# Patient Record
Sex: Male | Born: 1940
Health system: Southern US, Community
[De-identification: ages and names within clinical notes are randomized; demographics above are authoritative.]

## PROBLEM LIST (undated history)

## (undated) DIAGNOSIS — I350 Nonrheumatic aortic (valve) stenosis: Secondary | ICD-10-CM

## (undated) DIAGNOSIS — K219 Gastro-esophageal reflux disease without esophagitis: Secondary | ICD-10-CM

## (undated) DIAGNOSIS — J45909 Unspecified asthma, uncomplicated: Secondary | ICD-10-CM

## (undated) DIAGNOSIS — E785 Hyperlipidemia, unspecified: Secondary | ICD-10-CM

## (undated) DIAGNOSIS — G459 Transient cerebral ischemic attack, unspecified: Secondary | ICD-10-CM

## (undated) DIAGNOSIS — I2510 Atherosclerotic heart disease of native coronary artery without angina pectoris: Secondary | ICD-10-CM

## (undated) DIAGNOSIS — I10 Essential (primary) hypertension: Secondary | ICD-10-CM

## (undated) HISTORY — DX: Unspecified asthma, uncomplicated: J45.909

## (undated) HISTORY — DX: Nonrheumatic aortic (valve) stenosis: I35.0

## (undated) HISTORY — DX: Essential (primary) hypertension: I10

## (undated) HISTORY — PX: OTHER SURGICAL HISTORY: SHX169

## (undated) HISTORY — PX: ESOPHAGOGASTRODUODENOSCOPY: SHX1529

## (undated) HISTORY — DX: Atherosclerotic heart disease of native coronary artery without angina pectoris: I25.10

---

## 2002-03-27 ENCOUNTER — Encounter: Payer: Self-pay | Admitting: Specialist

## 2002-03-27 ENCOUNTER — Ambulatory Visit (HOSPITAL_COMMUNITY): Admission: RE | Admit: 2002-03-27 | Discharge: 2002-03-27 | Payer: Self-pay | Admitting: Specialist

## 2009-02-10 ENCOUNTER — Ambulatory Visit: Payer: Self-pay | Admitting: Cardiology

## 2009-02-11 ENCOUNTER — Encounter: Payer: Self-pay | Admitting: Cardiology

## 2009-02-12 ENCOUNTER — Ambulatory Visit: Payer: Self-pay | Admitting: Cardiology

## 2009-02-12 ENCOUNTER — Encounter: Payer: Self-pay | Admitting: Cardiology

## 2009-02-12 ENCOUNTER — Inpatient Hospital Stay (HOSPITAL_COMMUNITY): Admission: AD | Admit: 2009-02-12 | Discharge: 2009-02-13 | Payer: Self-pay | Admitting: Internal Medicine

## 2009-02-13 ENCOUNTER — Encounter: Payer: Self-pay | Admitting: Cardiology

## 2009-02-13 ENCOUNTER — Encounter: Payer: Self-pay | Admitting: Internal Medicine

## 2009-02-27 DIAGNOSIS — E785 Hyperlipidemia, unspecified: Secondary | ICD-10-CM | POA: Insufficient documentation

## 2009-02-27 DIAGNOSIS — I10 Essential (primary) hypertension: Secondary | ICD-10-CM | POA: Insufficient documentation

## 2009-02-27 DIAGNOSIS — R079 Chest pain, unspecified: Secondary | ICD-10-CM

## 2009-02-28 ENCOUNTER — Ambulatory Visit: Payer: Self-pay | Admitting: Cardiology

## 2010-02-25 NOTE — Assessment & Plan Note (Signed)
Summary: eph-per matt 2 wk fu dsch cone   Visit Type:  Follow-up Primary Provider:  Dr. Doreen Beam  CC:  chest pain.  History of Present Illness: Patient is seen post hospitalization.  I had seen him at [facility].  He had some chest discomfort.  His echo raised the question of a very slight wall motion abnormality.  Nuclear scan also raise the question of the possibility of a small inferior scar.  With these findings, decision was made to proceed with cardiac catheterization.  This was done at [facility] and there was no obstructive disease.  Patient does have mild plaque and some calcification of the LAD.  CT Scan was done to be completed to rule out pulmonary embolus.  There was some motion but no large pulmonary emboli were seen.  Patient is feeling well and he returns for post hospital followup.  His catheter site in his right leg is causing no difficulties.  Preventive Screening-Counseling & Management  Alcohol-Tobacco     Smoking Status: never  Current Medications (verified): 1)  Aspirin 81 Mg Tbec (Aspirin) .... Take One Tablet By Mouth Daily 2)  Amitriptyline Hcl 10 Mg Tabs (Amitriptyline Hcl) .... Take 1-3 Tablets By Mouth At Bedtime As Needed 3)  Lisinopril 20 Mg Tabs (Lisinopril) .... Take One Tablet By Mouth Daily 4)  Simvastatin 20 Mg Tabs (Simvastatin) .... Take One Tablet By Mouth Daily At Bedtime 5)  Multivitamins  Tabs (Multiple Vitamin) .... Take 1 Tablet By Mouth Once A Day 6)  Fish Oil 1200 Mg Caps (Omega-3 Fatty Acids) .... Take 1 Capsule By Mouth Once A Day  Allergies: 1)  ! Vancomycin  Comments:  Nurse/Medical Assistant: The patient's medications were reviewed with the patient's caretaker and were updated in the Medication List. Pt verbally confirmed medications.  Cyril Loosen, RN, BSN (February 28, 2009 12:37 PM)  Past History:  Past Medical History: Last updated: 02/28/2009 Chest pains...[facility]... nuclear.  January, 2011.Marland Kitchen artifact.. possible  inferior scar in period for ischemia.Marland Kitchen  /   [facility]....catheterization... February 12, 2009.. calcified LAD.Marland Kitchen. scattered nonobstructive disease EF 55%... echo... February 12, 2009.. mild hypokinesis inferior septal Aortic valve sclerosis.Marland Kitchen. echo... January, 2011 Hyperlipidemia Hypertension CT scan.. chest... February 13, 2009... image degraded by motion.... no large pulmonary emboli  Social History: Smoking Status:  never  Review of Systems       Patient denies fever, chills, headache, sweats, rash, change in vision, change in hearing, chest pain, cough, nausea vomiting, urinary symptoms, musculoskeletal problems.  All other systems are reviewed and are negative.  Vital Signs:  Patient profile:   70 year old male Height:      69 inches Weight:      218 pounds BMI:     32.31 Pulse rate:   62 / minute BP sitting:   151 / 82  (left arm) Cuff size:   regular  Vitals Entered By: Cyril Loosen, RN, BSN (February 28, 2009 12:34 PM)  Nutrition Counseling: Patient's BMI is greater than 25 and therefore counseled on weight management options.  CC: chest pain Comments Post hospital follow up   Physical Exam  General:  patient is stable in general today. Eyes:  no xanthelasma. Neck:  no jugular venous distention. Lungs:  lungs are clear  Effort is not labored. Heart:  cardiac exam reveals an S1-S2.  No clicks or significant murmurs or Abdomen:  abdomen soft. Extremities:  no peripheral edema. Psych:  patient is oriented to person time  and place.  Affect is normal.   Impression & Recommendations:  Problem # 1:  HYPERTENSION (ICD-401.9)  His updated medication list for this problem includes:    Aspirin 81 Mg Tbec (Aspirin) .Marland Kitchen... Take one tablet by mouth daily    Lisinopril 20 Mg Tabs (Lisinopril) .Marland Kitchen... Take one tablet by mouth daily Systolic blood pressure is mildly elevated today.  He will follow up with his primary physician.  Problem # 2:  CHEST PAIN UNSPECIFIED  (ICD-786.50)  His updated medication list for this problem includes:    Aspirin 81 Mg Tbec (Aspirin) .Marland Kitchen... Take one tablet by mouth daily    Lisinopril 20 Mg Tabs (Lisinopril) .Marland Kitchen... Take one tablet by mouth daily The chest pain that the patient had appears not to have been cardiac.  Further workup is not needed at this time.  He does have some mild plaquing and also some calcification of the LAD.  Aggressive secondary prevention will be appropriate and I discussed this with him.  Problem # 3:  HYPERLIPIDEMIA (ICD-272.4)  His updated medication list for this problem includes:    Simvastatin 20 Mg Tabs (Simvastatin) .Marland Kitchen... Take one tablet by mouth daily at bedtime Patient is on appropriate medications for his lipids. I have not scheduled followup for the patient.  If he develops any difficulties we be happy to see him at any time.  Patient Instructions: 1)  Your physician recommends that you continue on your current medications as directed. Please refer to the Current Medication list given to you today. 2)  No follow up needed.

## 2010-02-25 NOTE — Miscellaneous (Signed)
  Clinical Lists Changes  Observations: Added new observation of PAST MED HX: Chest pains...MM [facility]... nuclear.  January, 2011.Marland Kitchen artifact.. possible inferior scar in period for ischemia.Marland Kitchen  /   [facility]....catheterization... February 12, 2009.. calcified LAD.Marland Kitchen. scattered nonobstructive disease EF 55%... echo... February 12, 2009.. mild hypokinesis inferior septal Aortic valve sclerosis.Marland Kitchen. echo... January, 2011 Hyperlipidemia Hypertension CT scan.. chest... February 13, 2009... image degraded by motion.... no large pulmonary emboli (02/28/2009 8:24)       Past History:  Past Medical History: Chest pains...MM H... nuclear.  January, 2011.Marland Kitchen artifact.. possible inferior scar in period for ischemia.Marland Kitchen  /   [facility]....catheterization... February 12, 2009.. calcified LAD.Marland Kitchen. scattered nonobstructive disease EF 55%... echo... February 12, 2009.. mild hypokinesis inferior septal Aortic valve sclerosis.Marland Kitchen. echo... January, 2011 Hyperlipidemia Hypertension CT scan.. chest... February 13, 2009... image degraded by motion.... no large pulmonary emboli

## 2010-02-25 NOTE — Consult Note (Signed)
Summary: CARDIOLOGY CONSULT/ [facility]  CARDIOLOGY CONSULT/ [facility]   Imported By: Zachary George 02/28/2009 09:20:08  _____________________________________________________________________  External Attachment:    Type:   Image     Comment:   External Document

## 2010-04-14 LAB — BASIC METABOLIC PANEL
BUN: 8 mg/dL (ref 6–23)
Chloride: 106 mEq/L (ref 96–112)
GFR calc non Af Amer: >60 mL/min (ref >60)
Glucose, Bld: 143 mg/dL — ABNORMAL HIGH (ref 70–99)
Potassium: 4.2 mEq/L (ref 3.5–5.1)

## 2010-04-14 LAB — D-DIMER, QUANTITATIVE: D-Dimer, Quant: 1.23 ug/mL-FEU — ABNORMAL HIGH (ref 0.00–0.48)

## 2010-09-30 MED ORDER — SODIUM CHLORIDE 0.45 % IV SOLN
Freq: Once | INTRAVENOUS | Status: AC
  Administered 2010-10-01: 07:00:00 via INTRAVENOUS

## 2010-10-01 ENCOUNTER — Other Ambulatory Visit (INDEPENDENT_AMBULATORY_CARE_PROVIDER_SITE_OTHER): Payer: Self-pay | Admitting: Internal Medicine

## 2010-10-01 ENCOUNTER — Encounter (HOSPITAL_COMMUNITY): Payer: Self-pay | Admitting: *Deleted

## 2010-10-01 ENCOUNTER — Encounter (HOSPITAL_COMMUNITY)
Admission: RE | Disposition: A | Discharge status: Home / Self Care | Payer: Self-pay | Source: Ambulatory Visit | Attending: Internal Medicine

## 2010-10-01 ENCOUNTER — Encounter (INDEPENDENT_AMBULATORY_CARE_PROVIDER_SITE_OTHER): Payer: Self-pay | Admitting: Internal Medicine

## 2010-10-01 ENCOUNTER — Ambulatory Visit (HOSPITAL_COMMUNITY)
Admission: RE | Admit: 2010-10-01 | Discharge: 2010-10-01 | Disposition: A | Discharge status: Home / Self Care | Payer: Medicare Other | Source: Ambulatory Visit | Attending: Internal Medicine | Admitting: Internal Medicine

## 2010-10-01 DIAGNOSIS — Z7982 Long term (current) use of aspirin: Secondary | ICD-10-CM | POA: Insufficient documentation

## 2010-10-01 DIAGNOSIS — D126 Benign neoplasm of colon, unspecified: Secondary | ICD-10-CM | POA: Insufficient documentation

## 2010-10-01 DIAGNOSIS — K6389 Other specified diseases of intestine: Secondary | ICD-10-CM

## 2010-10-01 DIAGNOSIS — I10 Essential (primary) hypertension: Secondary | ICD-10-CM | POA: Insufficient documentation

## 2010-10-01 DIAGNOSIS — K5730 Diverticulosis of large intestine without perforation or abscess without bleeding: Secondary | ICD-10-CM | POA: Insufficient documentation

## 2010-10-01 DIAGNOSIS — Z79899 Other long term (current) drug therapy: Secondary | ICD-10-CM | POA: Insufficient documentation

## 2010-10-01 DIAGNOSIS — Z1211 Encounter for screening for malignant neoplasm of colon: Secondary | ICD-10-CM | POA: Insufficient documentation

## 2010-10-01 DIAGNOSIS — D175 Benign lipomatous neoplasm of intra-abdominal organs: Secondary | ICD-10-CM | POA: Insufficient documentation

## 2010-10-01 DIAGNOSIS — E785 Hyperlipidemia, unspecified: Secondary | ICD-10-CM | POA: Insufficient documentation

## 2010-10-01 HISTORY — PX: COLONOSCOPY: SHX5424

## 2010-10-01 HISTORY — DX: Hyperlipidemia, unspecified: E78.5

## 2010-10-01 HISTORY — DX: Essential (primary) hypertension: I10

## 2010-10-01 SURGERY — COLONOSCOPY
Anesthesia: Moderate Sedation

## 2010-10-01 MED ORDER — MEPERIDINE HCL 50 MG/ML IJ SOLN
INTRAMUSCULAR | Status: DC | PRN
  Administered 2010-10-01 (×2): 25 mg via INTRAVENOUS

## 2010-10-01 MED ORDER — MEPERIDINE HCL 50 MG/ML IJ SOLN
INTRAMUSCULAR | Status: AC
  Filled 2010-10-01: qty 1

## 2010-10-01 MED ORDER — MIDAZOLAM HCL 5 MG/5ML IJ SOLN
INTRAMUSCULAR | Status: AC
  Filled 2010-10-01: qty 10

## 2010-10-01 MED ORDER — STERILE WATER FOR IRRIGATION IR SOLN
Status: DC | PRN
  Administered 2010-10-01: 08:00:00

## 2010-10-01 MED ORDER — MIDAZOLAM HCL 5 MG/5ML IJ SOLN
INTRAMUSCULAR | Status: DC | PRN
  Administered 2010-10-01 (×2): 2 mg via INTRAVENOUS

## 2010-10-01 NOTE — Op Note (Signed)
COLONOSCOPY PROCEDURE REPORT  PATIENT:  Shane Osborne  MR#:  [id number] Birthdate:  08-Dec-1940, 70 y.o., male Endoscopist:  Dr. Malissa Hippo, MD Referred By:  Dub Mikes, MD Procedure Date: 10/01/2010  Procedure:   Colonoscopy  Indications:  Average risk screening colonoscopy. Last exam was 10 years ago.  Informed Consent: Procedure and risks were reviewed with the patient. Questions were answered and informed consent was obtained   Medications:  Demerol 50 mg IV Versed 4 mg IV  Description of procedure:  After a digital rectal exam was performed, that colonoscope was advanced from the anus through the rectum and colon to the area of the cecum, ileocecal valve and appendiceal orifice. The cecum was deeply intubated. These structures were well-seen and photographed for the record. From the level of the cecum and ileocecal valve, the scope was slowly and cautiously withdrawn. The mucosal surfaces were carefully surveyed utilizing scope tip to flexion to facilitate fold flattening as needed. The scope was pulled down into the rectum where a thorough exam including retroflexion was performed.  Findings:   Prep excellent. 5 mm polyp ablated via cold biopsy from the cecum 3 mm polyp ablated via cold biopsy from hepatic flexure. 2 small diverticula at transverse colon. 13 mm submucosal lipoma at the descending colon  Therapeutic/Diagnostic Maneuvers Performed:  See above  Complications:  None  Cecal Withdrawal Time:  16 minutes  Impression:  Examination performed to cecum. 2 small diverticula at transverse colon. 2 small polyps ablated via cold biopsy; one  from the cecum was 5 mm second one was 3 mm located athepatic flexure.  Submucosal lipoma at ascending colon.  Recommendations:  Standard  instructions given. High fiber diet. Physician will contact patient with results of biopsy.  Shane Osborne U  10/01/2010 8:06 AM  CC: Dr. Ignatius Specking., MD, MD & Dr. Bonnetta Barry ref. provider  found

## 2010-10-01 NOTE — H&P (Signed)
Shane Osborne is an 70 y.o. male.   Chief Complaint: Patient is here for colonoscopy. HPI: Patient is 70 year old Caucasian male who is here for screening colonoscopy. His last exam with 10 years ago and was normal. He denies abdominal pain recent change bowel habits. Family history is negative for colorectal carcinoma.  Past Medical History  Diagnosis Date  . Hypertension   . Hyperlipidemia     Past Surgical History  Procedure Date  . Right leg 20 years ago    X 8 secondary to fracture    History reviewed. No pertinent family history. Social History:  reports that he has never smoked. He does not have any smokeless tobacco history on file. His alcohol and drug histories not on file.  Allergies:  Allergies  Allergen Reactions  . Vancomycin     Medications Prior to Admission  Medication Dose Route Frequency Provider Last Rate Last Dose  . 0.45 % sodium chloride infusion   Intravenous Once Malissa Hippo, MD 20 mL/hr at 10/01/10 0718    . meperidine (DEMEROL) 50 MG/ML injection           . midazolam (VERSED) 5 MG/5ML injection            Medications Prior to Admission  Medication Sig Dispense Refill  . aspirin 81 MG tablet Take 81 mg by mouth daily.        Marland Kitchen lisinopril (PRINIVIL,ZESTRIL) 20 MG tablet Take 20 mg by mouth daily.        . simvastatin (ZOCOR) 20 MG tablet Take 20 mg by mouth at bedtime.          No results found for this or any previous visit (from the past 48 hour(s)). No results found.  Review of Systems  Constitutional: Negative for weight loss.  Gastrointestinal: Negative for abdominal pain, diarrhea, constipation, blood in stool and melena.    Blood pressure 143/82, pulse 87, temperature 97.7 F (36.5 C), temperature source Oral, resp. rate 18, height 5' 9.5" (1.765 m), weight 220 lb (99.791 kg), SpO2 95.00%. Physical Exam  Constitutional: He appears well-developed and well-nourished.  HENT:  Mouth/Throat: Oropharynx is clear and moist.  Eyes:  Conjunctivae are normal. No scleral icterus.  Neck: No thyromegaly present.  Cardiovascular: Normal rate, regular rhythm and normal heart sounds.   No murmur heard. Respiratory: Effort normal and breath sounds normal.  GI: Soft. He exhibits no distension and no mass. There is no tenderness.  Musculoskeletal: He exhibits no edema.  Lymphadenopathy:    He has no cervical adenopathy.  Neurological: He is alert.  Skin: Skin is warm and dry.     Assessment/Plan Average risk screening colonoscopy.  REHMAN,NAJEEB U 10/01/2010, 7:35 AM

## 2010-10-08 ENCOUNTER — Encounter (HOSPITAL_COMMUNITY): Payer: Self-pay | Admitting: Internal Medicine

## 2010-10-17 ENCOUNTER — Encounter (INDEPENDENT_AMBULATORY_CARE_PROVIDER_SITE_OTHER): Payer: Self-pay | Admitting: *Deleted

## 2013-08-22 ENCOUNTER — Ambulatory Visit (INDEPENDENT_AMBULATORY_CARE_PROVIDER_SITE_OTHER): Payer: Medicare Other | Admitting: Internal Medicine

## 2013-08-22 ENCOUNTER — Encounter (INDEPENDENT_AMBULATORY_CARE_PROVIDER_SITE_OTHER): Payer: Self-pay | Admitting: Internal Medicine

## 2013-08-22 ENCOUNTER — Other Ambulatory Visit (INDEPENDENT_AMBULATORY_CARE_PROVIDER_SITE_OTHER): Payer: Self-pay | Admitting: *Deleted

## 2013-08-22 ENCOUNTER — Telehealth (INDEPENDENT_AMBULATORY_CARE_PROVIDER_SITE_OTHER): Payer: Self-pay | Admitting: *Deleted

## 2013-08-22 VITALS — BP 132/70 | HR 60 | Temp 97.8°F | Ht 69.5 in | Wt 223.5 lb

## 2013-08-22 DIAGNOSIS — R195 Other fecal abnormalities: Secondary | ICD-10-CM | POA: Insufficient documentation

## 2013-08-22 DIAGNOSIS — Z1211 Encounter for screening for malignant neoplasm of colon: Secondary | ICD-10-CM

## 2013-08-22 NOTE — Patient Instructions (Signed)
Colonoscopy.  The risks and benefits such as perforation, bleeding, and infection were reviewed with the patient and is agreeable. 

## 2013-08-22 NOTE — Progress Notes (Signed)
Subjective:     Patient ID: Shane Osborne, male   DOB: September 25, 1940, 73 y.o.   MRN: [id number]  HPI Referred to our office for a positive stool card per Dr. Woody Seller. 08/09/2013. Patient denies seeing any blood. No abdominal pain.  He did have some constipation before his hernia surgery in May Appetite is good. No weight loss.  Has a BM daily. No melena or BRRB.   08/09/2013 H and H 14.7 and 44.2, MCV 94, platelet 272  10/01/2010 Colonoscopy:  Findings:  Prep excellent.  5 mm polyp ablated via cold biopsy from the cecum  3 mm polyp ablated via cold biopsy from hepatic flexure.  2 small diverticula at transverse colon.  13 mm submucosal lipoma at the descending colon   Notes Recorded by Rogene Houston, MD on 10/12/2010 at 12:11 PM Cecal polyp is an adenoma and the other one is not. I have reviewed results the patient's wife; He can wait 7 years before his next colonoscopy.   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    Past Medical History  Diagnosis Date  . Hypertension   . Hyperlipidemia     Past Surgical History  Procedure Laterality Date  . Right leg  20 years ago    X 8 secondary to fracture  . Colonoscopy  10/01/2010    Procedure: COLONOSCOPY;  Surgeon: Rogene Houston, MD;  Location: [facility];  Service: Endoscopy;  Laterality: N/A;  . Rt inguinal       hernia repair 2 months ago    Allergies  Allergen Reactions  . Vancomycin     Current Outpatient Prescriptions on File Prior to Visit  Medication Sig Dispense Refill  . aspirin 81 MG tablet Take 81 mg by mouth daily.        Marland Kitchen lisinopril (PRINIVIL,ZESTRIL) 20 MG tablet Take 10 mg by mouth daily.       . simvastatin (ZOCOR) 20 MG tablet Take 10 mg by mouth at bedtime.        No current facility-administered medications on file prior to visit.     Objective:   Physical Exam Filed Vitals:   08/22/13 1037  BP: 132/70  Pulse: 60  Temp: 97.8 F (36.6 C)  Height: 5' 9.5" (1.765 m)  Weight: 223  lb 8 oz (101.379 kg)   Alert and oriented. Skin warm and dry. Oral mucosa is moist.   . Sclera anicteric, conjunctivae is pink. Thyroid not enlarged. No cervical lymphadenopathy. Lungs clear. Heart regular rate and rhythm.  Abdomen is soft. Bowel sounds are positive. No hepatomegaly. No abdominal masses felt. No tenderness.  No edema to lower extremities.  Stool brown and guaiac negative.     Assessment:     Heme positive stool. I discussed with Dr. Laural Golden. Colonic neoplasm needs to be ruled out.    Plan:     Colonoscopy.The risks and benefits such as perforation, bleeding, and infection were reviewed with the patient and is agreeable.

## 2013-08-22 NOTE — Telephone Encounter (Signed)
Patient needs movi prep 

## 2013-08-23 MED ORDER — PEG-KCL-NACL-NASULF-NA ASC-C 100 G PO SOLR: 1.0000 | Freq: Once | ORAL | Status: DC

## 2013-08-25 ENCOUNTER — Encounter (HOSPITAL_COMMUNITY): Payer: Self-pay | Admitting: Pharmacy Technician

## 2013-08-28 ENCOUNTER — Telehealth (INDEPENDENT_AMBULATORY_CARE_PROVIDER_SITE_OTHER): Payer: Self-pay | Admitting: *Deleted

## 2013-08-28 DIAGNOSIS — Z1211 Encounter for screening for malignant neoplasm of colon: Secondary | ICD-10-CM

## 2013-08-28 NOTE — Telephone Encounter (Signed)
Patient states pharmacy didn't get it

## 2013-08-29 MED ORDER — PEG-KCL-NACL-NASULF-NA ASC-C 100 G PO SOLR: 1.0000 | Freq: Once | ORAL | Status: DC

## 2013-09-01 ENCOUNTER — Encounter (HOSPITAL_COMMUNITY)
Admission: RE | Disposition: A | Discharge status: Home / Self Care | Payer: Self-pay | Source: Ambulatory Visit | Attending: Internal Medicine

## 2013-09-01 ENCOUNTER — Ambulatory Visit (HOSPITAL_COMMUNITY)
Admission: RE | Admit: 2013-09-01 | Discharge: 2013-09-01 | Disposition: A | Discharge status: Home / Self Care | Payer: Medicare Other | Source: Ambulatory Visit | Attending: Internal Medicine | Admitting: Internal Medicine

## 2013-09-01 ENCOUNTER — Encounter (HOSPITAL_COMMUNITY): Payer: Self-pay | Admitting: *Deleted

## 2013-09-01 DIAGNOSIS — Z860100 Personal history of colon polyps, unspecified: Secondary | ICD-10-CM | POA: Insufficient documentation

## 2013-09-01 DIAGNOSIS — E785 Hyperlipidemia, unspecified: Secondary | ICD-10-CM | POA: Insufficient documentation

## 2013-09-01 DIAGNOSIS — Z79899 Other long term (current) drug therapy: Secondary | ICD-10-CM | POA: Insufficient documentation

## 2013-09-01 DIAGNOSIS — K921 Melena: Secondary | ICD-10-CM

## 2013-09-01 DIAGNOSIS — R195 Other fecal abnormalities: Secondary | ICD-10-CM

## 2013-09-01 DIAGNOSIS — D126 Benign neoplasm of colon, unspecified: Secondary | ICD-10-CM | POA: Insufficient documentation

## 2013-09-01 DIAGNOSIS — Z7982 Long term (current) use of aspirin: Secondary | ICD-10-CM | POA: Insufficient documentation

## 2013-09-01 DIAGNOSIS — Z881 Allergy status to other antibiotic agents status: Secondary | ICD-10-CM | POA: Diagnosis not present

## 2013-09-01 DIAGNOSIS — D175 Benign lipomatous neoplasm of intra-abdominal organs: Secondary | ICD-10-CM | POA: Diagnosis not present

## 2013-09-01 DIAGNOSIS — I10 Essential (primary) hypertension: Secondary | ICD-10-CM | POA: Diagnosis not present

## 2013-09-01 DIAGNOSIS — K644 Residual hemorrhoidal skin tags: Secondary | ICD-10-CM | POA: Insufficient documentation

## 2013-09-01 DIAGNOSIS — K5730 Diverticulosis of large intestine without perforation or abscess without bleeding: Secondary | ICD-10-CM | POA: Diagnosis not present

## 2013-09-01 HISTORY — PX: COLONOSCOPY: SHX5424

## 2013-09-01 SURGERY — COLONOSCOPY
Anesthesia: Moderate Sedation

## 2013-09-01 MED ORDER — STERILE WATER FOR IRRIGATION IR SOLN
Status: DC | PRN
  Administered 2013-09-01: 09:00:00

## 2013-09-01 MED ORDER — MEPERIDINE HCL 50 MG/ML IJ SOLN
INTRAMUSCULAR | Status: AC
  Filled 2013-09-01: qty 1

## 2013-09-01 MED ORDER — MEPERIDINE HCL 50 MG/ML IJ SOLN
INTRAMUSCULAR | Status: DC | PRN
  Administered 2013-09-01 (×2): 25 mg via INTRAVENOUS

## 2013-09-01 MED ORDER — MIDAZOLAM HCL 5 MG/5ML IJ SOLN
INTRAMUSCULAR | Status: AC
  Filled 2013-09-01: qty 10

## 2013-09-01 MED ORDER — SODIUM CHLORIDE 0.9 % IV SOLN
INTRAVENOUS | Status: DC
  Administered 2013-09-01: 09:00:00 via INTRAVENOUS

## 2013-09-01 MED ORDER — MIDAZOLAM HCL 5 MG/5ML IJ SOLN
INTRAMUSCULAR | Status: DC | PRN
  Administered 2013-09-01: 1 mg via INTRAVENOUS
  Administered 2013-09-01 (×3): 2 mg via INTRAVENOUS

## 2013-09-01 NOTE — H&P (Signed)
Shane Osborne is an 73 y.o. male.   Chief Complaint: Patient is here for colonoscopy. HPI: Patient is 73 year old Caucasian male with history of colonic adenoma and was noted to have heme-positive stool by his primary care physician Dr. Woody Osborne. He denies abdominal pain change in bowel habits melena or rectal bleeding. He does not take OTC NSAIDs. He had right inguinal herniorrhaphy two months ago which was uneventful. Family history is negative for CRC for  Past Medical History  Diagnosis Date  . Hypertension   . Hyperlipidemia     Past Surgical History  Procedure Laterality Date  . Right leg  20 years ago    X 8 secondary to fracture  . Colonoscopy  10/01/2010    Procedure: COLONOSCOPY;  Surgeon: Shane Houston, MD;  Location: [facility];  Service: Endoscopy;  Laterality: N/A;  . Rt inguinal       hernia repair 2 months ago    History reviewed. No pertinent family history. Social History:  reports that he has never smoked. He does not have any smokeless tobacco history on file. He reports that he drinks about 3 ounces of alcohol per week. His drug history is not on file.  Allergies:  Allergies  Allergen Reactions  . Vancomycin     Medications Prior to Admission  Medication Sig Dispense Refill  . aspirin 81 MG tablet Take 81 mg by mouth daily.        Marland Kitchen lisinopril (PRINIVIL,ZESTRIL) 20 MG tablet Take 10 mg by mouth daily.       . Multiple Vitamin (MULTIVITAMIN) tablet Take 1 tablet by mouth daily.      . Omega-3 Fatty Acids (FISH OIL) 1200 MG CAPS Take by mouth 2 (two) times daily before a meal.      . peg 3350 powder (MOVIPREP) 100 G SOLR Take 1 kit (200 g total) by mouth once.  1 kit  0  . simvastatin (ZOCOR) 20 MG tablet Take 10 mg by mouth at bedtime.         No results found for this or any previous visit (from the past 48 hour(s)). No results found.  ROS  Blood pressure 158/122, pulse 88, temperature 98 F (36.7 C), resp. rate 13, SpO2 97.00%. Physical Exam   Constitutional: He appears well-developed and well-nourished.  HENT:  Mouth/Throat: Oropharynx is clear and moist.  Eyes: Conjunctivae are normal. No scleral icterus.  Neck: No thyromegaly present.  Cardiovascular: Normal rate, regular rhythm and normal heart sounds.   No murmur heard. Respiratory: Effort normal and breath sounds normal.  GI: Soft. He exhibits no distension. There is no tenderness.  Musculoskeletal: He exhibits no edema.  Lymphadenopathy:    He has no cervical adenopathy.  Neurological: He is alert.  Skin: Skin is warm and dry.     Assessment/Plan Heme-positive stool. History of colonic adenoma. Diagnostic colonoscopy.  ShaneNAJEEB Osborne 09/01/2013, 9:30 AM

## 2013-09-01 NOTE — Discharge Instructions (Signed)
Resume usual medications and diet. °No driving for 24 hours. °Physician will call with biopsy results. ° °Colonoscopy, Care After °Refer to this sheet in the next few weeks. These instructions provide you with information on caring for yourself after your procedure. Your health care provider may also give you more specific instructions. Your treatment has been planned according to current medical practices, but problems sometimes occur. Call your health care provider if you have any problems or questions after your procedure. °WHAT TO EXPECT AFTER THE PROCEDURE  °After your procedure, it is typical to have the following: °· A small amount of blood in your stool. °· Moderate amounts of gas and mild abdominal cramping or bloating. °HOME CARE INSTRUCTIONS °· Do not drive, operate machinery, or sign important documents for 24 hours. °· You may shower and resume your regular physical activities, but move at a slower pace for the first 24 hours. °· Take frequent rest periods for the first 24 hours. °· Walk around or put a warm pack on your abdomen to help reduce abdominal cramping and bloating. °· Drink enough fluids to keep your urine clear or pale yellow. °· You may resume your normal diet as instructed by your health care provider. Avoid heavy or fried foods that are hard to digest. °· Avoid drinking alcohol for 24 hours or as instructed by your health care provider. °· Only take over-the-counter or prescription medicines as directed by your health care provider. °· If a tissue sample (biopsy) was taken during your procedure: °¨ Do not take aspirin or blood thinners for 7 days, or as instructed by your health care provider. °¨ Do not drink alcohol for 7 days, or as instructed by your health care provider. °¨ Eat soft foods for the first 24 hours. °SEEK MEDICAL CARE IF: °You have persistent spotting of blood in your stool 2-3 days after the procedure. °SEEK IMMEDIATE MEDICAL CARE IF: °· You have more than a small  spotting of blood in your stool. °· You pass large blood clots in your stool. °· Your abdomen is swollen (distended). °· You have nausea or vomiting. °· You have a fever. °· You have increasing abdominal pain that is not relieved with medicine. °Document Released: 08/27/2003 Document Revised: 11/02/2012 Document Reviewed: 09/19/2012 °Exit[employer]® Patient Information ©2015 [employer]. This information is not intended to replace advice given to you by your health care provider. Make sure you discuss any questions you have with your health care provider. ° °

## 2013-09-01 NOTE — Op Note (Addendum)
COLONOSCOPY PROCEDURE REPORT  PATIENT:  Shane Osborne  MR#:  [id number] Birthdate:  [birth year], 73 y.o., male Endoscopist:  Dr. Rogene Houston, MD Referred By:  Dr. Glenda Chroman, MD Procedure Date: 09/01/2013  Procedure:   Colonoscopy  Indications: The patient is 73 year old Caucasian male who was recently found to have heme-positive stools. His hemoglobin was normal. He has history of colonic adenomas and  his last exam was in September, 2012. Family history is negative for CRC. He is undergoing diagnostic colonoscopy.  Informed Consent:  The procedure and risks were reviewed with the patient and informed consent was obtained.  Medications:  Demerol 50 mg IV Versed 7 mg IV  Description of procedure:  After a digital rectal exam was performed, that colonoscope was advanced from the anus through the rectum and colon to the area of the cecum, ileocecal valve and appendiceal orifice. The cecum was deeply intubated. These structures were well-seen and photographed for the record. From the level of the cecum and ileocecal valve, the scope was slowly and cautiously withdrawn. The mucosal surfaces were carefully surveyed utilizing scope tip to flexion to facilitate fold flattening as needed. The scope was pulled down into the rectum where a thorough exam including retroflexion was performed.  Findings:   Prep excellent. Three small polyps removed via cold biopsy and submitted together(cecum, hepatic flexure and sigmoid colon). Two small diverticula noted one was at the hepatic flexure and the other one at splenic flexure. Normal rectal mucosa. 13 mm submucosal lipoma and descending colon. Small hemorrhoids below the dentate line.   Therapeutic/Diagnostic Maneuvers Performed:  See above  Complications:  None  Cecal Withdrawal Time:  11 minutes  Impression:  Examination performed to cecum. Three small polyps removed via cold  biopsy and submitted together(cecum, hepatic flexure and  sigmoid colon). Two small diverticula noted(hepatic and splenic flexure). Submucosal lipoma at descending colon stable since last exam. Small external hemorrhoids.  Recommendations:  Standard instructions given. I will contact patient with biopsy results and further recommendations but he could definitely wait 5 years before his next colonoscopy. He will have H&H by Dr. Woody Seller in 3-4 months.  Rukia Mcgillivray U  09/01/2013 10:09 AM  CC: Dr. Glenda Chroman., MD & Dr. Rayne Du ref. provider found

## 2013-09-07 ENCOUNTER — Encounter (HOSPITAL_COMMUNITY): Payer: Self-pay | Admitting: Internal Medicine

## 2013-09-12 ENCOUNTER — Encounter (INDEPENDENT_AMBULATORY_CARE_PROVIDER_SITE_OTHER): Payer: Self-pay | Admitting: *Deleted

## 2014-01-12 ENCOUNTER — Encounter (INDEPENDENT_AMBULATORY_CARE_PROVIDER_SITE_OTHER): Payer: Self-pay

## 2014-03-14 ENCOUNTER — Encounter (INDEPENDENT_AMBULATORY_CARE_PROVIDER_SITE_OTHER): Payer: Self-pay | Admitting: *Deleted

## 2014-03-14 ENCOUNTER — Other Ambulatory Visit (INDEPENDENT_AMBULATORY_CARE_PROVIDER_SITE_OTHER): Payer: Self-pay | Admitting: *Deleted

## 2014-03-14 ENCOUNTER — Ambulatory Visit (INDEPENDENT_AMBULATORY_CARE_PROVIDER_SITE_OTHER): Payer: Medicare Other | Admitting: Internal Medicine

## 2014-03-14 ENCOUNTER — Encounter (INDEPENDENT_AMBULATORY_CARE_PROVIDER_SITE_OTHER): Payer: Self-pay | Admitting: Internal Medicine

## 2014-03-14 VITALS — BP 116/62 | HR 64 | Temp 97.8°F | Ht 69.5 in | Wt 222.0 lb

## 2014-03-14 DIAGNOSIS — G8929 Other chronic pain: Secondary | ICD-10-CM

## 2014-03-14 DIAGNOSIS — R110 Nausea: Secondary | ICD-10-CM

## 2014-03-14 DIAGNOSIS — R1013 Epigastric pain: Principal | ICD-10-CM

## 2014-03-14 DIAGNOSIS — R1010 Upper abdominal pain, unspecified: Secondary | ICD-10-CM

## 2014-03-14 NOTE — Progress Notes (Signed)
Subjective:    Patient ID: Shane Osborne, male    DOB: Nov 16, 1940, 74 y.o.   MRN: [id number]  HPI Presents today with c/o nausea. He says he has an uncomfortable feeling in his epigastric region. There has been no vomiting. Symptoms are really not related to eating. Symptoms occur usually in the morning and may last every day. Occurs about every day. Has tried Prilosec which really did not help.  Spicy foods really do not bother him. He denies bloating but he says his wife says he looks bloated. Symptoms for about a year. Appetite is okay. No weight loss. BMs are normal. No melena or BRRB. Walks every day. 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 His last colonoscopy was in August of last year.      09/01/2013 Colonoscopy  Indications: The patient is 74 year old Caucasian male who was recently found to have heme-positive stools. His hemoglobin was normal. He has history of colonic adenomas and his last exam was in September, 2012. Family history is negative for CRC. He is undergoing diagnostic colonoscopy. Impression:  Examination performed to cecum. Three small polyps removed via cold biopsy and submitted together(cecum, hepatic flexure and sigmoid colon). Two small diverticula noted(hepatic and splenic flexure). Submucosal lipoma at descending colon stable since last exam. Small external hemorrhoids. Notes Recorded by Rogene Houston, MD on 09/05/2013 at 1:41 PM Patient had three small polyps removed and only one is tubular adenoma Results given to patient Next colonoscopy in 5 years    Review of Systems  Married. Retired Administrator.   Past Medical History  Diagnosis Date  . Hypertension   . Hyperlipidemia     Past Surgical History  Procedure Laterality Date  . Right leg  20 years ago    X 8 secondary to fracture  . Colonoscopy  10/01/2010    Procedure: COLONOSCOPY;  Surgeon: Rogene Houston, MD;  [facility];  Service: Endoscopy;   Laterality: N/A;  . Rt inguinal       hernia repair 2 months ago  . Colonoscopy N/A 09/01/2013    Procedure: COLONOSCOPY;  Surgeon: Rogene Houston, MD;  Location: [facility];  Service: Endoscopy;  Laterality: N/A;  940    Allergies  Allergen Reactions  . Vancomycin     Current Outpatient Prescriptions on File Prior to Visit  Medication Sig Dispense Refill  . aspirin 81 MG tablet Take 81 mg by mouth daily.      Marland Kitchen lisinopril (PRINIVIL,ZESTRIL) 20 MG tablet Take 10 mg by mouth daily.     . Multiple Vitamin (MULTIVITAMIN) tablet Take 1 tablet by mouth daily.    . Omega-3 Fatty Acids (FISH OIL) 1200 MG CAPS Take by mouth 2 (two) times daily before a meal.    . simvastatin (ZOCOR) 20 MG tablet Take 10 mg by mouth at bedtime.      No current facility-administered medications on file prior to visit.        Objective:   Physical Exam  Filed Vitals:   03/14/14 0834  Height: 5' 9.5" (1.765 m)  Weight: 222 lb (100.699 kg)   Alert and oriented. Skin warm and dry. Oral mucosa is moist.   . Sclera anicteric, conjunctivae is pink. Thyroid not enlarged. No cervical lymphadenopathy. Lungs clear. Heart regular rate and rhythm.  Abdomen is soft. Bowel sounds are positive. No hepatomegaly. No abdominal masses felt. No abdominal tenderness.  No edema to lower extremities.  Assessment & Plan:  Epigastric pain, nausea. PUD needs to be ruled out.  If EGD normal, may need CT abdomen.

## 2014-03-14 NOTE — Patient Instructions (Signed)
EGD. The risks and benefits such as perforation, bleeding, and infection were reviewed with the patient and is agreeable. 

## 2014-03-16 ENCOUNTER — Encounter (INDEPENDENT_AMBULATORY_CARE_PROVIDER_SITE_OTHER): Payer: Self-pay | Admitting: *Deleted

## 2014-04-11 ENCOUNTER — Encounter (HOSPITAL_COMMUNITY)
Admission: RE | Disposition: A | Discharge status: Home / Self Care | Payer: Self-pay | Source: Ambulatory Visit | Attending: Internal Medicine

## 2014-04-11 ENCOUNTER — Ambulatory Visit (HOSPITAL_COMMUNITY)
Admission: RE | Admit: 2014-04-11 | Discharge: 2014-04-11 | Disposition: A | Discharge status: Home / Self Care | Payer: Medicare Other | Source: Ambulatory Visit | Attending: Internal Medicine | Admitting: Internal Medicine

## 2014-04-11 ENCOUNTER — Encounter (HOSPITAL_COMMUNITY): Payer: Self-pay | Admitting: *Deleted

## 2014-04-11 DIAGNOSIS — Z7982 Long term (current) use of aspirin: Secondary | ICD-10-CM | POA: Insufficient documentation

## 2014-04-11 DIAGNOSIS — K2210 Ulcer of esophagus without bleeding: Secondary | ICD-10-CM | POA: Insufficient documentation

## 2014-04-11 DIAGNOSIS — K8020 Calculus of gallbladder without cholecystitis without obstruction: Secondary | ICD-10-CM | POA: Diagnosis not present

## 2014-04-11 DIAGNOSIS — K2900 Acute gastritis without bleeding: Secondary | ICD-10-CM | POA: Diagnosis not present

## 2014-04-11 DIAGNOSIS — K2960 Other gastritis without bleeding: Secondary | ICD-10-CM | POA: Insufficient documentation

## 2014-04-11 DIAGNOSIS — R1013 Epigastric pain: Secondary | ICD-10-CM | POA: Diagnosis not present

## 2014-04-11 DIAGNOSIS — Z79899 Other long term (current) drug therapy: Secondary | ICD-10-CM | POA: Insufficient documentation

## 2014-04-11 DIAGNOSIS — G8929 Other chronic pain: Secondary | ICD-10-CM

## 2014-04-11 DIAGNOSIS — I10 Essential (primary) hypertension: Secondary | ICD-10-CM | POA: Diagnosis not present

## 2014-04-11 DIAGNOSIS — R110 Nausea: Secondary | ICD-10-CM

## 2014-04-11 DIAGNOSIS — E785 Hyperlipidemia, unspecified: Secondary | ICD-10-CM | POA: Insufficient documentation

## 2014-04-11 HISTORY — PX: ESOPHAGOGASTRODUODENOSCOPY: SHX5428

## 2014-04-11 HISTORY — DX: Gastro-esophageal reflux disease without esophagitis: K21.9

## 2014-04-11 SURGERY — EGD (ESOPHAGOGASTRODUODENOSCOPY)
Anesthesia: Moderate Sedation

## 2014-04-11 MED ORDER — MEPERIDINE HCL 50 MG/ML IJ SOLN
INTRAMUSCULAR | Status: AC
  Filled 2014-04-11: qty 1

## 2014-04-11 MED ORDER — BUTAMBEN-TETRACAINE-BENZOCAINE 2-2-14 % EX AERO
INHALATION_SPRAY | CUTANEOUS | Status: DC | PRN
  Administered 2014-04-11: 2 via TOPICAL

## 2014-04-11 MED ORDER — MIDAZOLAM HCL 5 MG/5ML IJ SOLN
INTRAMUSCULAR | Status: DC | PRN
  Administered 2014-04-11 (×2): 2 mg via INTRAVENOUS
  Administered 2014-04-11: 1 mg via INTRAVENOUS

## 2014-04-11 MED ORDER — MEPERIDINE HCL 50 MG/ML IJ SOLN
INTRAMUSCULAR | Status: DC | PRN
  Administered 2014-04-11 (×2): 25 mg via INTRAVENOUS

## 2014-04-11 MED ORDER — SUCRALFATE 1 G PO TABS: 1.0000 g | ORAL_TABLET | Freq: Three times a day (TID) | ORAL | Status: DC

## 2014-04-11 MED ORDER — SODIUM CHLORIDE 0.9 % IV SOLN
INTRAVENOUS | Status: DC
  Administered 2014-04-11: 12:00:00 via INTRAVENOUS

## 2014-04-11 MED ORDER — STERILE WATER FOR IRRIGATION IR SOLN
Status: DC | PRN
  Administered 2014-04-11: 12:00:00

## 2014-04-11 MED ORDER — MIDAZOLAM HCL 5 MG/5ML IJ SOLN
INTRAMUSCULAR | Status: AC
  Filled 2014-04-11: qty 10

## 2014-04-11 NOTE — Discharge Instructions (Signed)
Resume usual medications and diet. Sucralfate 1 g by mouth 30-60 minutes before each meal and at bedtime. No driving for 24 hours. Physician will call with results of CLO test. Keep symptom diary for the next 2 weeks. \   Esophagogastroduodenoscopy Care After Refer to this sheet in the next few weeks. These instructions provide you with information on caring for yourself after your procedure. Your caregiver may also give you more specific instructions. Your treatment has been planned according to current medical practices, but problems sometimes occur. Call your caregiver if you have any problems or questions after your procedure.  HOME CARE INSTRUCTIONS  Do not eat or drink anything until the numbing medicine (local anesthetic) has worn off and your gag reflex has returned. You will know that the local anesthetic has worn off when you can swallow comfortably.  Do not drive for 12 hours after the procedure or as directed by your caregiver.  Only take medicines as directed by your caregiver. SEEK MEDICAL CARE IF:   You cannot stop coughing.  You are not urinating at all or less than usual. SEEK IMMEDIATE MEDICAL CARE IF:  You have difficulty swallowing.  You cannot eat or drink.  You have worsening throat or chest pain.  You have dizziness, lightheadedness, or you faint.  You have nausea or vomiting.  You have chills.  You have a fever.  You have severe abdominal pain.  You have black, tarry, or bloody stools. Document Released: 12/30/2011 Document Reviewed: 12/30/2011 [facility] Patient Information 2015 [employer]. This information is not intended to replace advice given to you by your health care provider. Make sure you discuss any questions you have with your health care provider.

## 2014-04-11 NOTE — Op Note (Addendum)
EGD PROCEDURE REPORT  PATIENT:  Shane Osborne  MR#:  [id number] Birthdate:  1941/01/03, 74 y.o., male Endoscopist:  Dr. Rogene Houston, MD Referred By:  Dr. Glenda Chroman, MD Procedure Date: 04/11/2014  Procedure:   EGD  Indications:  Patient is 74 year old Caucasian male who presents with over 6 month history of intermittent epigastric pain and nausea. He says both symptoms of mild any sort of gotten used to them. He denies vomiting anorexia weight loss. He has known cholelithiasis and his symptoms are not felt to be typical of GB disease.             Informed Consent:  The risks, benefits, alternatives & imponderables which include, but are not limited to, bleeding, infection, perforation, drug reaction and potential missed lesion have been reviewed.  The potential for biopsy, lesion removal, esophageal dilation, etc. have also been discussed.  Questions have been answered.  All parties agreeable.  Please see history & physical in medical record for more information.  Medications:  Demerol 50 mg IV Versed 5 mg IV Cetacaine spray topically for oropharyngeal anesthesia  Description of procedure:  The endoscope was introduced through the mouth and advanced to the second portion of the duodenum without difficulty or limitations. The mucosal surfaces were surveyed very carefully during advancement of the scope and upon withdrawal.  Findings:  Esophagus:  Mucosa of the esophagus was normal except single linear erosion at distal esophagus extending to GE junction. Tiny island of ectopic gastric mucosa noted proximal to GE junction measuring no more than 3 mm in diameter. GEJ:  40cm Stomach:  There was some bile in the stomach but no food debris. Stomach distended very well with insufflation. Folds in the proximal stomach were normal. Examination mucosa gastric body was normal. There was patchy and linear erythema and edema to gastric mucosa. Pyloric channel was patent. In the last fundus and  cardia were examined by retroflexion of scope and were normal. Duodenum:  Normal bulbar and post bulbar mucosa.  Therapeutic/Diagnostic Maneuvers Performed:  Antral biopsy taken for CLO test.  Complications:  None  Impression: Single small erosion at distal esophagus/GE junction. Nonerosive antral gastritis. Antral biopsy taken for CLO test. No evidence of peptic ulcer disease.  Comment; Source of patient's symptoms remain unclear.  Recommendations:  Patient advised to keep symptom diary over the next couple weeks. Continue omeprazole at 20 mg by mouth every morning. Sucralfate 1 g by mouth before meals and daily at bedtime. I will be contacting patient results of CLO test  REHMAN,NAJEEB U  04/11/2014  12:57 PM  CC: Dr. Glenda Chroman., MD & Dr. Rayne Du ref. provider found

## 2014-04-11 NOTE — H&P (Signed)
Shane Osborne is an 74 y.o. male.   Chief Complaint: Patient is here for EGD. HPI: Patient is 74 year old Caucasian male who presents with over 6 month history of mild epigastric pain as well as nausea. He was treated with omeprazole (not on any difference. He denies heartburn vomiting dysphagia melena or rectal bleeding. He is not sure if his symptoms are triggered by meals or when he is fasting. His appetite is normal and he has not lost any weight recently. He is on low-dose aspirin but does not take other OTC NSAIDs. He has history of cholelithiasis dating back to 2011. He had normal CBC serum calcium and LFTs back in July 2015.   Past Medical History  Diagnosis Date  . Hypertension   . Hyperlipidemia   .      Past Surgical History  Procedure Laterality Date  . Right leg  20 years ago    X 8 secondary to fracture  . Colonoscopy  10/01/2010    Procedure: COLONOSCOPY;  Surgeon: Rogene Houston, MD;  [facility];  Service: Endoscopy;  Laterality: N/A;  . Rt inguinal       hernia repair 2 months ago  . Colonoscopy N/A 09/01/2013    Procedure: COLONOSCOPY;  Surgeon: Rogene Houston, MD;  [facility];  Service: Endoscopy;  Laterality: N/A;  940  . Esophagogastroduodenoscopy      History reviewed. No pertinent family history. Social History:  reports that he has never smoked. He does not have any smokeless tobacco history on file. He reports that he drinks about 3.0 oz of alcohol per week. He reports that he does not use illicit drugs.  Allergies:  Allergies  Allergen Reactions  . Vancomycin     Medications Prior to Admission  Medication Sig Dispense Refill  . aspirin 81 MG tablet Take 81 mg by mouth daily.      Marland Kitchen lisinopril (PRINIVIL,ZESTRIL) 20 MG tablet Take 10 mg by mouth daily.     . magnesium 30 MG tablet Take 30 mg by mouth 2 (two) times daily.    . metoprolol tartrate (LOPRESSOR) 25 MG tablet Take 25 mg by mouth daily.    . Multiple Vitamin  (MULTIVITAMIN) tablet Take 1 tablet by mouth daily.    . Omega-3 Fatty Acids (FISH OIL) 1200 MG CAPS Take by mouth 2 (two) times daily before a meal.    . simvastatin (ZOCOR) 20 MG tablet Take 10 mg by mouth at bedtime.     Marland Kitchen omeprazole (PRILOSEC) 20 MG capsule Take 20 mg by mouth daily.      No results found for this or any previous visit (from the past 48 hour(s)). No results found.  ROS  Blood pressure 133/66, pulse 51, temperature 97.8 F (36.6 C), temperature source Oral, height 5\' 9"  (1.753 m), weight 222 lb (100.699 kg), SpO2 93 %. Physical Exam  Constitutional: He appears well-developed and well-nourished.  HENT:  Mouth/Throat: Oropharynx is clear and moist.  Eyes: Conjunctivae are normal. No scleral icterus.  Neck: No thyromegaly present.  Cardiovascular: Normal rate, regular rhythm and normal heart sounds.   No murmur heard. Respiratory: Effort normal and breath sounds normal.  GI: Soft. He exhibits no distension and no mass. There is no tenderness.  Musculoskeletal: He exhibits no edema.  Lymphadenopathy:    He has no cervical adenopathy.  Neurological: He is alert.  Skin: Skin is warm and dry.     Assessment/Plan Epigastric pain and nausea of over  6 months duration. Known cholelithiasis. Diagnostic EGD.  Kenyon Eichelberger U 04/11/2014, 12:30 PM

## 2014-04-12 ENCOUNTER — Encounter (HOSPITAL_COMMUNITY): Payer: Self-pay | Admitting: Internal Medicine

## 2014-04-12 LAB — CLOTEST (H. PYLORI), BIOPSY: HELICOBACTER SCREEN: NEGATIVE

## 2014-05-02 ENCOUNTER — Telehealth (INDEPENDENT_AMBULATORY_CARE_PROVIDER_SITE_OTHER): Payer: Self-pay | Admitting: *Deleted

## 2014-05-02 NOTE — Telephone Encounter (Signed)
Shane Osborne said Dr. Laural Golden asked to call and give a progress report. He is doing much better, not having as much nausea as before. Any question, his return phone number is [phone].

## 2014-05-02 NOTE — Telephone Encounter (Signed)
Dr.Rehman was made aware. He is going to review the patient chart , then advise.

## 2014-05-02 NOTE — Telephone Encounter (Signed)
Dr.Rehman to be made aware.

## 2014-05-07 NOTE — Telephone Encounter (Signed)
Patient's call returned Since he is feeling much better will continue to monitor. She and will call with progress report in 4-8 weeks

## 2015-06-10 DIAGNOSIS — R1030 Lower abdominal pain, unspecified: Secondary | ICD-10-CM | POA: Diagnosis not present

## 2015-06-17 DIAGNOSIS — N400 Enlarged prostate without lower urinary tract symptoms: Secondary | ICD-10-CM | POA: Diagnosis not present

## 2015-06-17 DIAGNOSIS — R102 Pelvic and perineal pain: Secondary | ICD-10-CM | POA: Diagnosis not present

## 2015-06-17 DIAGNOSIS — R1030 Lower abdominal pain, unspecified: Secondary | ICD-10-CM | POA: Diagnosis not present

## 2015-07-01 DIAGNOSIS — R1030 Lower abdominal pain, unspecified: Secondary | ICD-10-CM | POA: Diagnosis not present

## 2015-07-01 DIAGNOSIS — L723 Sebaceous cyst: Secondary | ICD-10-CM | POA: Diagnosis not present

## 2015-07-02 DIAGNOSIS — Z79899 Other long term (current) drug therapy: Secondary | ICD-10-CM | POA: Diagnosis not present

## 2015-07-02 DIAGNOSIS — Z7982 Long term (current) use of aspirin: Secondary | ICD-10-CM | POA: Diagnosis not present

## 2015-07-02 DIAGNOSIS — Z8249 Family history of ischemic heart disease and other diseases of the circulatory system: Secondary | ICD-10-CM | POA: Diagnosis not present

## 2015-07-02 DIAGNOSIS — I10 Essential (primary) hypertension: Secondary | ICD-10-CM | POA: Diagnosis not present

## 2015-07-02 DIAGNOSIS — Z808 Family history of malignant neoplasm of other organs or systems: Secondary | ICD-10-CM | POA: Diagnosis not present

## 2015-07-02 DIAGNOSIS — Z883 Allergy status to other anti-infective agents status: Secondary | ICD-10-CM | POA: Diagnosis not present

## 2015-07-02 DIAGNOSIS — Z87442 Personal history of urinary calculi: Secondary | ICD-10-CM | POA: Diagnosis not present

## 2015-07-02 DIAGNOSIS — Z91018 Allergy to other foods: Secondary | ICD-10-CM | POA: Diagnosis not present

## 2015-07-02 DIAGNOSIS — I4891 Unspecified atrial fibrillation: Secondary | ICD-10-CM | POA: Diagnosis not present

## 2015-07-02 DIAGNOSIS — L723 Sebaceous cyst: Secondary | ICD-10-CM | POA: Diagnosis not present

## 2015-07-17 DIAGNOSIS — L02219 Cutaneous abscess of trunk, unspecified: Secondary | ICD-10-CM | POA: Diagnosis not present

## 2015-07-17 DIAGNOSIS — L723 Sebaceous cyst: Secondary | ICD-10-CM | POA: Diagnosis not present

## 2015-09-03 DIAGNOSIS — Z7189 Other specified counseling: Secondary | ICD-10-CM | POA: Diagnosis not present

## 2015-09-03 DIAGNOSIS — Z0000 Encounter for general adult medical examination without abnormal findings: Secondary | ICD-10-CM | POA: Diagnosis not present

## 2015-09-03 DIAGNOSIS — Z6832 Body mass index (BMI) 32.0-32.9, adult: Secondary | ICD-10-CM | POA: Diagnosis not present

## 2015-09-03 DIAGNOSIS — Z79899 Other long term (current) drug therapy: Secondary | ICD-10-CM | POA: Diagnosis not present

## 2015-09-03 DIAGNOSIS — R5383 Other fatigue: Secondary | ICD-10-CM | POA: Diagnosis not present

## 2015-09-03 DIAGNOSIS — Z1389 Encounter for screening for other disorder: Secondary | ICD-10-CM | POA: Diagnosis not present

## 2015-09-03 DIAGNOSIS — Z299 Encounter for prophylactic measures, unspecified: Secondary | ICD-10-CM | POA: Diagnosis not present

## 2015-09-03 DIAGNOSIS — E7800 Pure hypercholesterolemia, unspecified: Secondary | ICD-10-CM | POA: Diagnosis not present

## 2015-09-03 DIAGNOSIS — Z1211 Encounter for screening for malignant neoplasm of colon: Secondary | ICD-10-CM | POA: Diagnosis not present

## 2015-09-03 DIAGNOSIS — Z125 Encounter for screening for malignant neoplasm of prostate: Secondary | ICD-10-CM | POA: Diagnosis not present

## 2015-10-07 DIAGNOSIS — E7800 Pure hypercholesterolemia, unspecified: Secondary | ICD-10-CM | POA: Diagnosis not present

## 2015-10-07 DIAGNOSIS — I10 Essential (primary) hypertension: Secondary | ICD-10-CM | POA: Diagnosis not present

## 2015-10-22 DIAGNOSIS — Z23 Encounter for immunization: Secondary | ICD-10-CM | POA: Diagnosis not present

## 2015-11-07 DIAGNOSIS — I10 Essential (primary) hypertension: Secondary | ICD-10-CM | POA: Diagnosis not present

## 2015-11-07 DIAGNOSIS — E7800 Pure hypercholesterolemia, unspecified: Secondary | ICD-10-CM | POA: Diagnosis not present

## 2015-12-03 DIAGNOSIS — E7800 Pure hypercholesterolemia, unspecified: Secondary | ICD-10-CM | POA: Diagnosis not present

## 2015-12-03 DIAGNOSIS — I10 Essential (primary) hypertension: Secondary | ICD-10-CM | POA: Diagnosis not present

## 2015-12-31 DIAGNOSIS — E7800 Pure hypercholesterolemia, unspecified: Secondary | ICD-10-CM | POA: Diagnosis not present

## 2015-12-31 DIAGNOSIS — I10 Essential (primary) hypertension: Secondary | ICD-10-CM | POA: Diagnosis not present

## 2016-01-31 DIAGNOSIS — I10 Essential (primary) hypertension: Secondary | ICD-10-CM | POA: Diagnosis not present

## 2016-01-31 DIAGNOSIS — E7800 Pure hypercholesterolemia, unspecified: Secondary | ICD-10-CM | POA: Diagnosis not present

## 2016-03-11 ENCOUNTER — Encounter: Payer: Self-pay | Admitting: *Deleted

## 2016-03-12 ENCOUNTER — Encounter: Payer: Self-pay | Admitting: Cardiovascular Disease

## 2016-03-12 ENCOUNTER — Encounter: Payer: Self-pay | Admitting: *Deleted

## 2016-03-12 ENCOUNTER — Ambulatory Visit (INDEPENDENT_AMBULATORY_CARE_PROVIDER_SITE_OTHER): Payer: Medicare Other | Admitting: Cardiovascular Disease

## 2016-03-12 VITALS — BP 122/86 | HR 50 | Ht 69.5 in | Wt 224.2 lb

## 2016-03-12 DIAGNOSIS — I4891 Unspecified atrial fibrillation: Secondary | ICD-10-CM

## 2016-03-12 DIAGNOSIS — R079 Chest pain, unspecified: Secondary | ICD-10-CM | POA: Diagnosis not present

## 2016-03-12 DIAGNOSIS — I10 Essential (primary) hypertension: Secondary | ICD-10-CM | POA: Diagnosis not present

## 2016-03-12 DIAGNOSIS — R001 Bradycardia, unspecified: Secondary | ICD-10-CM

## 2016-03-12 DIAGNOSIS — R5383 Other fatigue: Secondary | ICD-10-CM | POA: Diagnosis not present

## 2016-03-12 DIAGNOSIS — R0602 Shortness of breath: Secondary | ICD-10-CM

## 2016-03-12 DIAGNOSIS — R011 Cardiac murmur, unspecified: Secondary | ICD-10-CM | POA: Diagnosis not present

## 2016-03-12 NOTE — Patient Instructions (Signed)
Medication Instructions:   Stop Metoprolol.   Continue all other medications.    Labwork: none  Testing/Procedures:  Your physician has requested that you have an echocardiogram. Echocardiography is a painless test that uses sound waves to create images of your heart. It provides your doctor with information about the size and shape of your heart and how well your heart's chambers and valves are working. This procedure takes approximately one hour. There are no restrictions for this procedure. Your physician has requested that you have a lexiscan myoview. For further information please visit [email or web address]. Please follow instruction sheet, as given.  Office will contact with results via phone or letter.    Follow-Up: 1 month   Any Other Special Instructions Will Be Listed Below (If Applicable).  If you need a refill on your cardiac medications before your next appointment, please call your pharmacy.

## 2016-03-12 NOTE — Progress Notes (Signed)
CARDIOLOGY CONSULT NOTE  Patient ID: Shane Osborne MRN: [id number] DOB/AGE: Mar 05, 1940 76 y.o.  Admit date: (Not on file) Primary Physician: Glenda Chroman, MD Referring Physician:   Reason for Consultation: fatigue  HPI: The patient is a 76 year old male with a history of hypertension and hyperlipidemia. He was evaluated for chest pain in February 2011 by Dr. Ron Parker. It was deemed noncardiac in etiology. It appears he had some mild plaque disease and calcification of the LAD.  For the past few months he has had progressive exertional dyspnea when walking his dog or climbing stairs. He denies a history of smoking. He's had some chest pain and also felt nauseous but denies vomiting. He denies palpitations. He says he has a hiatal hernia and left rotator cuff problems. Michela Pitcher he was diagnosed with atrial fibrillation at the time of a hernia repair 3 years ago. I do not have a copy of an ECG demonstrating this. He's been sick since December 2017 with fatigue and weakness. He takes 25 mg of metoprolol tartrate every morning.  ECG performed in the office today demonstrates sinus bradycardia, heart rate 55 bpm, with no ischemic ST segment or T-wave abnormalities, nor any arrhythmias.       Allergies  Allergen Reactions  . Sulfa Antibiotics   . Vancomycin     Current Outpatient Prescriptions  Medication Sig Dispense Refill  . aspirin 81 MG tablet Take 81 mg by mouth daily.      . clonazePAM (KLONOPIN) 1 MG tablet Take 1 mg by mouth daily.    Marland Kitchen lisinopril (PRINIVIL,ZESTRIL) 20 MG tablet Take 10 mg by mouth daily.     . metoprolol tartrate (LOPRESSOR) 25 MG tablet Take 25 mg by mouth daily.    . Multiple Vitamin (MULTIVITAMIN) tablet Take 1 tablet by mouth daily.    . Omega-3 Fatty Acids (FISH OIL) 1200 MG CAPS Take by mouth 2 (two) times daily before a meal.    . omeprazole (PRILOSEC) 20 MG capsule Take 20 mg by mouth daily.    . simvastatin (ZOCOR) 20 MG tablet Take 10 mg by  mouth at bedtime.      No current facility-administered medications for this visit.     Past Medical History:  Diagnosis Date  . GERD (gastroesophageal reflux disease)   . Hyperlipidemia   . Hypertension     Past Surgical History:  Procedure Laterality Date  . COLONOSCOPY  10/01/2010   Procedure: COLONOSCOPY;  Surgeon: Rogene Houston, MD;  [facility];  Service: Endoscopy;  Laterality: N/A;  . COLONOSCOPY N/A 09/01/2013   Procedure: COLONOSCOPY;  Surgeon: Rogene Houston, MD;  [facility];  Service: Endoscopy;  Laterality: N/A;  940  . ESOPHAGOGASTRODUODENOSCOPY    . ESOPHAGOGASTRODUODENOSCOPY N/A 04/11/2014   Procedure: ESOPHAGOGASTRODUODENOSCOPY (EGD);  Surgeon: Rogene Houston, MD;  [facility];  Service: Endoscopy;  Laterality: N/A;  1200 - moved to 12:55 - Ann to notify pt  . right leg  20 years ago   X 8 secondary to fracture  . rt inguinal      hernia repair 2 months ago    Social History   Social History  . Marital status: Married    Spouse name: N/A  . Number of children: N/A  . Years of education: N/A   Occupational History  . Not on file.   Social History Main Topics  . Smoking status: Never Smoker  . Smokeless  tobacco: Never Used  . Alcohol use 3.0 oz/week    5 Shots of liquor per week     Comment: 2 oz in the afternoon  . Drug use: No  . Sexual activity: Not on file   Other Topics Concern  . Not on file   Social History Narrative  . No narrative on file     No family history of premature CAD in 1st degree relatives.  Prior to Admission medications   Medication Sig Start Date End Date Taking? Authorizing Provider  aspirin 81 MG tablet Take 81 mg by mouth daily.     Yes Historical Provider, MD  clonazePAM (KLONOPIN) 1 MG tablet Take 1 mg by mouth daily.   Yes Historical Provider, MD  lisinopril (PRINIVIL,ZESTRIL) 20 MG tablet Take 10 mg by mouth daily.    Yes Historical Provider, MD  metoprolol tartrate  (LOPRESSOR) 25 MG tablet Take 25 mg by mouth daily.   Yes Historical Provider, MD  Multiple Vitamin (MULTIVITAMIN) tablet Take 1 tablet by mouth daily.   Yes Historical Provider, MD  Omega-3 Fatty Acids (FISH OIL) 1200 MG CAPS Take by mouth 2 (two) times daily before a meal.   Yes Historical Provider, MD  omeprazole (PRILOSEC) 20 MG capsule Take 20 mg by mouth daily.   Yes Historical Provider, MD  simvastatin (ZOCOR) 20 MG tablet Take 10 mg by mouth at bedtime.    Yes Historical Provider, MD     Review of systems complete and found to be negative unless listed above in HPI     Physical exam Blood pressure 122/86, pulse (!) 50, height 5' 9.5" (1.765 m), weight 224 lb 3.2 oz (101.7 kg), SpO2 98 %. General: NAD Neck: No JVD, no thyromegaly or thyroid nodule.  Lungs: Clear to auscultation bilaterally with normal respiratory effort. CV: Nondisplaced PMI. Regular rate and rhythm, normal S1/S2, no [id number], 2/6 pansystolic murmur over RUSB and along LSB.  No peripheral edema.  No carotid bruit.   Abdomen: Soft, nontender, obese, no distention.  Skin: Intact without lesions or rashes.  Neurologic: Alert and oriented x 3.  Psych: Normal affect. Extremities: No clubbing or cyanosis.  HEENT: Normal.   ECG: Most recent ECG reviewed.  Labs:  No results found for: WBC, HGB, HCT, MCV, PLT No results for input(s): NA, K, CL, CO2, BUN, CREATININE, CALCIUM, PROT, BILITOT, ALKPHOS, ALT, AST, GLUCOSE in the last 168 hours.  Invalid input(s): LABALBU No results found for: CKTOTAL, CKMB, CKMBINDEX, TROPONINI No results found for: CHOL No results found for: HDL No results found for: LDLCALC No results found for: TRIG No results found for: CHOLHDL No results found for: LDLDIRECT       Studies: No results found.  ASSESSMENT AND PLAN:  1. Chest pain, exertional dyspnea, and fatigue: I will proceed with a nuclear myocardial perfusion imaging study to evaluate for ischemic heart disease (Lexiscan). I  will order a 2-D echocardiogram with Doppler to evaluate cardiac structure, function, and regional wall motion.  2. Bradycardia and fatigue: Will stop metoprolol.  3. Murmur: I will order a 2-D echocardiogram with Doppler to evaluate cardiac structure, function, and regional wall motion.  4. Atrial fibrillation: Appears to have occurred 3 years ago at the time of hernia surgery. I will try to obtain an ECG confirming this from PCP. If he indeed has paroxysmal atrial fibrillation he would require anticoagulation given his CHADSVASC score of 3 (age, HTN).  Dispo: fu 1 month    Signed: Kate Sable, M.D.,  F.A.C.C.  03/12/2016, 1:47 PM

## 2016-03-17 ENCOUNTER — Encounter: Payer: Self-pay | Admitting: *Deleted

## 2016-03-18 ENCOUNTER — Inpatient Hospital Stay (HOSPITAL_COMMUNITY): Admission: RE | Admit: 2016-03-18 | Payer: Medicare Other | Source: Ambulatory Visit

## 2016-03-18 ENCOUNTER — Encounter (HOSPITAL_COMMUNITY)
Admission: RE | Admit: 2016-03-18 | Discharge: 2016-03-18 | Disposition: A | Discharge status: Home / Self Care | Payer: Medicare Other | Source: Ambulatory Visit | Attending: Cardiovascular Disease | Admitting: Cardiovascular Disease

## 2016-03-18 ENCOUNTER — Ambulatory Visit (HOSPITAL_COMMUNITY)
Admission: RE | Admit: 2016-03-18 | Discharge: 2016-03-18 | Disposition: A | Discharge status: Home / Self Care | Payer: Medicare Other | Source: Ambulatory Visit | Attending: Cardiovascular Disease | Admitting: Cardiovascular Disease

## 2016-03-18 ENCOUNTER — Encounter (HOSPITAL_COMMUNITY): Payer: Self-pay

## 2016-03-18 DIAGNOSIS — R0602 Shortness of breath: Secondary | ICD-10-CM

## 2016-03-18 DIAGNOSIS — R011 Cardiac murmur, unspecified: Secondary | ICD-10-CM | POA: Diagnosis not present

## 2016-03-18 DIAGNOSIS — I071 Rheumatic tricuspid insufficiency: Secondary | ICD-10-CM | POA: Diagnosis not present

## 2016-03-18 DIAGNOSIS — I4891 Unspecified atrial fibrillation: Secondary | ICD-10-CM | POA: Insufficient documentation

## 2016-03-18 DIAGNOSIS — E785 Hyperlipidemia, unspecified: Secondary | ICD-10-CM | POA: Diagnosis not present

## 2016-03-18 DIAGNOSIS — R079 Chest pain, unspecified: Secondary | ICD-10-CM | POA: Insufficient documentation

## 2016-03-18 DIAGNOSIS — I10 Essential (primary) hypertension: Secondary | ICD-10-CM | POA: Diagnosis not present

## 2016-03-18 DIAGNOSIS — I209 Angina pectoris, unspecified: Secondary | ICD-10-CM | POA: Insufficient documentation

## 2016-03-18 DIAGNOSIS — I350 Nonrheumatic aortic (valve) stenosis: Secondary | ICD-10-CM | POA: Insufficient documentation

## 2016-03-18 DIAGNOSIS — R931 Abnormal findings on diagnostic imaging of heart and coronary circulation: Secondary | ICD-10-CM | POA: Diagnosis not present

## 2016-03-18 DIAGNOSIS — R001 Bradycardia, unspecified: Secondary | ICD-10-CM | POA: Diagnosis not present

## 2016-03-18 LAB — NM MYOCAR MULTI W/SPECT W/WALL MOTION / EF
LV sys vol: 35 mL
LVDIAVOL: 80 mL (ref 62–150)
NUC STRESS TID: 0.98
Peak HR: 120 {beats}/min
RATE: 0.31
Rest HR: 70 {beats}/min
SDS: 6
SRS: 4
SSS: 10

## 2016-03-18 MED ORDER — TECHNETIUM TC 99M TETROFOSMIN IV KIT
10.0000 | PACK | Freq: Once | INTRAVENOUS | Status: AC | PRN
  Administered 2016-03-18: 10.7 via INTRAVENOUS

## 2016-03-18 MED ORDER — SODIUM CHLORIDE 0.9% FLUSH
INTRAVENOUS | Status: AC
  Filled 2016-03-18: qty 10

## 2016-03-18 MED ORDER — REGADENOSON 0.4 MG/5ML IV SOLN
INTRAVENOUS | Status: AC
  Filled 2016-03-18: qty 5

## 2016-03-18 MED ORDER — REGADENOSON 0.4 MG/5ML IV SOLN
INTRAVENOUS | Status: AC
  Administered 2016-03-18: 0.4 mg via INTRAVENOUS
  Filled 2016-03-18: qty 5

## 2016-03-18 MED ORDER — TECHNETIUM TC 99M TETROFOSMIN IV KIT
30.0000 | PACK | Freq: Once | INTRAVENOUS | Status: AC | PRN
  Administered 2016-03-18: 31.3 via INTRAVENOUS

## 2016-03-18 MED ORDER — SODIUM CHLORIDE 0.9% FLUSH
INTRAVENOUS | Status: AC
  Administered 2016-03-18: 10 mL via INTRAVENOUS
  Filled 2016-03-18: qty 10

## 2016-03-18 NOTE — Progress Notes (Signed)
*  PRELIMINARY RESULTS* Echocardiogram 2D Echocardiogram has been performed.  [name] 03/18/2016, 11:56 AM

## 2016-03-19 ENCOUNTER — Telehealth: Payer: Self-pay | Admitting: *Deleted

## 2016-03-19 NOTE — Telephone Encounter (Signed)
STRESS TEST -   Notes Recorded by Laurine Blazer, LPN on QA348G at QA348G AM EST Patient notified and verbalized understanding. Copy to pmd. OV scheduled for tomorrow at 4:20 with Dr. Bronson Ing in the [place] office. ------  Notes Recorded by Herminio Commons, MD on 03/19/2016 at 6:59 AM EST Please schedule appt with me for 3/5 at 8:40 or tomorrow at 4:20 to discuss results and next steps. Stress test is abnormal, suggestive of old heart attack and possible blockage.

## 2016-03-19 NOTE — Telephone Encounter (Signed)
ECHO -   Notes Recorded by Laurine Blazer, LPN on QA348G at QA348G AM EST Patient notified and verbalized understanding. Copy to pmd. ------  Notes Recorded by Herminio Commons, MD on 03/19/2016 at 7:00 AM EST Normal pumping function. Mild to moderate narrowing of aortic valve due to calcium buildup. Will repeat echocardiogram in 1.5-2

## 2016-03-20 ENCOUNTER — Ambulatory Visit (INDEPENDENT_AMBULATORY_CARE_PROVIDER_SITE_OTHER): Payer: Medicare Other | Admitting: Cardiovascular Disease

## 2016-03-20 ENCOUNTER — Other Ambulatory Visit (HOSPITAL_COMMUNITY)
Admission: RE | Admit: 2016-03-20 | Discharge: 2016-03-20 | Disposition: A | Discharge status: Home / Self Care | Payer: Medicare Other | Source: Ambulatory Visit | Attending: Cardiovascular Disease | Admitting: Cardiovascular Disease

## 2016-03-20 ENCOUNTER — Ambulatory Visit (HOSPITAL_COMMUNITY)
Admission: RE | Admit: 2016-03-20 | Discharge: 2016-03-20 | Disposition: A | Discharge status: Home / Self Care | Payer: Medicare Other | Source: Ambulatory Visit | Attending: Cardiovascular Disease | Admitting: Cardiovascular Disease

## 2016-03-20 ENCOUNTER — Other Ambulatory Visit: Payer: Self-pay | Admitting: Cardiovascular Disease

## 2016-03-20 ENCOUNTER — Encounter: Payer: Self-pay | Admitting: Cardiovascular Disease

## 2016-03-20 VITALS — BP 162/79 | HR 83 | Ht 69.0 in | Wt 221.0 lb

## 2016-03-20 DIAGNOSIS — R0602 Shortness of breath: Secondary | ICD-10-CM

## 2016-03-20 DIAGNOSIS — I209 Angina pectoris, unspecified: Secondary | ICD-10-CM | POA: Insufficient documentation

## 2016-03-20 DIAGNOSIS — R9439 Abnormal result of other cardiovascular function study: Secondary | ICD-10-CM

## 2016-03-20 DIAGNOSIS — Z01818 Encounter for other preprocedural examination: Secondary | ICD-10-CM

## 2016-03-20 DIAGNOSIS — I350 Nonrheumatic aortic (valve) stenosis: Secondary | ICD-10-CM | POA: Diagnosis not present

## 2016-03-20 DIAGNOSIS — I4891 Unspecified atrial fibrillation: Secondary | ICD-10-CM

## 2016-03-20 DIAGNOSIS — R079 Chest pain, unspecified: Secondary | ICD-10-CM | POA: Diagnosis not present

## 2016-03-20 DIAGNOSIS — R5383 Other fatigue: Secondary | ICD-10-CM

## 2016-03-20 DIAGNOSIS — I259 Chronic ischemic heart disease, unspecified: Secondary | ICD-10-CM

## 2016-03-20 DIAGNOSIS — R0609 Other forms of dyspnea: Secondary | ICD-10-CM | POA: Insufficient documentation

## 2016-03-20 DIAGNOSIS — R0789 Other chest pain: Secondary | ICD-10-CM | POA: Diagnosis not present

## 2016-03-20 DIAGNOSIS — I10 Essential (primary) hypertension: Secondary | ICD-10-CM | POA: Diagnosis not present

## 2016-03-20 LAB — CBC WITH DIFFERENTIAL/PLATELET
BASOS ABS: 0.1 10*3/uL (ref 0.0–0.1)
Basophils Relative: 1 %
EOS PCT: 7 %
Eosinophils Absolute: 0.5 10*3/uL (ref 0.0–0.7)
HCT: 43.1 % (ref 39.0–52.0)
Hemoglobin: 14.9 g/dL (ref 13.0–17.0)
Lymphocytes Relative: 30 %
Lymphs Abs: 2 10*3/uL (ref 0.7–4.0)
MCH: 32 pg (ref 26.0–34.0)
MCHC: 34.6 g/dL (ref 30.0–36.0)
MCV: 92.7 fL (ref 78.0–100.0)
MONO ABS: 0.7 10*3/uL (ref 0.1–1.0)
Monocytes Relative: 10 %
Neutro Abs: 3.5 10*3/uL (ref 1.7–7.7)
Neutrophils Relative %: 52 %
PLATELETS: 259 10*3/uL (ref 150–400)
RBC: 4.65 MIL/uL (ref 4.22–5.81)
RDW: 13 % (ref 11.5–15.5)
WBC: 6.7 10*3/uL (ref 4.0–10.5)

## 2016-03-20 LAB — BASIC METABOLIC PANEL
ANION GAP: 9 (ref 5–15)
BUN: 14 mg/dL (ref 6–20)
CO2: 26 mmol/L (ref 22–32)
Calcium: 9 mg/dL (ref 8.9–10.3)
Chloride: 102 mmol/L (ref 101–111)
Creatinine, Ser: 0.9 mg/dL (ref 0.61–1.24)
GFR calc Af Amer: >60 mL/min (ref >60)
GLUCOSE: 91 mg/dL (ref 65–99)
POTASSIUM: 3.2 mmol/L — AB (ref 3.5–5.1)
SODIUM: 137 mmol/L (ref 135–145)

## 2016-03-20 LAB — PROTIME-INR
INR: 0.94
PROTHROMBIN TIME: 12.6 s (ref 11.4–15.2)

## 2016-03-20 MED ORDER — METOPROLOL TARTRATE 25 MG PO TABS: 12.5000 mg | ORAL_TABLET | Freq: Two times a day (BID) | ORAL | 3 refills | Status: DC

## 2016-03-20 MED ORDER — NITROGLYCERIN 0.4 MG SL SUBL: 0.4000 mg | SUBLINGUAL_TABLET | SUBLINGUAL | 3 refills | Status: DC | PRN

## 2016-03-20 MED ORDER — LISINOPRIL 20 MG PO TABS: 20.0000 mg | ORAL_TABLET | Freq: Every day | ORAL | 3 refills | Status: DC

## 2016-03-20 NOTE — Progress Notes (Signed)
SUBJECTIVE: The patient returns for follow-up after undergoing cardiovascular testing performed for the evaluation of exertional dyspnea, chest pain, and fatigue.  Nuclear stress test 03/18/16:  Findings consistent with prior large inferior myocardial infarction with moderate peri-infarct ischemia.  This is an intermediate risk study.  I stopped metoprolol at his last visit due to bradycardia and fatigue (had been taking metoprolol tartrate 25 mg q am) but this made no difference to his symptoms.  He continues to have occasional upper left sided chest pains.  Has not had recent blood tests.     Review of Systems: As per "subjective", otherwise negative.  Allergies  Allergen Reactions  . Sulfa Antibiotics   . Vancomycin     Current Outpatient Prescriptions  Medication Sig Dispense Refill  . aspirin 81 MG tablet Take 81 mg by mouth daily.      Marland Kitchen lisinopril (PRINIVIL,ZESTRIL) 20 MG tablet Take 10 mg by mouth daily.     . Multiple Vitamin (MULTIVITAMIN) tablet Take 1 tablet by mouth daily.    . Omega-3 Fatty Acids (FISH OIL) 1200 MG CAPS Take by mouth 2 (two) times daily before a meal.    . simvastatin (ZOCOR) 20 MG tablet Take 10 mg by mouth at bedtime.      No current facility-administered medications for this visit.     Past Medical History:  Diagnosis Date  . GERD (gastroesophageal reflux disease)   . Hyperlipidemia   . Hypertension     Past Surgical History:  Procedure Laterality Date  . COLONOSCOPY  10/01/2010   Procedure: COLONOSCOPY;  Surgeon: Rogene Houston, MD;  [facility];  Service: Endoscopy;  Laterality: N/A;  . COLONOSCOPY N/A 09/01/2013   Procedure: COLONOSCOPY;  Surgeon: Rogene Houston, MD;  [facility];  Service: Endoscopy;  Laterality: N/A;  940  . ESOPHAGOGASTRODUODENOSCOPY    . ESOPHAGOGASTRODUODENOSCOPY N/A 04/11/2014   Procedure: ESOPHAGOGASTRODUODENOSCOPY (EGD);  Surgeon: Rogene Houston, MD;  [facility];   Service: Endoscopy;  Laterality: N/A;  1200 - moved to 12:55 - Ann to notify pt  . right leg  20 years ago   X 8 secondary to fracture  . rt inguinal      hernia repair 2 months ago    Social History   Social History  . Marital status: Married    Spouse name: N/A  . Number of children: N/A  . Years of education: N/A   Occupational History  . Not on file.   Social History Main Topics  . Smoking status: Never Smoker  . Smokeless tobacco: Never Used  . Alcohol use 3.0 oz/week    5 Shots of liquor per week     Comment: 2 oz in the afternoon  . Drug use: No  . Sexual activity: Not on file   Other Topics Concern  . Not on file   Social History Narrative  . No narrative on file     Vitals:   03/20/16 1436  BP: (!) 162/79  Pulse: 83  SpO2: 96%  Weight: 221 lb (100.2 kg)  Height: 5\' 9"  (1.753 m)    PHYSICAL EXAM General: NAD Neck: No JVD, no thyromegaly or thyroid nodule.  Lungs: Clear to auscultation bilaterally with normal respiratory effort. CV: Nondisplaced PMI. Regular rate and rhythm, normal S1/S2, no XX123456, 2/6 pansystolic murmur over RUSB and along LSB.  No peripheral edema.  No carotid bruit.   Abdomen: Soft, nontender, obese, no distention.  Skin: Intact without lesions or rashes.  Neurologic: Alert and oriented x 3.  Psych: Normal affect. Extremities: No clubbing or cyanosis.  HEENT: Normal.      ECG: Most recent ECG reviewed.      ASSESSMENT AND PLAN: 1. Chest pain, exertional dyspnea, and fatigue with abnormal stress test detailed above: I will arrange for coronary angiography. Will need to obtain BMET and CBC. Will restart metoprolol 12.5 mg bid. Will also prescribe SL nitroglycerin.  2. Bradycardia and fatigue: Metoprolol stopped at last visit. HR normal. Given probable CAD, will reinstitute at 12.5 mg bid.  3. Aortic stenosis: Mild to moderate. Will repeat in 1.5-2 years.  4. Atrial fibrillation: Appears to have occurred 3 years ago  at the time of hernia surgery. I will try to obtain an ECG confirming this from PCP. If he indeed has paroxysmal atrial fibrillation he would require anticoagulation given his CHADSVASC score of 3 (age, HTN).  5. Hypertension: Elevated. Will increase lisinopril to 20 mg.  Dispo: fu 1 month  Time spent: 40 minutes, of which greater than 50% was spent reviewing symptoms, relevant blood tests and studies, and discussing management plan with the patient.   Kate Sable, M.D., F.A.C.C.

## 2016-03-20 NOTE — Patient Instructions (Signed)
Your physician recommends that you schedule a follow-up appointment in: 1 month    Your physician has requested that you have a cardiac catheterization. Cardiac catheterization is used to diagnose and/or treat various heart conditions. Doctors may recommend this procedure for a number of different reasons. The most common reason is to evaluate chest pain. Chest pain can be a symptom of coronary artery disease (CAD), and cardiac catheterization can show whether plaque is narrowing or blocking your heart's arteries. This procedure is also used to evaluate the valves, as well as measure the blood flow and oxygen levels in different parts of your heart. For further information please visit [email or web address]. Please follow instruction sheet, as given.    INCREASE Lisinopril to 20 mg daily   TAKE Metoprolol 12.5 mg twice a day   Take nitroglycerine as directed    Get lab work and chest x-ray today     Thank you for choosing [facility] !

## 2016-03-23 ENCOUNTER — Ambulatory Visit (HOSPITAL_COMMUNITY)
Admission: RE | Admit: 2016-03-23 | Discharge: 2016-03-23 | Disposition: A | Discharge status: Home / Self Care | Payer: Medicare Other | Source: Ambulatory Visit | Attending: Cardiovascular Disease | Admitting: Cardiovascular Disease

## 2016-03-23 ENCOUNTER — Encounter (HOSPITAL_COMMUNITY): Payer: Self-pay | Admitting: Cardiovascular Disease

## 2016-03-23 ENCOUNTER — Encounter (HOSPITAL_COMMUNITY)
Admission: RE | Disposition: A | Discharge status: Home / Self Care | Payer: Self-pay | Source: Ambulatory Visit | Attending: Cardiovascular Disease

## 2016-03-23 ENCOUNTER — Telehealth: Payer: Self-pay

## 2016-03-23 DIAGNOSIS — I4891 Unspecified atrial fibrillation: Secondary | ICD-10-CM | POA: Diagnosis not present

## 2016-03-23 DIAGNOSIS — I350 Nonrheumatic aortic (valve) stenosis: Secondary | ICD-10-CM | POA: Insufficient documentation

## 2016-03-23 DIAGNOSIS — E785 Hyperlipidemia, unspecified: Secondary | ICD-10-CM | POA: Insufficient documentation

## 2016-03-23 DIAGNOSIS — K219 Gastro-esophageal reflux disease without esophagitis: Secondary | ICD-10-CM | POA: Insufficient documentation

## 2016-03-23 DIAGNOSIS — R9439 Abnormal result of other cardiovascular function study: Secondary | ICD-10-CM | POA: Diagnosis not present

## 2016-03-23 DIAGNOSIS — Z882 Allergy status to sulfonamides status: Secondary | ICD-10-CM | POA: Insufficient documentation

## 2016-03-23 DIAGNOSIS — R0609 Other forms of dyspnea: Secondary | ICD-10-CM | POA: Diagnosis not present

## 2016-03-23 DIAGNOSIS — I10 Essential (primary) hypertension: Secondary | ICD-10-CM | POA: Diagnosis not present

## 2016-03-23 DIAGNOSIS — I2510 Atherosclerotic heart disease of native coronary artery without angina pectoris: Secondary | ICD-10-CM | POA: Insufficient documentation

## 2016-03-23 DIAGNOSIS — I2584 Coronary atherosclerosis due to calcified coronary lesion: Secondary | ICD-10-CM | POA: Diagnosis not present

## 2016-03-23 DIAGNOSIS — R931 Abnormal findings on diagnostic imaging of heart and coronary circulation: Secondary | ICD-10-CM | POA: Diagnosis present

## 2016-03-23 DIAGNOSIS — Z7982 Long term (current) use of aspirin: Secondary | ICD-10-CM | POA: Insufficient documentation

## 2016-03-23 DIAGNOSIS — I209 Angina pectoris, unspecified: Secondary | ICD-10-CM

## 2016-03-23 DIAGNOSIS — R0602 Shortness of breath: Secondary | ICD-10-CM

## 2016-03-23 DIAGNOSIS — R9431 Abnormal electrocardiogram [ECG] [EKG]: Secondary | ICD-10-CM | POA: Diagnosis present

## 2016-03-23 HISTORY — PX: LEFT HEART CATH AND CORONARY ANGIOGRAPHY: CATH118249

## 2016-03-23 LAB — POTASSIUM: Potassium: 3.9 mmol/L (ref 3.5–5.1)

## 2016-03-23 SURGERY — LEFT HEART CATH AND CORONARY ANGIOGRAPHY
Anesthesia: LOCAL

## 2016-03-23 MED ORDER — SODIUM CHLORIDE 0.9% FLUSH
3.0000 mL | INTRAVENOUS | Status: DC | PRN
Start: 2016-03-23 — End: 2016-03-23

## 2016-03-23 MED ORDER — HEPARIN (PORCINE) IN NACL 2-0.9 UNIT/ML-% IJ SOLN
INTRAMUSCULAR | Status: AC
  Filled 2016-03-23: qty 1500

## 2016-03-23 MED ORDER — MIDAZOLAM HCL 2 MG/2ML IJ SOLN
INTRAMUSCULAR | Status: DC | PRN
  Administered 2016-03-23: 2 mg via INTRAVENOUS

## 2016-03-23 MED ORDER — SODIUM CHLORIDE 0.9% FLUSH: 3.0000 mL | Freq: Two times a day (BID) | INTRAVENOUS | Status: DC

## 2016-03-23 MED ORDER — SODIUM CHLORIDE 0.9 % IV SOLN: 250.0000 mL | INTRAVENOUS | Status: DC | PRN

## 2016-03-23 MED ORDER — LIDOCAINE HCL (PF) 1 % IJ SOLN
INTRAMUSCULAR | Status: DC | PRN
  Administered 2016-03-23: 2 mL

## 2016-03-23 MED ORDER — MIDAZOLAM HCL 2 MG/2ML IJ SOLN
INTRAMUSCULAR | Status: AC
  Filled 2016-03-23: qty 2

## 2016-03-23 MED ORDER — VERAPAMIL HCL 2.5 MG/ML IV SOLN
INTRAVENOUS | Status: DC | PRN
  Administered 2016-03-23: 10 mL via INTRA_ARTERIAL

## 2016-03-23 MED ORDER — SODIUM CHLORIDE 0.9 % WEIGHT BASED INFUSION: 1.0000 mL/kg/h | INTRAVENOUS | Status: DC

## 2016-03-23 MED ORDER — FENTANYL CITRATE (PF) 100 MCG/2ML IJ SOLN
INTRAMUSCULAR | Status: DC | PRN
  Administered 2016-03-23: 25 ug via INTRAVENOUS

## 2016-03-23 MED ORDER — ACETAMINOPHEN 325 MG PO TABS: 650.0000 mg | ORAL_TABLET | ORAL | Status: DC | PRN

## 2016-03-23 MED ORDER — HEPARIN (PORCINE) IN NACL 2-0.9 UNIT/ML-% IJ SOLN
INTRAMUSCULAR | Status: DC | PRN
  Administered 2016-03-23: 1500 mL

## 2016-03-23 MED ORDER — LIDOCAINE HCL (PF) 1 % IJ SOLN
INTRAMUSCULAR | Status: AC
  Filled 2016-03-23: qty 30

## 2016-03-23 MED ORDER — ASPIRIN 81 MG PO CHEW
81.0000 mg | CHEWABLE_TABLET | ORAL | Status: AC
  Administered 2016-03-23: 81 mg via ORAL

## 2016-03-23 MED ORDER — VERAPAMIL HCL 2.5 MG/ML IV SOLN
INTRAVENOUS | Status: AC
  Filled 2016-03-23: qty 2

## 2016-03-23 MED ORDER — SODIUM CHLORIDE 0.9 % WEIGHT BASED INFUSION
3.0000 mL/kg/h | INTRAVENOUS | Status: DC
  Administered 2016-03-23: 3 mL/kg/h via INTRAVENOUS

## 2016-03-23 MED ORDER — IOPAMIDOL (ISOVUE-370) INJECTION 76%
INTRAVENOUS | Status: AC
  Filled 2016-03-23: qty 100

## 2016-03-23 MED ORDER — HEPARIN SODIUM (PORCINE) 1000 UNIT/ML IJ SOLN
INTRAMUSCULAR | Status: DC | PRN
  Administered 2016-03-23: 5000 [IU] via INTRAVENOUS

## 2016-03-23 MED ORDER — HEPARIN SODIUM (PORCINE) 1000 UNIT/ML IJ SOLN
INTRAMUSCULAR | Status: AC
  Filled 2016-03-23: qty 1

## 2016-03-23 MED ORDER — IOPAMIDOL (ISOVUE-370) INJECTION 76%
INTRAVENOUS | Status: DC | PRN
  Administered 2016-03-23: 50 mL via INTRA_ARTERIAL

## 2016-03-23 MED ORDER — FENTANYL CITRATE (PF) 100 MCG/2ML IJ SOLN
INTRAMUSCULAR | Status: AC
  Filled 2016-03-23: qty 2

## 2016-03-23 MED ORDER — SODIUM CHLORIDE 0.9% FLUSH: 3.0000 mL | INTRAVENOUS | Status: DC | PRN

## 2016-03-23 MED ORDER — ASPIRIN 81 MG PO CHEW
CHEWABLE_TABLET | ORAL | Status: AC
  Filled 2016-03-23: qty 1

## 2016-03-23 MED ORDER — ONDANSETRON HCL 4 MG/2ML IJ SOLN: 4.0000 mg | Freq: Four times a day (QID) | INTRAMUSCULAR | Status: DC | PRN

## 2016-03-23 SURGICAL SUPPLY — 8 items
DEVICE RAD COMP TR BAND LRG (VASCULAR PRODUCTS) ×2 IMPLANT
GLIDESHEATH SLEND SS 6F .021 (SHEATH) ×2 IMPLANT
GUIDEWIRE INQWIRE 1.5J.035X260 (WIRE) ×1 IMPLANT
INQWIRE 1.5J .035X260CM (WIRE) ×2
KIT HEART LEFT (KITS) ×2 IMPLANT
PACK CARDIAC CATHETERIZATION (CUSTOM PROCEDURE TRAY) ×2 IMPLANT
TRANSDUCER W/STOPCOCK (MISCELLANEOUS) ×2 IMPLANT
TUBING CIL FLEX 10 FLL-RA (TUBING) ×2 IMPLANT

## 2016-03-23 NOTE — Discharge Instructions (Signed)

## 2016-03-23 NOTE — Interval H&P Note (Signed)
Cath Lab Visit (complete for each Cath Lab visit)  Clinical Evaluation Leading to the Procedure:   ACS: No.  Non-ACS:    Anginal Classification: CCS II  Anti-ischemic medical therapy: Minimal Therapy (1 class of medications)  Non-Invasive Test Results: Intermediate-risk stress test findings: cardiac mortality 1-3%/year  Prior CABG: No previous CABG      History and Physical Interval Note:  03/23/2016 8:50 AM  [facility]  has presented today for surgery, with the diagnosis of cp  The various methods of treatment have been discussed with the patient and family. After consideration of risks, benefits and other options for treatment, the patient has consented to  Procedure(s): Left Heart Cath and Coronary Angiography (N/A) as a surgical intervention .  The patient's history has been reviewed, patient examined, no change in status, stable for surgery.  I have reviewed the patient's chart and labs.  Questions were answered to the patient's satisfaction.     Sherren Mocha

## 2016-03-23 NOTE — Telephone Encounter (Signed)
-----   Message from Herminio Commons, MD sent at 03/20/2016  5:05 PM EST ----- Potassium is low. Start 20 meq KCl daily. Repeat BMET in one week.

## 2016-03-23 NOTE — H&P (View-Only) (Signed)
SUBJECTIVE: The patient returns for follow-up after undergoing cardiovascular testing performed for the evaluation of exertional dyspnea, chest pain, and fatigue.  Nuclear stress test 03/18/16:  Findings consistent with prior large inferior myocardial infarction with moderate peri-infarct ischemia.  This is an intermediate risk study.  I stopped metoprolol at his last visit due to bradycardia and fatigue (had been taking metoprolol tartrate 25 mg q am) but this made no difference to his symptoms.  He continues to have occasional upper left sided chest pains.  Has not had recent blood tests.     Review of Systems: As per "subjective", otherwise negative.  Allergies  Allergen Reactions  . Sulfa Antibiotics   . Vancomycin     Current Outpatient Prescriptions  Medication Sig Dispense Refill  . aspirin 81 MG tablet Take 81 mg by mouth daily.      Marland Kitchen lisinopril (PRINIVIL,ZESTRIL) 20 MG tablet Take 10 mg by mouth daily.     . Multiple Vitamin (MULTIVITAMIN) tablet Take 1 tablet by mouth daily.    . Omega-3 Fatty Acids (FISH OIL) 1200 MG CAPS Take by mouth 2 (two) times daily before a meal.    . simvastatin (ZOCOR) 20 MG tablet Take 10 mg by mouth at bedtime.      No current facility-administered medications for this visit.     Past Medical History:  Diagnosis Date  . GERD (gastroesophageal reflux disease)   . Hyperlipidemia   . Hypertension     Past Surgical History:  Procedure Laterality Date  . COLONOSCOPY  10/01/2010   Procedure: COLONOSCOPY;  Surgeon: Rogene Houston, MD;  [facility];  Service: Endoscopy;  Laterality: N/A;  . COLONOSCOPY N/A 09/01/2013   Procedure: COLONOSCOPY;  Surgeon: Rogene Houston, MD;  [facility];  Service: Endoscopy;  Laterality: N/A;  940  . ESOPHAGOGASTRODUODENOSCOPY    . ESOPHAGOGASTRODUODENOSCOPY N/A 04/11/2014   Procedure: ESOPHAGOGASTRODUODENOSCOPY (EGD);  Surgeon: Rogene Houston, MD;  [facility];   Service: Endoscopy;  Laterality: N/A;  1200 - moved to 12:55 - Ann to notify pt  . right leg  20 years ago   X 8 secondary to fracture  . rt inguinal      hernia repair 2 months ago    Social History   Social History  . Marital status: Married    Spouse name: N/A  . Number of children: N/A  . Years of education: N/A   Occupational History  . Not on file.   Social History Main Topics  . Smoking status: Never Smoker  . Smokeless tobacco: Never Used  . Alcohol use 3.0 oz/week    5 Shots of liquor per week     Comment: 2 oz in the afternoon  . Drug use: No  . Sexual activity: Not on file   Other Topics Concern  . Not on file   Social History Narrative  . No narrative on file     Vitals:   03/20/16 1436  BP: (!) 162/79  Pulse: 83  SpO2: 96%  Weight: 221 lb (100.2 kg)  Height: 5\' 9"  (1.753 m)    PHYSICAL EXAM General: NAD Neck: No JVD, no thyromegaly or thyroid nodule.  Lungs: Clear to auscultation bilaterally with normal respiratory effort. CV: Nondisplaced PMI. Regular rate and rhythm, normal S1/S2, no XX123456, 2/6 pansystolic murmur over RUSB and along LSB.  No peripheral edema.  No carotid bruit.   Abdomen: Soft, nontender, obese, no distention.  Skin: Intact without lesions or rashes.  Neurologic: Alert and oriented x 3.  Psych: Normal affect. Extremities: No clubbing or cyanosis.  HEENT: Normal.      ECG: Most recent ECG reviewed.      ASSESSMENT AND PLAN: 1. Chest pain, exertional dyspnea, and fatigue with abnormal stress test detailed above: I will arrange for coronary angiography. Will need to obtain BMET and CBC. Will restart metoprolol 12.5 mg bid. Will also prescribe SL nitroglycerin.  2. Bradycardia and fatigue: Metoprolol stopped at last visit. HR normal. Given probable CAD, will reinstitute at 12.5 mg bid.  3. Aortic stenosis: Mild to moderate. Will repeat in 1.5-2 years.  4. Atrial fibrillation: Appears to have occurred 3 years ago  at the time of hernia surgery. I will try to obtain an ECG confirming this from PCP. If he indeed has paroxysmal atrial fibrillation he would require anticoagulation given his CHADSVASC score of 3 (age, HTN).  5. Hypertension: Elevated. Will increase lisinopril to 20 mg.  Dispo: fu 1 month  Time spent: 40 minutes, of which greater than 50% was spent reviewing symptoms, relevant blood tests and studies, and discussing management plan with the patient.   Kate Sable, M.D., F.A.C.C.

## 2016-03-23 NOTE — Telephone Encounter (Signed)
Lmtcb,pt uses mail order pharmacy, I need to know which local pharmacy he wants potassium called into.

## 2016-03-24 ENCOUNTER — Telehealth: Payer: Self-pay

## 2016-03-24 DIAGNOSIS — E876 Hypokalemia: Secondary | ICD-10-CM

## 2016-03-24 MED ORDER — POTASSIUM CHLORIDE CRYS ER 20 MEQ PO TBCR: 20.0000 meq | EXTENDED_RELEASE_TABLET | Freq: Every day | ORAL | 0 refills | Status: DC

## 2016-03-24 MED ORDER — POTASSIUM CHLORIDE CRYS ER 20 MEQ PO TBCR: 20.0000 meq | EXTENDED_RELEASE_TABLET | Freq: Every day | ORAL | 3 refills | Status: DC

## 2016-03-24 NOTE — Telephone Encounter (Signed)
Spoke with pt this am,10 day supply of K+ sent to local pharmacy while awaiting mail order,mailed lab slip for repeat labs in 1 wwek

## 2016-03-24 NOTE — Telephone Encounter (Signed)
-----   Message from Herminio Commons, MD sent at 03/20/2016  5:05 PM EST ----- Potassium is low. Start 20 meq KCl daily. Repeat BMET in one week.

## 2016-03-25 DIAGNOSIS — I10 Essential (primary) hypertension: Secondary | ICD-10-CM | POA: Diagnosis not present

## 2016-03-25 DIAGNOSIS — E7800 Pure hypercholesterolemia, unspecified: Secondary | ICD-10-CM | POA: Diagnosis not present

## 2016-04-01 ENCOUNTER — Other Ambulatory Visit (HOSPITAL_COMMUNITY)
Admission: RE | Admit: 2016-04-01 | Discharge: 2016-04-01 | Disposition: A | Discharge status: Home / Self Care | Payer: Medicare Other | Source: Ambulatory Visit | Attending: Cardiovascular Disease | Admitting: Cardiovascular Disease

## 2016-04-01 DIAGNOSIS — E876 Hypokalemia: Secondary | ICD-10-CM | POA: Insufficient documentation

## 2016-04-01 LAB — BASIC METABOLIC PANEL WITH GFR
Anion gap: 7 (ref 5–15)
BUN: 14 mg/dL (ref 6–20)
CO2: 26 mmol/L (ref 22–32)
Calcium: 9.2 mg/dL (ref 8.9–10.3)
Chloride: 102 mmol/L (ref 101–111)
Creatinine, Ser: 1.03 mg/dL (ref 0.61–1.24)
GFR calc Af Amer: >60 mL/min
GFR calc non Af Amer: >60 mL/min
Glucose, Bld: 108 mg/dL — ABNORMAL HIGH (ref 65–99)
Potassium: 4.6 mmol/L (ref 3.5–5.1)
Sodium: 135 mmol/L (ref 135–145)

## 2016-04-03 ENCOUNTER — Ambulatory Visit: Payer: Medicare Other | Admitting: Cardiovascular Disease

## 2016-04-15 ENCOUNTER — Encounter: Payer: Self-pay | Admitting: Adult Health

## 2016-04-21 ENCOUNTER — Ambulatory Visit (INDEPENDENT_AMBULATORY_CARE_PROVIDER_SITE_OTHER): Payer: Medicare Other | Admitting: Cardiovascular Disease

## 2016-04-21 ENCOUNTER — Encounter: Payer: Self-pay | Admitting: Cardiovascular Disease

## 2016-04-21 VITALS — BP 124/82 | HR 64 | Ht 69.0 in | Wt 223.0 lb

## 2016-04-21 DIAGNOSIS — I350 Nonrheumatic aortic (valve) stenosis: Secondary | ICD-10-CM | POA: Diagnosis not present

## 2016-04-21 DIAGNOSIS — I209 Angina pectoris, unspecified: Secondary | ICD-10-CM | POA: Diagnosis not present

## 2016-04-21 DIAGNOSIS — I10 Essential (primary) hypertension: Secondary | ICD-10-CM

## 2016-04-21 DIAGNOSIS — I4891 Unspecified atrial fibrillation: Secondary | ICD-10-CM | POA: Diagnosis not present

## 2016-04-21 DIAGNOSIS — R0602 Shortness of breath: Secondary | ICD-10-CM | POA: Diagnosis not present

## 2016-04-21 DIAGNOSIS — R079 Chest pain, unspecified: Secondary | ICD-10-CM | POA: Diagnosis not present

## 2016-04-21 DIAGNOSIS — E876 Hypokalemia: Secondary | ICD-10-CM

## 2016-04-21 NOTE — Patient Instructions (Addendum)
Your physician wants you to follow-up in: [place]. You will receive a reminder letter in the mail two months in advance. If you don't receive a letter, please call our office to schedule the follow-up appointment.  Your physician has recommended you make the following change in your medication: STOP TAKING METOPROLOL   You have been referred to PULMONOLOGY   IF YOU NEED A REFILL ON ANY OF YOUR CARDIAC MEDICATIONS PLEASE LET Vivian.  Thank you for choosing [facility]!

## 2016-04-21 NOTE — Progress Notes (Signed)
SUBJECTIVE: The patient presents for follow up after undergoing coronary angiography. This demonstrated an ostial left main coronary artery lesion of 25% which was nonobstructive. The LAD was calcified without obstructive disease. The RCA had a mid vessel 25% stenosis. LVEDP was mildly elevated.  He continues to have shortness of breath. He has never smoked.  He has occasional chest twinges in the lower rib cage. He was hypokalemic and I supplemented him with potassium chloride. Potassium went from 3.2 up to 4.6.   Review of Systems: As per "subjective", otherwise negative.  Allergies  Allergen Reactions  . Other     [place] nuts - break out in hive throat swelling   . Sulfa Antibiotics   . Vancomycin     Current Outpatient Prescriptions  Medication Sig Dispense Refill  . aspirin 81 MG tablet Take 81 mg by mouth daily.      Marland Kitchen lisinopril (PRINIVIL,ZESTRIL) 20 MG tablet Take 1 tablet (20 mg total) by mouth daily. 90 tablet 3  . metoprolol tartrate (LOPRESSOR) 25 MG tablet Take 0.5 tablets (12.5 mg total) by mouth 2 (two) times daily. 90 tablet 3  . Multiple Vitamin (MULTIVITAMIN) tablet Take 1 tablet by mouth daily.    . nitroGLYCERIN (NITROSTAT) 0.4 MG SL tablet Place 1 tablet (0.4 mg total) under the tongue every 5 (five) minutes as needed. 25 tablet 3  . Omega-3 Fatty Acids (FISH OIL) 1200 MG CAPS Take 1 capsule by mouth 2 (two) times daily before a meal.     . potassium chloride SA (KLOR-CON M20) 20 MEQ tablet Take 1 tablet (20 mEq total) by mouth daily. 90 tablet 3  . simvastatin (ZOCOR) 20 MG tablet Take 10 mg by mouth at bedtime.      No current facility-administered medications for this visit.     Past Medical History:  Diagnosis Date  . GERD (gastroesophageal reflux disease)   . Hyperlipidemia   . Hypertension     Past Surgical History:  Procedure Laterality Date  . COLONOSCOPY  10/01/2010   Procedure: COLONOSCOPY;  Surgeon: Rogene Houston, MD;  [facility];  Service: Endoscopy;  Laterality: N/A;  . COLONOSCOPY N/A 09/01/2013   Procedure: COLONOSCOPY;  Surgeon: Rogene Houston, MD;  [facility];  Service: Endoscopy;  Laterality: N/A;  940  . ESOPHAGOGASTRODUODENOSCOPY    . ESOPHAGOGASTRODUODENOSCOPY N/A 04/11/2014   Procedure: ESOPHAGOGASTRODUODENOSCOPY (EGD);  Surgeon: Rogene Houston, MD;  [facility];  Service: Endoscopy;  Laterality: N/A;  1200 - moved to 12:55 - Ann to notify pt  . LEFT HEART CATH AND CORONARY ANGIOGRAPHY N/A 03/23/2016   Procedure: Left Heart Cath and Coronary Angiography;  Surgeon: Sherren Mocha, MD;  Location: [facility];  Service: Cardiovascular;  Laterality: N/A;  . right leg  20 years ago   X 8 secondary to fracture  . rt inguinal      hernia repair 2 months ago    Social History   Social History  . Marital status: Married    Spouse name: N/A  . Number of children: N/A  . Years of education: N/A   Occupational History  . Not on file.   Social History Main Topics  . Smoking status: Never Smoker  . Smokeless tobacco: Never Used  . Alcohol use 3.0 oz/week    5 Shots of liquor per week     Comment: 2 oz in the afternoon  . Drug use: No  .  Sexual activity: Not on file   Other Topics Concern  . Not on file   Social History Narrative  . No narrative on file     Vitals:   04/21/16 1417  BP: 124/82  Pulse: 64  SpO2: 99%  Weight: 223 lb (101.2 kg)  Height: 5\' 9"  (1.753 m)    PHYSICAL EXAM General: NAD HEENT: Normal. Neck: No JVD, no thyromegaly. Lungs: Clear to auscultation bilaterally with normal respiratory effort. CV: Nondisplaced PMI.  Regular rate and rhythm, normal S1/S2, no V7/K8, 2/6 pansystolic murmur over the right upper sternal border and along left sternal border. No pretibial or periankle edema.  Abdomen: Soft, nontender, obese, no distention.  Neurologic: Alert and oriented.  Psych: Normal affect. Skin: Normal. Musculoskeletal: No gross  deformities.    ECG: Most recent ECG reviewed.      ASSESSMENT AND PLAN: 1. Chest pain and exertional dyspnea with nonobstructive CAD: As detailed above, this is not due to obstructive coronary artery disease. He can continue aspirin 81 mg and simvastatin 20 mg. I will discontinue metoprolol. I will make a referral to pulmonology for further evaluation.  2. Aortic stenosis: Mild to moderate. Will repeat in 1.5-2 years.  3. Atrial fibrillation: This occurred in the perioperative setting at the time of hernia surgery.If he indeed has paroxysmal atrial fibrillation he would require anticoagulation given his CHADSVASC score of 3 (age, HTN). For now, will continue to monitor.  4. Hypertension: Controlled after increase of lisinopril to 20 mg daily. No changes.  5. Hypokalemia: He was hypokalemic and I supplemented him with potassium chloride. Potassium went from 3.2 up to 4.6.   Disposition: Follow-up in one year.   Kate Sable, M.D., F.A.C.C.

## 2016-04-30 DIAGNOSIS — H40053 Ocular hypertension, bilateral: Secondary | ICD-10-CM | POA: Diagnosis not present

## 2016-05-13 DIAGNOSIS — E7800 Pure hypercholesterolemia, unspecified: Secondary | ICD-10-CM | POA: Diagnosis not present

## 2016-05-13 DIAGNOSIS — I10 Essential (primary) hypertension: Secondary | ICD-10-CM | POA: Diagnosis not present

## 2016-05-18 DIAGNOSIS — H2513 Age-related nuclear cataract, bilateral: Secondary | ICD-10-CM | POA: Diagnosis not present

## 2016-05-18 DIAGNOSIS — H25013 Cortical age-related cataract, bilateral: Secondary | ICD-10-CM | POA: Diagnosis not present

## 2016-05-18 DIAGNOSIS — H2511 Age-related nuclear cataract, right eye: Secondary | ICD-10-CM | POA: Diagnosis not present

## 2016-05-18 DIAGNOSIS — H35033 Hypertensive retinopathy, bilateral: Secondary | ICD-10-CM | POA: Diagnosis not present

## 2016-05-18 DIAGNOSIS — H25011 Cortical age-related cataract, right eye: Secondary | ICD-10-CM | POA: Diagnosis not present

## 2016-05-18 DIAGNOSIS — H40013 Open angle with borderline findings, low risk, bilateral: Secondary | ICD-10-CM | POA: Diagnosis not present

## 2016-05-19 ENCOUNTER — Ambulatory Visit (INDEPENDENT_AMBULATORY_CARE_PROVIDER_SITE_OTHER): Payer: Medicare Other | Admitting: Internal Medicine

## 2016-05-19 ENCOUNTER — Encounter: Payer: Self-pay | Admitting: Internal Medicine

## 2016-05-19 VITALS — BP 136/82 | HR 89 | Ht 64.0 in | Wt 221.0 lb

## 2016-05-19 DIAGNOSIS — I10 Essential (primary) hypertension: Secondary | ICD-10-CM

## 2016-05-19 DIAGNOSIS — R0609 Other forms of dyspnea: Secondary | ICD-10-CM

## 2016-05-19 DIAGNOSIS — I209 Angina pectoris, unspecified: Secondary | ICD-10-CM

## 2016-05-19 MED ORDER — IRBESARTAN 150 MG PO TABS: 150.0000 mg | ORAL_TABLET | Freq: Every day | ORAL | 11 refills | Status: DC

## 2016-05-19 NOTE — Assessment & Plan Note (Signed)
Trial off acei 05/19/2016  Due to atypical sob/ throat clearing     In the best review of chronic cough to date ( NEJM 2016 375 [id number]) ,  ACEi are now felt to cause cough in up to  20% of pts which is a 4 fold increase from previous reports and does not include the variety of non-specific complaints we see in pulmonary clinic in pts on ACEi but previously attributed to another dx like  Copd/asthma and  include PNDS, throat and chest congestion, "bronchitis", unexplained dyspnea and noct "strangling" sensations, and hoarseness, but also  atypical /refractory GERD symptoms like dysphagia and "bad heartburn"   The only way I know  to prove this is not an "ACEi Case" is a trial off ACEi x a minimum of 6 weeks then regroup.   Try avapro 150 mg daily and return in 6 weeks if not better

## 2016-05-19 NOTE — Progress Notes (Signed)
Subjective:     Patient ID: [facility], male   DOB: [birth year],   MRN: [id number]  HPI  59 yowm never smoker asthma/eczema  as child but never required hosp/ outgrew as teenage but ever since prone to sneezing around dust with new onset wheezing / sob since around xmas 2017 and h/o IHD but cards did not feel sob was related so referred to pulmonary clinic 05/19/2016 by Dr   Raliegh Ip for unexplained sob with last cath 03/23/16 and LVEDP 20.   05/19/2016 1st [facility] office visit/ Jadien Lehigh  Unexplained sob on ACEi  Chief Complaint  Patient presents with  . Pulmonary Consult    Referred by Dr. [place]. Pt c/o SOB for the past 4 months. He states he is SOB with exertion such as walking in his yard or cleaning.   chronic sensation of pnds dating back years then audible wheezing with sob since xmas 2017 - the wheezing is better but still present s specific rx and sob sometimes at hs never sitting still and now walking dog around the block and much worse with hills whereas > 6 mo prior to OV  Could walk the dog same routine just fine.  Nasal symptoms do not correlate with breathing    No obvious day to day or daytime variability or assoc excess/ purulent sputum or mucus plugs or hemoptysis or cp or chest tightness, subjective wheeze or  r hb symptoms. No unusual exp hx or h/o childhood pna/ asthma or knowledge of premature birth.  Sleeping ok without nocturnal  or early am exacerbation  of respiratory  c/o's or need for noct saba. Also denies any obvious fluctuation of symptoms with weather or environmental changes or other aggravating or alleviating factors except as outlined above   Current Medications, Allergies, Complete Past Medical History, Past Surgical History, Family History, and Social History were reviewed in [employer] record.  ROS  The following are not active complaints unless bolded sore throat, dysphagia, dental problems, itching, sneezing,  nasal congestion  or excess/ purulent secretions, ear ache,   fever, chills, sweats, unintended wt loss, classically pleuritic or exertional cp,  orthopnea pnd or leg swelling, presyncope, palpitations, abdominal pain, anorexia, nausea, vomiting, diarrhea  or change in bowel or bladder habits, change in stools or urine, dysuria,hematuria,  rash, arthralgias, visual complaints, headache, numbness, weakness or ataxia or problems with walking or coordination,  change in mood/affect or memory.           Review of Systems     Objective:   Physical Exam     amb obese wm wm with freq throat clearing nad   Wt Readings from Last 3 Encounters:  05/19/16 221 lb (100.2 kg)  04/21/16 223 lb (101.2 kg)  03/23/16 221 lb (100.2 kg)    Vital signs reviewed  - Note on arrival 02 sats  100% on RA     HEENT: nl dentition, turbinates bilaterally, and oropharynx. Nl external ear canals without cough reflex   NECK :  without JVD/Nodes/TM/ nl carotid upstrokes bilaterally   LUNGS: no acc muscle use,  Nl contour chest which is clear to A and P bilaterally without cough on insp or exp maneuvers   CV:  RRR  no s3 or murmur or increase in P2, and no edema   ABD:  Obese soft and nontender with limited  inspiratory excursion in the supine position. No bruits or organomegaly appreciated, bowel sounds nl  MS:  Nl gait/ ext  warm without deformities, calf tenderness, cyanosis or clubbing No obvious joint restrictions   SKIN: warm and dry without lesions    NEURO:  alert, approp, nl sensorium with  no motor or cerebellar deficits apparent.     I personally reviewed images and agree with radiology impression as follows:  CXR:   03/20/16 No active cardiopulmonary disease. Elevated L HD since 2011 s change      Assessment:

## 2016-05-19 NOTE — Patient Instructions (Addendum)
Try off lisinopril and on avapro 150 mg daily x 6 weeks then return if not satisfied   Try prilosec otc 20mg   Take 30-60 min before first meal of the day and Pepcid ac (famotidine) 20 mg one @  bedtime until breathing and throat clearing improve then try off again to see if flares   GERD (REFLUX)  is an extremely common cause of respiratory symptoms just like yours , many times with no obvious heartburn at all.    It can be treated with medication, but also with lifestyle changes including elevation of the head of your bed (ideally with 6 inch  bed blocks),  Smoking cessation, avoidance of late meals, excessive alcohol, and avoid fatty foods, chocolate, peppermint, colas, red wine, and acidic juices such as orange juice.  NO MINT OR MENTHOL PRODUCTS SO NO COUGH DROPS   USE SUGARLESS CANDY INSTEAD (Jolley ranchers or Stover's or Life Savers) or even ice chips will also do - the key is to swallow to prevent all throat clearing. NO OIL BASED VITAMINS - use powdered substitutes.   Weight control is simply a matter of calorie balance which needs to be tilted in your favor by eating less and exercising more.  To get the most out of exercise, you need to be continuously aware that you are short of breath, but never out of breath, for 30 minutes daily. As you improve, it will actually be easier for you to do the same amount of exercise  in  30 minutes so always push to the level where you are short of breath.     Target weight =  175     If you are satisfied with your treatment plan,  let your doctor know and he/she can either refill your medications or you can return here when your prescription runs out.     If in any way you are not 100% satisfied,  please tell us.  If 100% better, tell your friends!  Pulmonary follow up is as needed

## 2016-05-19 NOTE — Assessment & Plan Note (Signed)
Body mass index is 37.93 kg/m.  -  trending down No results found for: TSH   Contributing to gerd risk/ doe/reviewed the need and the process to achieve and maintain neg calorie balance > defer f/u primary care including intermittently monitoring thyroid status

## 2016-05-19 NOTE — Assessment & Plan Note (Signed)
-  Elevated L HD since 02/13/09 CTa chest  -Cath 03/23/16 and LVEDP 20  -05/19/2016  Walked RA x 3 laps @ 185 ft each stopped due to  End of study, nl pace, mild sob - no  desat    - Spirometry 05/19/2016  FEV1 2.05 (89%)  Ratio 76 with classic upper airway truncation pattern - Trial off acei rec 05/19/2016 and short term GERD rx only   Symptoms are markedly disproportionate to objective findings and not clear this is a lung problem but pt does appear to have difficult airway management issues. DDX of  difficult airways management almost all start with A and  include Adherence, Ace Inhibitors, Acid Reflux, Active Sinus Disease, Alpha 1 Antitripsin deficiency, Anxiety masquerading as Airways dz,  ABPA,  Allergy(esp in young), Aspiration (esp in elderly), Adverse effects of meds,  Active smokers, A bunch of PE's (a small clot burden can't cause this syndrome unless there is already severe underlying pulm or vascular dz with poor reserve) plus two Bs  = Bronchiectasis and Beta blocker use..and one C= CHF   Adherence is always the initial "prime suspect" and is a multilayered concern that requires a "trust but verify" approach in every patient - starting with knowing how to use medications, especially inhalers, correctly, keeping up with refills and understanding the fundamental difference between maintenance and prns vs those medications only taken for a very short course and then stopped and not refilled.   ACEi effects at top of the usual list of suspects based on f/v loop and assoc throat clearing   ? Acid (or non-acid) GERD > always difficult to exclude as up to 75% of pts in some series report no assoc GI/ Heartburn symptoms> rec max (24h)  acid suppression short term  and diet restrictions/ reviewed and instructions given in writing.  Once off acei the minimal gerd symptoms may not be an issue so should try back off it  ? Allergy/ has rhinitis hx but no correlation with sob > allergy profile and FENO on  return if not improving   ? CHF >  Note lvedp 20 at recent heart cath and this was at rest > if dx remains elusive can do CPST as next step  Total time devoted to counseling  > 50 % of initial 60 min office visit:  review case with pt/wife discussion of options/alternatives/ personally creating written customized instructions  in presence of pt  then going over those specific  Instructions directly with the pt including how to use all of the meds but in particular covering each new medication in detail and the difference between the maintenance= "automatic" meds and the prns using an action plan format for the latter (If this problem/symptom => do that organization reading Left to right).  Please see AVS from this visit for a full list of these instructions which I personally wrote for this pt and  are unique to this visit.

## 2016-06-16 DIAGNOSIS — H25811 Combined forms of age-related cataract, right eye: Secondary | ICD-10-CM | POA: Diagnosis not present

## 2016-06-16 DIAGNOSIS — H2511 Age-related nuclear cataract, right eye: Secondary | ICD-10-CM | POA: Diagnosis not present

## 2016-06-24 DIAGNOSIS — I10 Essential (primary) hypertension: Secondary | ICD-10-CM | POA: Diagnosis not present

## 2016-06-24 DIAGNOSIS — E7800 Pure hypercholesterolemia, unspecified: Secondary | ICD-10-CM | POA: Diagnosis not present

## 2016-07-06 DIAGNOSIS — H25012 Cortical age-related cataract, left eye: Secondary | ICD-10-CM | POA: Diagnosis not present

## 2016-07-06 DIAGNOSIS — H2512 Age-related nuclear cataract, left eye: Secondary | ICD-10-CM | POA: Diagnosis not present

## 2016-07-14 DIAGNOSIS — H2512 Age-related nuclear cataract, left eye: Secondary | ICD-10-CM | POA: Diagnosis not present

## 2016-07-14 DIAGNOSIS — H25812 Combined forms of age-related cataract, left eye: Secondary | ICD-10-CM | POA: Diagnosis not present

## 2016-08-19 ENCOUNTER — Encounter: Payer: Self-pay | Admitting: Internal Medicine

## 2016-08-19 ENCOUNTER — Ambulatory Visit (INDEPENDENT_AMBULATORY_CARE_PROVIDER_SITE_OTHER): Payer: Medicare Other | Admitting: Internal Medicine

## 2016-08-19 VITALS — BP 132/82 | HR 79 | Ht 69.5 in | Wt 218.0 lb

## 2016-08-19 DIAGNOSIS — I10 Essential (primary) hypertension: Secondary | ICD-10-CM

## 2016-08-19 DIAGNOSIS — R05 Cough: Secondary | ICD-10-CM | POA: Diagnosis not present

## 2016-08-19 DIAGNOSIS — R0609 Other forms of dyspnea: Secondary | ICD-10-CM

## 2016-08-19 DIAGNOSIS — I209 Angina pectoris, unspecified: Secondary | ICD-10-CM | POA: Diagnosis not present

## 2016-08-19 DIAGNOSIS — R058 Other specified cough: Secondary | ICD-10-CM

## 2016-08-19 NOTE — Patient Instructions (Addendum)
GERD (REFLUX)  is an extremely common cause of respiratory symptoms just like yours , many times with no obvious heartburn at all.    It can be treated with medication, but also with lifestyle changes including elevation of the head of your bed (ideally with 6 inch  bed blocks),  Smoking cessation, avoidance of late meals, excessive alcohol, and avoid fatty foods, chocolate, peppermint, colas, red wine, and acidic juices such as orange juice.  NO MINT OR MENTHOL PRODUCTS SO NO COUGH DROPS   USE SUGARLESS CANDY INSTEAD (Jolley ranchers or Stover's or Life Savers) or even ice chips will also do - the key is to swallow to prevent all throat clearing. NO OIL BASED VITAMINS - use powdered substitutes.   Ok to try pepcid ac 20 mg after bfast and after supper x a couple of weeks then stop the pm pepcid  X 2 weeks then  Stop both.    If you are satisfied with your treatment plan,  let your doctor know and he/she can either refill your medications or you can return here when your prescription runs out.     If in any way you are not 100% satisfied,  please tell us.  If 100% better, tell your friends!  Pulmonary follow up is as needed

## 2016-08-19 NOTE — Assessment & Plan Note (Signed)
Trial off acei  05/19/16 plus gerd rx > much better 08/19/2016 so rec remain off acei and try taper acid suppression and if flares then need to consider GI eval   Although even in retrospect it may not be clear the ACEi contributed to the pt's symptoms,  Pt improved off them and adding them back at this point or in the future would risk confusion in interpretation of non-specific respiratory symptoms to which this patient is prone  ie  Better not to muddy the waters here.   Adequate control on present rx, reviewed in detail with pt > no change in rx needed  > Follow up per Primary Care planned

## 2016-08-19 NOTE — Progress Notes (Signed)
Subjective:     Patient ID: Shane Osborne, male   DOB: [birth year],   MRN: [id number]   Brief patient profile:  64 yowm never smoker asthma/eczema  as child but never required hosp/ outgrew as teenage but ever since prone to sneezing around dust with new onset wheezing / sob since around xmas 2017 and h/o IHD but cards did not feel sob was related so referred to pulmonary clinic 05/19/2016 by Dr   Raliegh Ip for unexplained sob with last cath 03/23/16 and LVEDP 20.     History of Present Illness  05/19/2016 1st [facility] office visit/ [name]  Unexplained sob on ACEi  Chief Complaint  Patient presents with  . Pulmonary Consult    Referred by Dr. Jacinta Shoe. Pt c/o SOB for the past 4 months. He states he is SOB with exertion such as walking in his yard or cleaning.   chronic sensation of pnds dating back years then audible wheezing with sob since xmas 2017 - the wheezing is better but still present s specific rx and sob sometimes at hs never sitting still and now walking dog around the block and much worse with hills whereas > 6 mo prior to OV  Could walk the dog same routine just fine.  Nasal symptoms do not correlate with breathing  rec Try off lisinopril and on avapro 150 mg daily x 6 weeks then return if not satisfied  Try prilosec otc 20mg   Take 30-60 min before first meal of the day and Pepcid ac (famotidine) 20 mg one @  bedtime until breathing and throat clearing improve then try off again to see if flares      08/19/2016  f/u ov/Challis Crill re:  Chief Complaint  Patient presents with  . Follow-up    Breathing has improved back to his normal baseline. No new co's.    walking dog fine now/ up hills  stlll some urge to clear the throat, not using candy as rec, using cough drops   Not limited by breathing from desired activities    No obvious day to day or daytime variability or assoc excess/ purulent sputum or mucus plugs or hemoptysis or cp or chest tightness, subjective wheeze or overt sinus  or hb symptoms. No unusual exp hx or h/o childhood pna/ asthma or knowledge of premature birth.  Sleeping ok without nocturnal  or early am exacerbation  of respiratory  c/o's or need for noct saba. Also denies any obvious fluctuation of symptoms with weather or environmental changes or other aggravating or alleviating factors except as outlined above   Current Medications, Allergies, Complete Past Medical History, Past Surgical History, Family History, and Social History were reviewed in [employer] record.  ROS  The following are not active complaints unless bolded sore throat, dysphagia, dental problems, itching, sneezing,  nasal congestion or excess/ purulent secretions, ear ache,   fever, chills, sweats, unintended wt loss, classically pleuritic or exertional cp,  orthopnea pnd or leg swelling, presyncope, palpitations, abdominal pain, anorexia, nausea, vomiting, diarrhea  or change in bowel or bladder habits, change in stools or urine, dysuria,hematuria,  rash, arthralgias, visual complaints, headache, numbness, weakness or ataxia or problems with walking or coordination,  change in mood/affect or memory.            Objective:   Physical Exam     amb obese wm  occ throat clearing but all smiles    08/19/2016       218   05/19/16  221 lb (100.2 kg)  04/21/16 223 lb (101.2 kg)  03/23/16 221 lb (100.2 kg)    Vital signs reviewed  - Note on arrival 02 sats  97% on RA     HEENT: nl dentition, turbinates bilaterally, and oropharynx. Nl external ear canals without cough reflex   NECK :  without JVD/Nodes/TM/ nl carotid upstrokes bilaterally   LUNGS: no acc muscle use,  Nl contour chest which is clear to A and P bilaterally without cough on insp or exp maneuvers   CV:  RRR  no s3 or murmur or increase in P2, and no edema   ABD:  Obese soft and nontender with limited  inspiratory excursion in the supine position. No bruits or organomegaly appreciated, bowel  sounds nl  MS:  Nl gait/ ext warm without deformities, calf tenderness, cyanosis or clubbing No obvious joint restrictions   SKIN: warm and dry without lesions    NEURO:  alert, approp, nl sensorium with  no motor or cerebellar deficits apparent.     I personally reviewed images and agree with radiology impression as follows:  CXR:   03/20/16 No active cardiopulmonary disease. Elevated L HD since 2011 s change      Assessment:       Outpatient Encounter Prescriptions as of 08/19/2016  Medication Sig  . aspirin 81 MG tablet Take 81 mg by mouth daily.    . famotidine (PEPCID) 20 MG tablet Take 20 mg by mouth at bedtime.  . irbesartan (AVAPRO) 150 MG tablet Take 1 tablet (150 mg total) by mouth daily.  . Multiple Vitamin (MULTIVITAMIN) tablet Take 1 tablet by mouth daily.  . nitroGLYCERIN (NITROSTAT) 0.4 MG SL tablet Place 1 tablet (0.4 mg total) under the tongue every 5 (five) minutes as needed.  . Omega-3 Fatty Acids (FISH OIL) 1200 MG CAPS Take 1 capsule by mouth 2 (two) times daily before a meal.   . omeprazole (PRILOSEC) 20 MG capsule Take 20 mg by mouth daily before breakfast.  . potassium chloride SA (KLOR-CON M20) 20 MEQ tablet Take 1 tablet (20 mEq total) by mouth daily.  . simvastatin (ZOCOR) 20 MG tablet Take 20 mg by mouth at bedtime.    No facility-administered encounter medications on file as of 08/19/2016.

## 2016-08-19 NOTE — Assessment & Plan Note (Signed)
Body mass index is 31.73 kg/m.  -  trending down No results found for: TSH   Contributing to gerd risk/ doe/reviewed the need and the process to achieve and maintain neg calorie balance > defer f/u primary care including intermittently monitoring thyroid status

## 2016-08-19 NOTE — Assessment & Plan Note (Signed)
-  Elevated L HD since 02/13/09 CTa chest  -Cath 03/23/16 and LVEDP 20  -05/19/2016  Walked RA x 3 laps @ 185 ft each stopped due to  End of study, nl pace, mild sob - no  desat    - Spirometry 05/19/2016  FEV1 2.05 (89%)  Ratio 76 with classic upper airway truncation pattern  - Trial off acei rec 05/19/2016 and short term GERD rx only  > resolved x for throat clearing (see uacs a/p)   Clearly ACEi related exac of uacs and only issue is whether will flare with taper off gerd rx   Pulmonary f/u is prn

## 2016-08-19 NOTE — Assessment & Plan Note (Signed)
Trial off acei  05/19/16 plus gerd rx > much better 08/19/2016 so rec remain off acei and try taper acid suppression and if flares then need to consider GI eval

## 2016-09-07 DIAGNOSIS — I10 Essential (primary) hypertension: Secondary | ICD-10-CM | POA: Diagnosis not present

## 2016-09-07 DIAGNOSIS — Z6833 Body mass index (BMI) 33.0-33.9, adult: Secondary | ICD-10-CM | POA: Diagnosis not present

## 2016-09-07 DIAGNOSIS — Z7189 Other specified counseling: Secondary | ICD-10-CM | POA: Diagnosis not present

## 2016-09-07 DIAGNOSIS — Z79899 Other long term (current) drug therapy: Secondary | ICD-10-CM | POA: Diagnosis not present

## 2016-09-07 DIAGNOSIS — R5383 Other fatigue: Secondary | ICD-10-CM | POA: Diagnosis not present

## 2016-09-07 DIAGNOSIS — Z1389 Encounter for screening for other disorder: Secondary | ICD-10-CM | POA: Diagnosis not present

## 2016-09-07 DIAGNOSIS — Z299 Encounter for prophylactic measures, unspecified: Secondary | ICD-10-CM | POA: Diagnosis not present

## 2016-09-07 DIAGNOSIS — Z1211 Encounter for screening for malignant neoplasm of colon: Secondary | ICD-10-CM | POA: Diagnosis not present

## 2016-09-07 DIAGNOSIS — Z0000 Encounter for general adult medical examination without abnormal findings: Secondary | ICD-10-CM | POA: Diagnosis not present

## 2016-09-07 DIAGNOSIS — E7800 Pure hypercholesterolemia, unspecified: Secondary | ICD-10-CM | POA: Diagnosis not present

## 2016-09-09 DIAGNOSIS — D225 Melanocytic nevi of trunk: Secondary | ICD-10-CM | POA: Diagnosis not present

## 2016-09-09 DIAGNOSIS — L218 Other seborrheic dermatitis: Secondary | ICD-10-CM | POA: Diagnosis not present

## 2016-09-09 DIAGNOSIS — L570 Actinic keratosis: Secondary | ICD-10-CM | POA: Diagnosis not present

## 2016-09-09 DIAGNOSIS — X32XXXD Exposure to sunlight, subsequent encounter: Secondary | ICD-10-CM | POA: Diagnosis not present

## 2016-11-04 DIAGNOSIS — Z23 Encounter for immunization: Secondary | ICD-10-CM | POA: Diagnosis not present

## 2016-11-11 DIAGNOSIS — K7689 Other specified diseases of liver: Secondary | ICD-10-CM | POA: Diagnosis not present

## 2016-11-11 DIAGNOSIS — Z125 Encounter for screening for malignant neoplasm of prostate: Secondary | ICD-10-CM | POA: Diagnosis not present

## 2017-01-11 DIAGNOSIS — Z299 Encounter for prophylactic measures, unspecified: Secondary | ICD-10-CM | POA: Diagnosis not present

## 2017-01-11 DIAGNOSIS — E876 Hypokalemia: Secondary | ICD-10-CM | POA: Diagnosis not present

## 2017-01-11 DIAGNOSIS — I10 Essential (primary) hypertension: Secondary | ICD-10-CM | POA: Diagnosis not present

## 2017-01-11 DIAGNOSIS — R945 Abnormal results of liver function studies: Secondary | ICD-10-CM | POA: Diagnosis not present

## 2017-01-11 DIAGNOSIS — Z6832 Body mass index (BMI) 32.0-32.9, adult: Secondary | ICD-10-CM | POA: Diagnosis not present

## 2017-01-11 DIAGNOSIS — E7800 Pure hypercholesterolemia, unspecified: Secondary | ICD-10-CM | POA: Diagnosis not present

## 2017-01-12 DIAGNOSIS — E7800 Pure hypercholesterolemia, unspecified: Secondary | ICD-10-CM | POA: Diagnosis not present

## 2017-01-12 DIAGNOSIS — R945 Abnormal results of liver function studies: Secondary | ICD-10-CM | POA: Diagnosis not present

## 2017-01-12 DIAGNOSIS — I10 Essential (primary) hypertension: Secondary | ICD-10-CM | POA: Diagnosis not present

## 2017-03-05 DIAGNOSIS — I10 Essential (primary) hypertension: Secondary | ICD-10-CM | POA: Diagnosis not present

## 2017-03-05 DIAGNOSIS — E7800 Pure hypercholesterolemia, unspecified: Secondary | ICD-10-CM | POA: Diagnosis not present

## 2017-03-30 DIAGNOSIS — I10 Essential (primary) hypertension: Secondary | ICD-10-CM | POA: Diagnosis not present

## 2017-03-30 DIAGNOSIS — E7800 Pure hypercholesterolemia, unspecified: Secondary | ICD-10-CM | POA: Diagnosis not present

## 2017-04-05 DIAGNOSIS — Z713 Dietary counseling and surveillance: Secondary | ICD-10-CM | POA: Diagnosis not present

## 2017-04-05 DIAGNOSIS — E7800 Pure hypercholesterolemia, unspecified: Secondary | ICD-10-CM | POA: Diagnosis not present

## 2017-04-05 DIAGNOSIS — I10 Essential (primary) hypertension: Secondary | ICD-10-CM | POA: Diagnosis not present

## 2017-04-05 DIAGNOSIS — R1031 Right lower quadrant pain: Secondary | ICD-10-CM | POA: Diagnosis not present

## 2017-04-05 DIAGNOSIS — Z299 Encounter for prophylactic measures, unspecified: Secondary | ICD-10-CM | POA: Diagnosis not present

## 2017-04-19 ENCOUNTER — Ambulatory Visit (INDEPENDENT_AMBULATORY_CARE_PROVIDER_SITE_OTHER): Payer: Medicare Other | Admitting: Cardiovascular Disease

## 2017-04-19 ENCOUNTER — Encounter: Payer: Self-pay | Admitting: Cardiovascular Disease

## 2017-04-19 VITALS — BP 143/82 | HR 89 | Ht 69.0 in | Wt 222.0 lb

## 2017-04-19 DIAGNOSIS — E876 Hypokalemia: Secondary | ICD-10-CM | POA: Diagnosis not present

## 2017-04-19 DIAGNOSIS — I498 Other specified cardiac arrhythmias: Secondary | ICD-10-CM

## 2017-04-19 DIAGNOSIS — I10 Essential (primary) hypertension: Secondary | ICD-10-CM

## 2017-04-19 DIAGNOSIS — I350 Nonrheumatic aortic (valve) stenosis: Secondary | ICD-10-CM | POA: Diagnosis not present

## 2017-04-19 DIAGNOSIS — I4891 Unspecified atrial fibrillation: Secondary | ICD-10-CM | POA: Diagnosis not present

## 2017-04-19 NOTE — Progress Notes (Signed)
SUBJECTIVE: The patient presents for follow-up of aortic stenosis.  He underwent coronary angiography in 2018 which demonstrated an ostial left main coronary artery lesion of 25% which was nonobstructive. The LAD was calcified without obstructive disease. The RCA had a mid vessel 25% stenosis. LVEDP was mildly elevated.  Echocardiogram on 03/18/16 demonstrated normal left ventricular systolic function, LVEF 02-77%, indeterminate diastolic function, mild to moderate aortic stenosis with a mean gradient of 16 mmHg, and mildly elevated pulmonary pressures, 36 mmHg.  The patient denies any symptoms of chest pain, palpitations, shortness of breath, lightheadedness, dizziness, leg swelling, orthopnea, PND, and syncope.  He felt his blood pressure might be slightly elevated today as his brother-in-law passed away earlier this morning of a cocaine overdose.  He was 73.  Personally reviewed the ECG performed in our office today which demonstrated sinus rhythm with frequent PVCs in a pattern of ventricular trigeminy.   Review of Systems: As per "subjective", otherwise negative.  Allergies  Allergen Reactions  . Other     [place] nuts - break out in hive throat swelling   . Sulfa Antibiotics   . Vancomycin     Current Outpatient Medications  Medication Sig Dispense Refill  . aspirin 81 MG tablet Take 81 mg by mouth daily.      . famotidine (PEPCID) 20 MG tablet Take 20 mg by mouth at bedtime.    . irbesartan (AVAPRO) 150 MG tablet Take 1 tablet (150 mg total) by mouth daily. 30 tablet 11  . Multiple Vitamin (MULTIVITAMIN) tablet Take 1 tablet by mouth daily.    . nitroGLYCERIN (NITROSTAT) 0.4 MG SL tablet Place 1 tablet (0.4 mg total) under the tongue every 5 (five) minutes as needed. 25 tablet 3  . Omega-3 Fatty Acids (FISH OIL) 1200 MG CAPS Take 1 capsule by mouth 2 (two) times daily before a meal.     . omeprazole (PRILOSEC) 20 MG capsule Take 20 mg by mouth daily before breakfast.      . potassium chloride SA (KLOR-CON M20) 20 MEQ tablet Take 1 tablet (20 mEq total) by mouth daily. 90 tablet 3  . simvastatin (ZOCOR) 20 MG tablet Take 20 mg by mouth at bedtime.      No current facility-administered medications for this visit.     Past Medical History:  Diagnosis Date  . GERD (gastroesophageal reflux disease)   . Hyperlipidemia   . Hypertension     Past Surgical History:  Procedure Laterality Date  . COLONOSCOPY  10/01/2010   Procedure: COLONOSCOPY;  Surgeon: Rogene Houston, MD;  Location: [facility];  Service: Endoscopy;  Laterality: N/A;  . COLONOSCOPY N/A 09/01/2013   Procedure: COLONOSCOPY;  Surgeon: Rogene Houston, MD;  Location: [facility];  Service: Endoscopy;  Laterality: N/A;  940  . ESOPHAGOGASTRODUODENOSCOPY    . ESOPHAGOGASTRODUODENOSCOPY N/A 04/11/2014   Procedure: ESOPHAGOGASTRODUODENOSCOPY (EGD);  Surgeon: Rogene Houston, MD;  Location: [facility];  Service: Endoscopy;  Laterality: N/A;  1200 - moved to 12:55 - Ann to notify pt  . LEFT HEART CATH AND CORONARY ANGIOGRAPHY N/A 03/23/2016   Procedure: Left Heart Cath and Coronary Angiography;  Surgeon: Sherren Mocha, MD;  Location: [facility];  Service: Cardiovascular;  Laterality: N/A;  . right leg  20 years ago   X 8 secondary to fracture  . rt inguinal      hernia repair 2 months ago    Social History   Socioeconomic History  .  Marital status: Married    Spouse name: Not on file  . Number of children: Not on file  . Years of education: Not on file  . Highest education level: Not on file  Occupational History  . Not on file  Social Needs  . Financial resource strain: Not on file  . Food insecurity:    Worry: Not on file    Inability: Not on file  . Transportation needs:    Medical: Not on file    Non-medical: Not on file  Tobacco Use  . Smoking status: Never Smoker  . Smokeless tobacco: Never Used  Substance and Sexual Activity  . Alcohol use: Yes    Alcohol/week:  3.0 oz    Types: 5 Shots of liquor per week    Comment: 2 oz in the afternoon  . Drug use: No  . Sexual activity: Not on file  Lifestyle  . Physical activity:    Days per week: Not on file    Minutes per session: Not on file  . Stress: Not on file  Relationships  . Social connections:    Talks on phone: Not on file    Gets together: Not on file    Attends religious service: Not on file    Active member of club or organization: Not on file    Attends meetings of clubs or organizations: Not on file    Relationship status: Not on file  . Intimate partner violence:    Fear of current or ex partner: Not on file    Emotionally abused: Not on file    Physically abused: Not on file    Forced sexual activity: Not on file  Other Topics Concern  . Not on file  Social History Narrative  . Not on file     Vitals:   04/19/17 1130  BP: (!) 143/82  Pulse: 89  Weight: 222 lb (100.7 kg)  Height: 5\' 9"  (1.753 m)    Wt Readings from Last 3 Encounters:  04/19/17 222 lb (100.7 kg)  08/19/16 218 lb (98.9 kg)  05/19/16 221 lb (100.2 kg)     PHYSICAL EXAM General: NAD HEENT: Normal. Neck: No JVD, no thyromegaly. Lungs: Clear to auscultation bilaterally with normal respiratory effort. CV: Regular rate and irregular rhythm with frequent premature contractions, normal S1/S2, no B3/Z3, 2/6 pansystolic murmur over the right upper sternal border and along left sternal border. No pretibial or periankle edema.  No carotid bruit.   Abdomen: Soft, nontender, no distention.  Neurologic: Alert and oriented.  Psych: Normal affect. Skin: Normal. Musculoskeletal: No gross deformities.    ECG: Most recent ECG reviewed.   Labs: Lab Results  Component Value Date/Time   K 4.6 04/01/2016 09:03 AM   BUN 14 04/01/2016 09:03 AM   CREATININE 1.03 04/01/2016 09:03 AM   HGB 14.9 03/20/2016 03:32 PM     Lipids: No results found for: LDLCALC, LDLDIRECT, CHOL, TRIG, HDL     ASSESSMENT AND  PLAN: 1.  Aortic stenosis: Mild to moderate in severity when last evaluated by echocardiogram.  Symptomatically stable.  I will repeat in 1-2 years.  2.  Paroxysmal atrial for ablation: This occurred in the perioperative setting at the time of hernia surgery.If he indeed has paroxysmal atrial fibrillation he would require anticoagulation given his CHADSVASC score of 3 (age, HTN).   He is symptomatically stable.  I will continue to monitor.  3.  Ventricular trigeminy: Symptomatically stable.  No cardiac testing is indicated at this  time.  Coronary angiogram detailed above.  4.  Hypertension: Blood pressure is mildly elevated.  His brother-in-law passed away earlier this morning.  5.  Hypokalemia: He takes supplemental potassium.   Disposition: Follow up 1 year.   Kate Sable, M.D., F.A.C.C.

## 2017-04-19 NOTE — Addendum Note (Signed)
Addended by: Laurine Blazer on: 04/19/2017 12:02 PM   Modules accepted: Orders

## 2017-04-19 NOTE — Patient Instructions (Signed)
Medication Instructions:   Stop Aspirin.  Stop Nitroglycerin.   Continue all other medications.    Labwork: none  Testing/Procedures: none  Follow-Up: Your physician wants you to follow up in:  1 year.  You will receive a reminder letter in the mail one-two months in advance.  If you don't receive a letter, please call our office to schedule the follow up appointment   Any Other Special Instructions Will Be Listed Below (If Applicable).  If you need a refill on your cardiac medications before your next appointment, please call your pharmacy.'

## 2017-05-03 DIAGNOSIS — I10 Essential (primary) hypertension: Secondary | ICD-10-CM | POA: Diagnosis not present

## 2017-05-03 DIAGNOSIS — E7800 Pure hypercholesterolemia, unspecified: Secondary | ICD-10-CM | POA: Diagnosis not present

## 2017-05-27 DIAGNOSIS — X32XXXD Exposure to sunlight, subsequent encounter: Secondary | ICD-10-CM | POA: Diagnosis not present

## 2017-05-27 DIAGNOSIS — L570 Actinic keratosis: Secondary | ICD-10-CM | POA: Diagnosis not present

## 2017-05-27 DIAGNOSIS — C44622 Squamous cell carcinoma of skin of right upper limb, including shoulder: Secondary | ICD-10-CM | POA: Diagnosis not present

## 2017-06-23 DIAGNOSIS — E7800 Pure hypercholesterolemia, unspecified: Secondary | ICD-10-CM | POA: Diagnosis not present

## 2017-06-23 DIAGNOSIS — I10 Essential (primary) hypertension: Secondary | ICD-10-CM | POA: Diagnosis not present

## 2017-06-24 DIAGNOSIS — Z85828 Personal history of other malignant neoplasm of skin: Secondary | ICD-10-CM | POA: Diagnosis not present

## 2017-06-24 DIAGNOSIS — Z08 Encounter for follow-up examination after completed treatment for malignant neoplasm: Secondary | ICD-10-CM | POA: Diagnosis not present

## 2017-07-02 DIAGNOSIS — E7800 Pure hypercholesterolemia, unspecified: Secondary | ICD-10-CM | POA: Diagnosis not present

## 2017-07-02 DIAGNOSIS — I10 Essential (primary) hypertension: Secondary | ICD-10-CM | POA: Diagnosis not present

## 2017-08-20 DIAGNOSIS — E7800 Pure hypercholesterolemia, unspecified: Secondary | ICD-10-CM | POA: Diagnosis not present

## 2017-08-20 DIAGNOSIS — I10 Essential (primary) hypertension: Secondary | ICD-10-CM | POA: Diagnosis not present

## 2017-09-02 DIAGNOSIS — H40053 Ocular hypertension, bilateral: Secondary | ICD-10-CM | POA: Diagnosis not present

## 2017-09-09 DIAGNOSIS — E7800 Pure hypercholesterolemia, unspecified: Secondary | ICD-10-CM | POA: Diagnosis not present

## 2017-09-09 DIAGNOSIS — Z0000 Encounter for general adult medical examination without abnormal findings: Secondary | ICD-10-CM | POA: Diagnosis not present

## 2017-09-09 DIAGNOSIS — B356 Tinea cruris: Secondary | ICD-10-CM | POA: Diagnosis not present

## 2017-09-09 DIAGNOSIS — Z79899 Other long term (current) drug therapy: Secondary | ICD-10-CM | POA: Diagnosis not present

## 2017-09-09 DIAGNOSIS — Z1339 Encounter for screening examination for other mental health and behavioral disorders: Secondary | ICD-10-CM | POA: Diagnosis not present

## 2017-09-09 DIAGNOSIS — Z125 Encounter for screening for malignant neoplasm of prostate: Secondary | ICD-10-CM | POA: Diagnosis not present

## 2017-09-09 DIAGNOSIS — Z299 Encounter for prophylactic measures, unspecified: Secondary | ICD-10-CM | POA: Diagnosis not present

## 2017-09-09 DIAGNOSIS — I10 Essential (primary) hypertension: Secondary | ICD-10-CM | POA: Diagnosis not present

## 2017-09-09 DIAGNOSIS — Z1331 Encounter for screening for depression: Secondary | ICD-10-CM | POA: Diagnosis not present

## 2017-09-09 DIAGNOSIS — R5383 Other fatigue: Secondary | ICD-10-CM | POA: Diagnosis not present

## 2017-09-09 DIAGNOSIS — Z6833 Body mass index (BMI) 33.0-33.9, adult: Secondary | ICD-10-CM | POA: Diagnosis not present

## 2017-09-09 DIAGNOSIS — Z1211 Encounter for screening for malignant neoplasm of colon: Secondary | ICD-10-CM | POA: Diagnosis not present

## 2017-09-09 DIAGNOSIS — Z7189 Other specified counseling: Secondary | ICD-10-CM | POA: Diagnosis not present

## 2017-09-09 DIAGNOSIS — Z789 Other specified health status: Secondary | ICD-10-CM | POA: Diagnosis not present

## 2017-09-14 DIAGNOSIS — Z6831 Body mass index (BMI) 31.0-31.9, adult: Secondary | ICD-10-CM | POA: Diagnosis not present

## 2017-09-14 DIAGNOSIS — I10 Essential (primary) hypertension: Secondary | ICD-10-CM | POA: Diagnosis not present

## 2017-09-14 DIAGNOSIS — Z299 Encounter for prophylactic measures, unspecified: Secondary | ICD-10-CM | POA: Diagnosis not present

## 2017-09-14 DIAGNOSIS — D045 Carcinoma in situ of skin of trunk: Secondary | ICD-10-CM | POA: Diagnosis not present

## 2017-09-17 DIAGNOSIS — I10 Essential (primary) hypertension: Secondary | ICD-10-CM | POA: Diagnosis not present

## 2017-09-17 DIAGNOSIS — E7800 Pure hypercholesterolemia, unspecified: Secondary | ICD-10-CM | POA: Diagnosis not present

## 2017-10-13 DIAGNOSIS — Z299 Encounter for prophylactic measures, unspecified: Secondary | ICD-10-CM | POA: Diagnosis not present

## 2017-10-13 DIAGNOSIS — Z6832 Body mass index (BMI) 32.0-32.9, adult: Secondary | ICD-10-CM | POA: Diagnosis not present

## 2017-10-13 DIAGNOSIS — C44529 Squamous cell carcinoma of skin of other part of trunk: Secondary | ICD-10-CM | POA: Diagnosis not present

## 2017-10-14 DIAGNOSIS — I10 Essential (primary) hypertension: Secondary | ICD-10-CM | POA: Diagnosis not present

## 2017-10-14 DIAGNOSIS — E7800 Pure hypercholesterolemia, unspecified: Secondary | ICD-10-CM | POA: Diagnosis not present

## 2017-10-27 ENCOUNTER — Other Ambulatory Visit: Payer: Self-pay | Admitting: Cardiovascular Disease

## 2017-10-27 DIAGNOSIS — I359 Nonrheumatic aortic valve disorder, unspecified: Secondary | ICD-10-CM

## 2017-10-27 DIAGNOSIS — I4891 Unspecified atrial fibrillation: Secondary | ICD-10-CM

## 2017-11-11 ENCOUNTER — Other Ambulatory Visit: Payer: Self-pay

## 2017-11-11 ENCOUNTER — Ambulatory Visit (INDEPENDENT_AMBULATORY_CARE_PROVIDER_SITE_OTHER): Payer: Medicare Other

## 2017-11-11 DIAGNOSIS — I359 Nonrheumatic aortic valve disorder, unspecified: Secondary | ICD-10-CM

## 2017-11-11 DIAGNOSIS — I4891 Unspecified atrial fibrillation: Secondary | ICD-10-CM | POA: Diagnosis not present

## 2017-11-15 ENCOUNTER — Telehealth: Payer: Self-pay | Admitting: *Deleted

## 2017-11-15 NOTE — Telephone Encounter (Signed)
Notes recorded by Laurine Blazer, LPN on 48/18/5909 at 5:13 PM EDT Patient notified. Copy to pmd. ------  Notes recorded by Herminio Commons, MD on 11/11/2017 at 12:44 PM EDT This study demonstrates: Normal pumping function, calcium buildup on aortic valve leading to mild to moderate narrowing. Medication changes / Follow up studies / Other recommendations:  Continue present therapies. Please send results to the PCP: Glenda Chroman, MD  Kate Sable, MD 11/11/2017 12:43 PM

## 2017-11-17 DIAGNOSIS — I10 Essential (primary) hypertension: Secondary | ICD-10-CM | POA: Diagnosis not present

## 2017-11-17 DIAGNOSIS — E7800 Pure hypercholesterolemia, unspecified: Secondary | ICD-10-CM | POA: Diagnosis not present

## 2017-12-14 DIAGNOSIS — I10 Essential (primary) hypertension: Secondary | ICD-10-CM | POA: Diagnosis not present

## 2017-12-14 DIAGNOSIS — E7800 Pure hypercholesterolemia, unspecified: Secondary | ICD-10-CM | POA: Diagnosis not present

## 2017-12-21 DIAGNOSIS — Z23 Encounter for immunization: Secondary | ICD-10-CM | POA: Diagnosis not present

## 2018-01-07 DIAGNOSIS — E7800 Pure hypercholesterolemia, unspecified: Secondary | ICD-10-CM | POA: Diagnosis not present

## 2018-01-07 DIAGNOSIS — I10 Essential (primary) hypertension: Secondary | ICD-10-CM | POA: Diagnosis not present

## 2018-01-22 IMAGING — DX DG CHEST 2V
2 series · 2 of 2 positions shown · non-contrast
Comparison: 06/24/2013

CLINICAL DATA: Chest pain and shortness of breath for 2 months.
Previous myocardial infarct.

EXAM:
CHEST  2 VIEW

[chest pa]
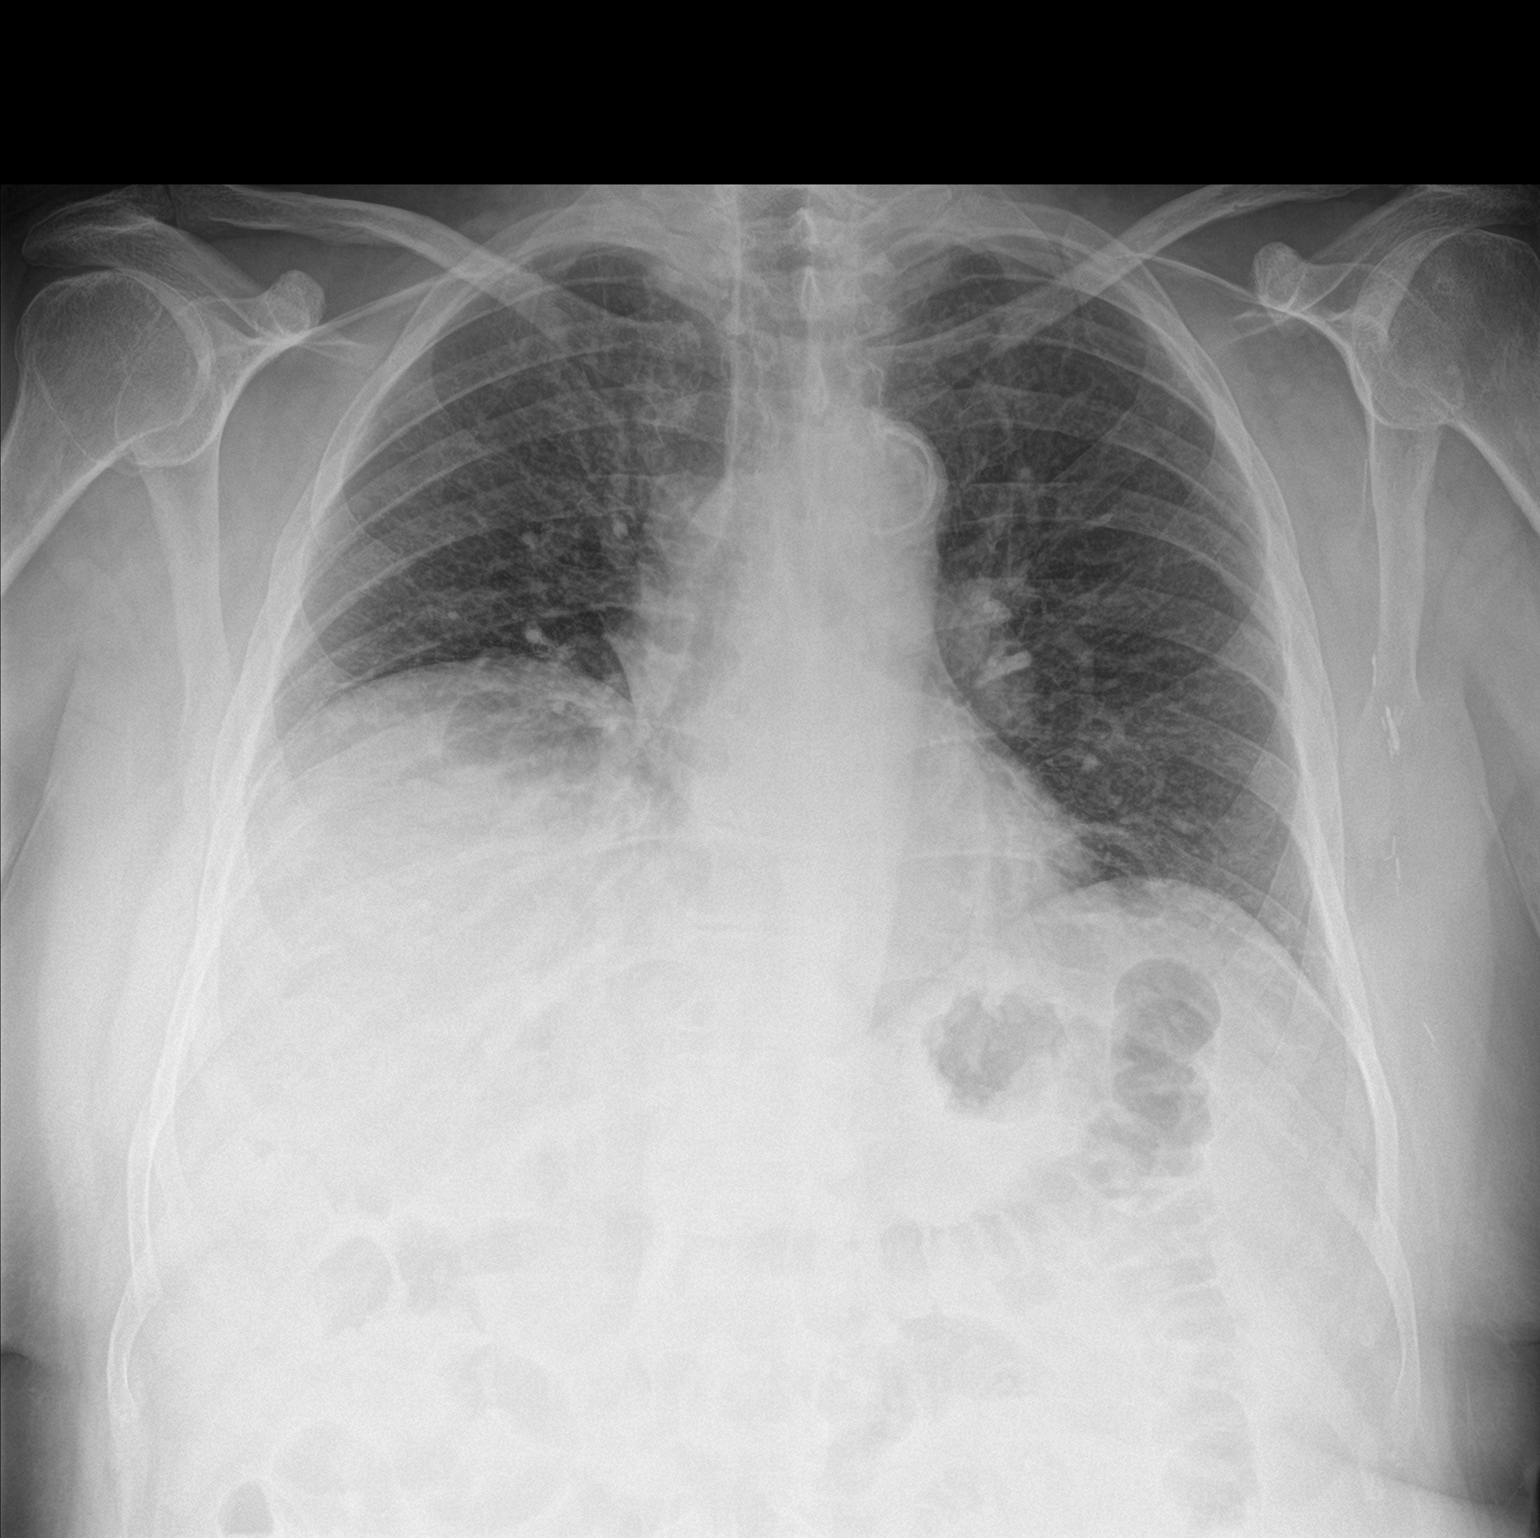

[chest lat]
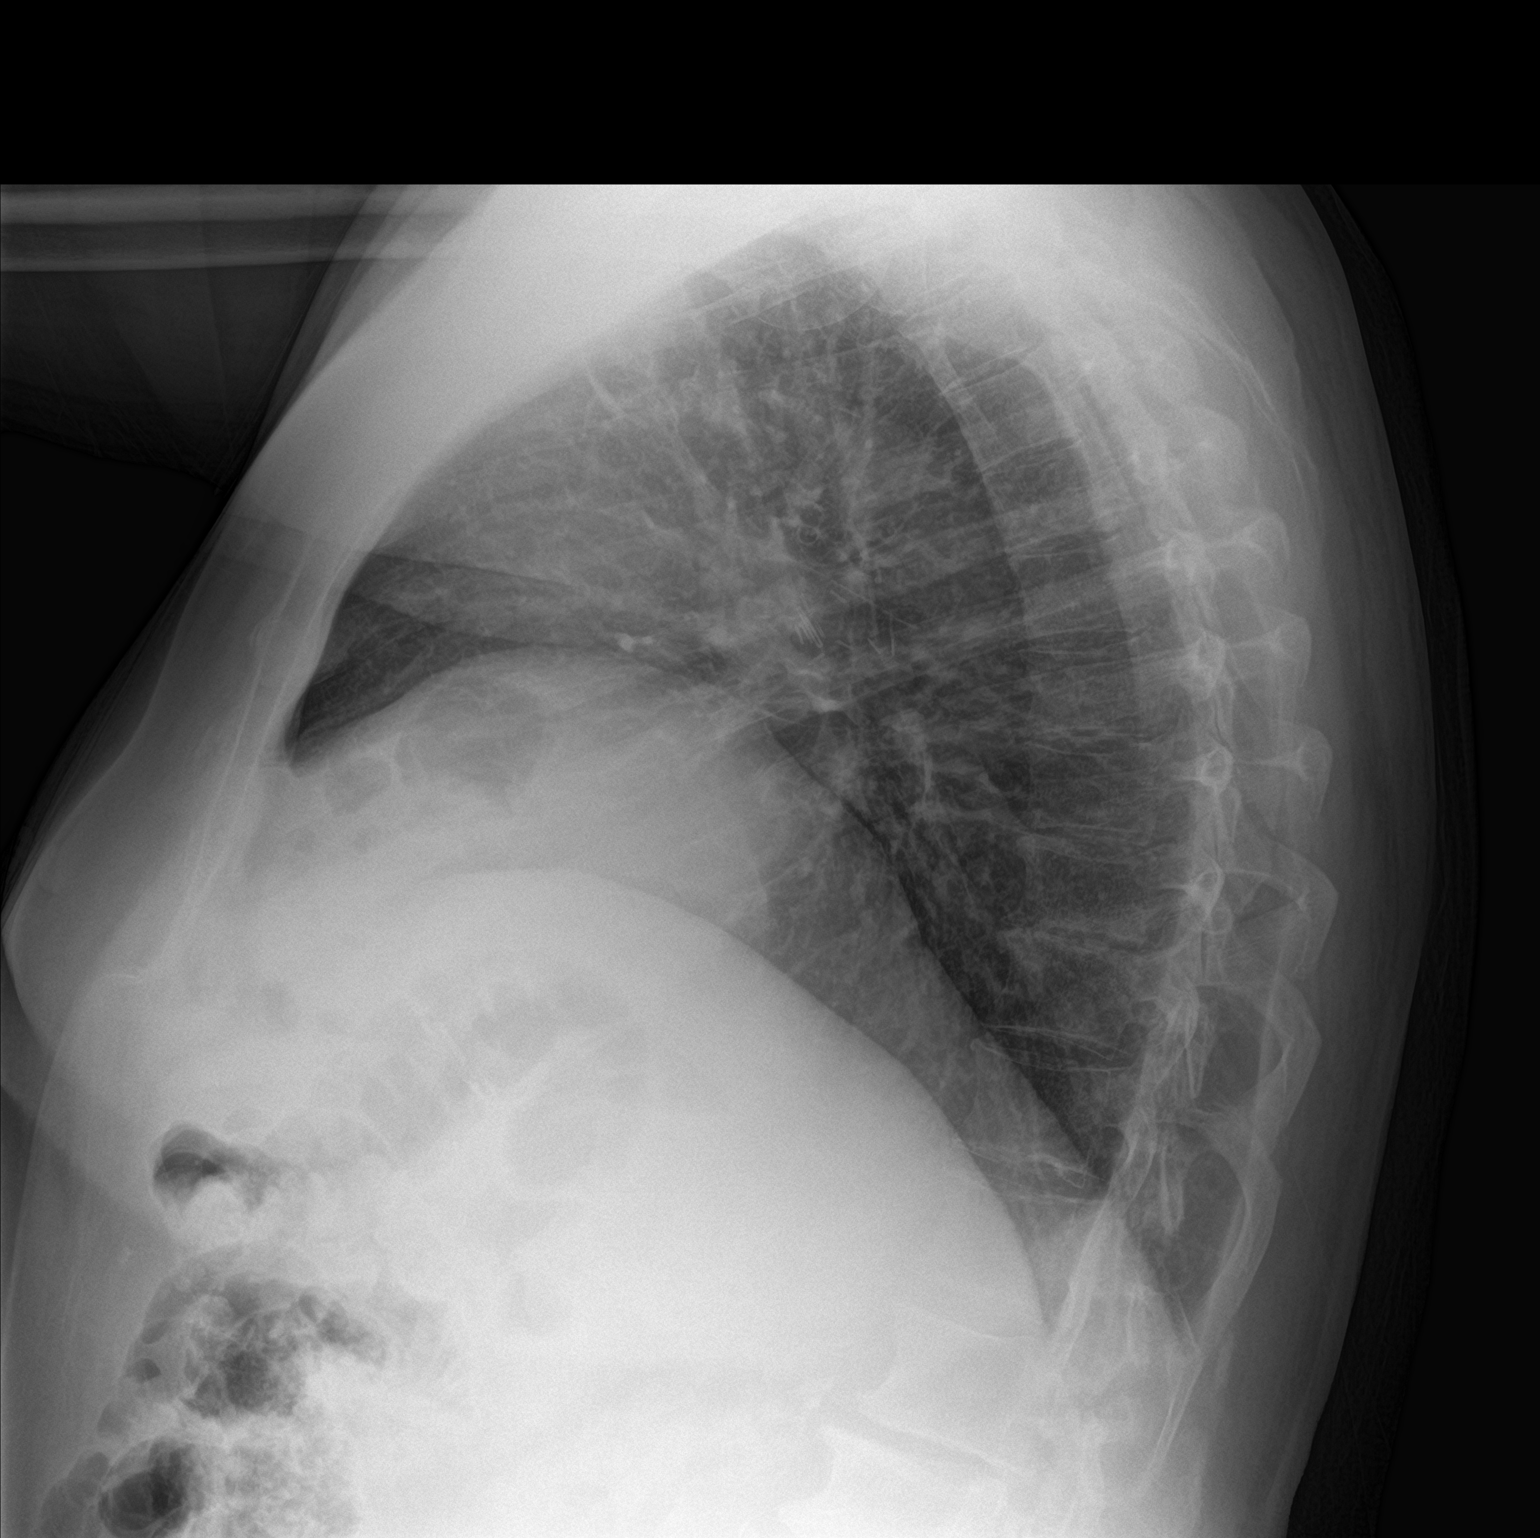

[2 of 2 positions shown; findings below may reference images not displayed]

FINDINGS: The heart size and mediastinal contours are within normal limits.
Aortic atherosclerosis.

Stable elevation of right hemidiaphragm. No evidence of pulmonary
infiltrate or pleural effusion. Surgical clips again seen within the
left axilla.
IMPRESSION: No active cardiopulmonary disease.

## 2018-02-09 DIAGNOSIS — Z789 Other specified health status: Secondary | ICD-10-CM | POA: Diagnosis not present

## 2018-02-09 DIAGNOSIS — I10 Essential (primary) hypertension: Secondary | ICD-10-CM | POA: Diagnosis not present

## 2018-02-09 DIAGNOSIS — Z299 Encounter for prophylactic measures, unspecified: Secondary | ICD-10-CM | POA: Diagnosis not present

## 2018-02-09 DIAGNOSIS — Z6833 Body mass index (BMI) 33.0-33.9, adult: Secondary | ICD-10-CM | POA: Diagnosis not present

## 2018-02-17 DIAGNOSIS — I10 Essential (primary) hypertension: Secondary | ICD-10-CM | POA: Diagnosis not present

## 2018-02-17 DIAGNOSIS — E7800 Pure hypercholesterolemia, unspecified: Secondary | ICD-10-CM | POA: Diagnosis not present

## 2018-03-21 DIAGNOSIS — I10 Essential (primary) hypertension: Secondary | ICD-10-CM | POA: Diagnosis not present

## 2018-03-21 DIAGNOSIS — E7800 Pure hypercholesterolemia, unspecified: Secondary | ICD-10-CM | POA: Diagnosis not present

## 2018-04-18 ENCOUNTER — Telehealth: Payer: Self-pay | Admitting: Cardiovascular Disease

## 2018-04-18 NOTE — Telephone Encounter (Signed)
I called and spoke with the patient about rescheduling his appointment given the COVID19 pandemic, in order to avoid unnecessary exposure.  He is stable from a cardiac standpoint with respect to symptoms and is very agreeable to having his appointment rescheduled to a later date and was thankful for the call. 

## 2018-04-18 NOTE — Telephone Encounter (Signed)
Patient rescheduled to 07/28/2018.

## 2018-04-19 DIAGNOSIS — E7800 Pure hypercholesterolemia, unspecified: Secondary | ICD-10-CM | POA: Diagnosis not present

## 2018-04-19 DIAGNOSIS — I10 Essential (primary) hypertension: Secondary | ICD-10-CM | POA: Diagnosis not present

## 2018-04-22 ENCOUNTER — Ambulatory Visit: Payer: Medicare Other | Admitting: Cardiovascular Disease

## 2018-05-11 DIAGNOSIS — I10 Essential (primary) hypertension: Secondary | ICD-10-CM | POA: Diagnosis not present

## 2018-05-11 DIAGNOSIS — R32 Unspecified urinary incontinence: Secondary | ICD-10-CM | POA: Diagnosis not present

## 2018-05-11 DIAGNOSIS — Z299 Encounter for prophylactic measures, unspecified: Secondary | ICD-10-CM | POA: Diagnosis not present

## 2018-05-11 DIAGNOSIS — Z6833 Body mass index (BMI) 33.0-33.9, adult: Secondary | ICD-10-CM | POA: Diagnosis not present

## 2018-05-11 DIAGNOSIS — N399 Disorder of urinary system, unspecified: Secondary | ICD-10-CM | POA: Diagnosis not present

## 2018-05-17 DIAGNOSIS — E7800 Pure hypercholesterolemia, unspecified: Secondary | ICD-10-CM | POA: Diagnosis not present

## 2018-05-17 DIAGNOSIS — I10 Essential (primary) hypertension: Secondary | ICD-10-CM | POA: Diagnosis not present

## 2018-05-20 DIAGNOSIS — R102 Pelvic and perineal pain: Secondary | ICD-10-CM | POA: Diagnosis not present

## 2018-05-23 ENCOUNTER — Telehealth: Payer: Self-pay | Admitting: Cardiology

## 2018-05-23 NOTE — Telephone Encounter (Signed)
Patient asking to be seen due to BP & SOB

## 2018-05-23 NOTE — Telephone Encounter (Signed)
Reports checking BP several times per day after recent elevated readings. Reports some readings being normal and some are elevated. This past Saturday BP was 200/109. Saw PCP 2 weeks ago and BP was elevated 154/90, saw urologist Friday 161/90. Also c/o sob that is worse than before. C/O discomfort in left shoulder but says he has a bad rotator cuff. Denies dizziness. Reports having an uncomfortable feeling in his chest rated 4/10 & described as heaviness Reports walking 30 minutes twice daily. Virtual (phone) appointment scheduled for 05/24/18 @2 :30 pm with [place]. Advised if symptoms get worse, to go to the ED for an evaluation. Verbalized understanding of plan.

## 2018-05-24 ENCOUNTER — Encounter: Payer: Self-pay | Admitting: Student

## 2018-05-24 ENCOUNTER — Telehealth (INDEPENDENT_AMBULATORY_CARE_PROVIDER_SITE_OTHER): Payer: Medicare Other | Admitting: Student

## 2018-05-24 VITALS — BP 159/94 | HR 88 | Ht 69.0 in | Wt 220.0 lb

## 2018-05-24 DIAGNOSIS — I350 Nonrheumatic aortic (valve) stenosis: Secondary | ICD-10-CM

## 2018-05-24 DIAGNOSIS — I10 Essential (primary) hypertension: Secondary | ICD-10-CM | POA: Diagnosis not present

## 2018-05-24 DIAGNOSIS — Z7189 Other specified counseling: Secondary | ICD-10-CM

## 2018-05-24 DIAGNOSIS — Z79899 Other long term (current) drug therapy: Secondary | ICD-10-CM

## 2018-05-24 MED ORDER — IRBESARTAN 300 MG PO TABS: 300.0000 mg | ORAL_TABLET | Freq: Every day | ORAL | 11 refills | Status: DC

## 2018-05-24 NOTE — Progress Notes (Signed)
Virtual Visit via Telephone Note   This visit type was conducted due to national recommendations for restrictions regarding the COVID-19 Pandemic (e.g. social distancing) in an effort to limit this patient's exposure and mitigate transmission in our community.  Due to his co-morbid illnesses, this patient is at least at moderate risk for complications without adequate follow up.  This format is felt to be most appropriate for this patient at this time.  The patient did not have access to video technology/had technical difficulties with video requiring transitioning to audio format only (telephone).  All issues noted in this document were discussed and addressed.  No physical exam could be performed with this format.  Please refer to the patient's chart for his  consent to telehealth for [facility].   Evaluation Performed:  Follow-up visit  Date:  05/24/2018   ID:  Shane Osborne, Shane Osborne 1940-09-28, MRN [id number]  Patient Location: Home Provider Location: Home  PCP:  Glenda Chroman, MD  Cardiologist:  Kate Sable, MD  Electrophysiologist:  None   Chief Complaint:  Elevated BP  History of Present Illness:    Shane Osborne is a 78 y.o. male with past medical history of nonobstructive CAD by catheterization in 02/2016, mild to moderate AS, HTN, and HLD.   He was last examined by Dr. Bronson Ing in 03/2017 and denied any recent chest pain or dyspnea on exertion at that time. No changes were made to his medication regimen and he was informed to follow-up in 1 year. He did have a repeat echocardiogram in 10/2017 which showed a preserved EF of 60-65% and his aortic stenosis remained mild to moderate.   His appointment in 03/2018 was initially postponed due to COVID-19 but he called the office on 05/23/2018 reporting elevated BP with associated dyspnea and chest discomfort. BP had been elevated from 154/90 to 200/109. Was advised to go to the ED if symptoms worsened and a Virtual Visit was  arranged for further evaluation.   In talking with the patient today, he reports his BP had been well-controlled until several weeks ago when he started obtaining elevated readings as outlined above. Initially thought it was his BP cuff but BP was also elevated when checked at his Urologist. Reports good compliance with his current regimen and denies missing any recent doses.   He reports having dyspnea with over-exerting himself and this has been present for several years but he thinks symptoms have worsened over the past 2-3 months. Prior notes mentioned chest discomfort but he denies any recent symptoms. No recent orthopnea, PND, or lower extremity edema. No recent palpitations. He does have a history of post-op atrial fibrillation but no known recurrence since. HR has been well-controlled when checked at home per his report.   The patient does not have symptoms concerning for COVID-19 infection (fever, chills, cough, or new shortness of breath).    Past Medical History:  Diagnosis Date  . GERD (gastroesophageal reflux disease)   . Hyperlipidemia   . Hypertension    Past Surgical History:  Procedure Laterality Date  . COLONOSCOPY  10/01/2010   Procedure: COLONOSCOPY;  Surgeon: Rogene Houston, MD;  [facility];  Service: Endoscopy;  Laterality: N/A;  . COLONOSCOPY N/A 09/01/2013   Procedure: COLONOSCOPY;  Surgeon: Rogene Houston, MD;  Location[facility];  Service: Endoscopy;  Laterality: N/A;  940  . ESOPHAGOGASTRODUODENOSCOPY    . ESOPHAGOGASTRODUODENOSCOPY N/A 04/11/2014   Procedure: ESOPHAGOGASTRODUODENOSCOPY (EGD);  Surgeon: Rogene Houston,  MD;  Location: [facility];  Service: Endoscopy;  Laterality: N/A;  1200 - moved to 12:55 - Ann to notify pt  . LEFT HEART CATH AND CORONARY ANGIOGRAPHY N/A 03/23/2016   Procedure: Left Heart Cath and Coronary Angiography;  Surgeon: Sherren Mocha, MD;  Location: [place] [facility];  Service: Cardiovascular;  Laterality: N/A;  .  right leg  20 years ago   X 8 secondary to fracture  . rt inguinal      hernia repair 2 months ago     Current Meds  Medication Sig  . Multiple Vitamin (MULTIVITAMIN) tablet Take 1 tablet by mouth daily.  . Omega-3 Fatty Acids (FISH OIL) 1200 MG CAPS Take 1 capsule by mouth 2 (two) times daily before a meal.   . potassium chloride SA (KLOR-CON M20) 20 MEQ tablet Take 1 tablet (20 mEq total) by mouth daily.  . simvastatin (ZOCOR) 20 MG tablet Take 20 mg by mouth at bedtime.   . tamsulosin (FLOMAX) 0.4 MG CAPS capsule Take 0.4 mg by mouth at bedtime.  . [DISCONTINUED] irbesartan (AVAPRO) 150 MG tablet Take 1 tablet (150 mg total) by mouth daily.     Allergies:   Other; Sulfa antibiotics; and Vancomycin   Social History   Tobacco Use  . Smoking status: Never Smoker  . Smokeless tobacco: Never Used  Substance Use Topics  . Alcohol use: Yes    Alcohol/week: 5.0 standard drinks    Types: 5 Shots of liquor per week    Comment: 2 oz in the afternoon  . Drug use: No     Family Hx: The patient's family history includes Cancer in his mother; Heart attack in his father.  ROS:   Please see the history of present illness.     All other systems reviewed and are negative.   Prior CV studies:   The following studies were reviewed today:  Cardiac Catheterization: 02/2016 1. Mild CAD with nonobstructive left main and RCA stenosis 2. Calcified coronary arteries without significant stenoses 3. Mildly elevated LVEDP  Echocardiogram: 10/2017 Study Conclusions  - Left ventricle: The cavity size was normal. Systolic function was   normal. The estimated ejection fraction was in the range of 60%   to 65%. Wall motion was normal; there were no regional wall   motion abnormalities. Doppler parameters are consistent with   abnormal left ventricular relaxation (grade 1 diastolic   dysfunction). - Aortic valve: Moderately calcified annulus. Trileaflet; mildly   calcified leaflets. There  was mild to moderate stenosis. Valve   area (VTI): 1.09 cm^2. Valve area (Vmax): 1.12 cm^2. Valve area   (Vmean): 1.21 cm^2. - Tricuspid valve: There was mild regurgitation.  Labs/Other Tests and Data Reviewed:    EKG:  No ECG reviewed.  Recent Labs: No results found for requested labs within last 8760 hours.   Recent Lipid Panel No results found for: CHOL, TRIG, HDL, CHOLHDL, LDLCALC, LDLDIRECT  Wt Readings from Last 3 Encounters:  05/24/18 220 lb (99.8 kg)  04/19/17 222 lb (100.7 kg)  08/19/16 218 lb (98.9 kg)     Objective:    Vital Signs:  BP (!) 159/94   Pulse 88   Ht 5\' 9"  (1.753 m)   Wt 220 lb (99.8 kg)   BMI 32.49 kg/m    General: Pleasant male sounding in NAD Psych: Normal affect. Neuro: Alert and oriented X 3.  Lungs:  Resp regular and unlabored while talking on the phone.    ASSESSMENT & PLAN:  1. HTN - BP has been elevated as outlined above by readings obtained on his home cuff and at his other appointments. He reports good compliance with Irbesartan 150mg  daily. We discussed options in regards to titrating this with repeat labs in 2 weeks versus adding another medication. He prefers titration of his current regimen for now, therefore will increase Irbesartan to 300mg  daily with plans for a repeat BMET in 2 weeks. If BP remains elevated, would plan to add Amlodipine or Coreg (given frequent ectopy in the past) to his current regimen.   2. Aortic Stenosis - mild to moderate by echo in 10/2017. He does report dyspnea on exertion which has been present for quite some time per his report but has worsened over the past few months. Will plan to obtain better BP control as outlined above. If symptoms do not improve, would consider a repeat echocardiogram at the time of his follow-up visit.   3. COVID-19 Education The signs and symptoms of COVID-19 were discussed with the patient. The importance of social distancing was discussed today.  Time:   Today, I have  spent 21 minutes with the patient with telehealth technology discussing the above problems.     Medication Adjustments/Labs and Tests Ordered: Current medicines are reviewed at length with the patient today.  Concerns regarding medicines are outlined above.   Tests Ordered: Orders Placed This Encounter  Procedures  . Basic Metabolic Panel (BMET)    Medication Changes: Meds ordered this encounter  Medications  . irbesartan (AVAPRO) 300 MG tablet    Sig: Take 1 tablet (300 mg total) by mouth daily.    Dispense:  30 tablet    Refill:  11    Disposition:  Patient will keep BP log and report back with readings within a few weeks. Follow up with Dr. Bronson Ing in 2-3 months.   Signed, Erma Heritage, PA-C  05/24/2018 6:57 PM    [facility]

## 2018-05-24 NOTE — Patient Instructions (Signed)
Medication Instructions:  Your physician has recommended you make the following change in your medication: Increase Irbesartan to 300 mg Daily    Labwork: Your physician recommends that you return for lab work in:2 Weeks    Testing/Procedures: NONE  Follow-Up: Your physician recommends that you schedule a follow-up appointment in: July with Dr. Bronson Ing.   Any Other Special Instructions Will Be Listed Below (If Applicable).  Your physician has requested that you regularly monitor and record your blood pressure readings at home. Please use the same machine at the same time of day to check your readings and record them and send to our office.    If you need a refill on your cardiac medications before your next appointment, please call your pharmacy.  Thank you for choosing [facility]!

## 2018-06-06 ENCOUNTER — Telehealth: Payer: Self-pay | Admitting: Cardiovascular Disease

## 2018-06-06 ENCOUNTER — Other Ambulatory Visit: Payer: Self-pay

## 2018-06-06 ENCOUNTER — Other Ambulatory Visit (HOSPITAL_COMMUNITY)
Admission: RE | Admit: 2018-06-06 | Discharge: 2018-06-06 | Disposition: A | Discharge status: Home / Self Care | Payer: Medicare Other | Source: Ambulatory Visit | Attending: Student | Admitting: Student

## 2018-06-06 DIAGNOSIS — Z79899 Other long term (current) drug therapy: Secondary | ICD-10-CM | POA: Insufficient documentation

## 2018-06-06 LAB — BASIC METABOLIC PANEL
Anion gap: 9 (ref 5–15)
BUN: 19 mg/dL (ref 8–23)
CO2: 27 mmol/L (ref 22–32)
Calcium: 9 mg/dL (ref 8.9–10.3)
Chloride: 105 mmol/L (ref 98–111)
Creatinine, Ser: 1.11 mg/dL (ref 0.61–1.24)
GFR calc Af Amer: >60 mL/min (ref >60)
GFR calc non Af Amer: >60 mL/min (ref >60)
Glucose, Bld: 113 mg/dL — ABNORMAL HIGH (ref 70–99)
Potassium: 3.8 mmol/L (ref 3.5–5.1)
Sodium: 141 mmol/L (ref 135–145)

## 2018-06-06 NOTE — Telephone Encounter (Signed)
    If the previous list shows his most recent readings at the bottom, the BP has improved as compared to the prior readings at his last visit. Given that we just titrated his medication less than 2 weeks ago (he has lab results available as well I just resulted), would continue his current medication regimen at this time. Continue to check BP 1-2 times daily and report back if readings remain elevated.   Signed, Erma Heritage, PA-C 06/06/2018, 3:27 PM Pager: [phone]

## 2018-06-06 NOTE — Telephone Encounter (Signed)
Please give pt a call concerning BP readings [phone]

## 2018-06-06 NOTE — Telephone Encounter (Signed)
BP's: 137/83 HR 89  154/99 HR 95  151/80  HR 91  146/91 HR 98  160/99 HR 84  124/79 HR 94  149/802 HR 97  153/84 HR 86  166/91 HR 83  161/90 HR 82  132/80 HR 96  142/86 HR 84  141/82 HR 74  154/83 HR 83  132/73 HR 86  125/68  HR 88  130/78 HR 107  139/76 HR 92  131/78 HR 93  151/89 HR 82  139/84 HR 88

## 2018-06-06 NOTE — Telephone Encounter (Signed)
I spoke with wife, they will continue to check BP 1-2 times a day, no medications changes, and labs are stable

## 2018-06-15 DIAGNOSIS — I10 Essential (primary) hypertension: Secondary | ICD-10-CM | POA: Diagnosis not present

## 2018-06-15 DIAGNOSIS — E7800 Pure hypercholesterolemia, unspecified: Secondary | ICD-10-CM | POA: Diagnosis not present

## 2018-06-23 DIAGNOSIS — N3943 Post-void dribbling: Secondary | ICD-10-CM | POA: Diagnosis not present

## 2018-06-23 DIAGNOSIS — R102 Pelvic and perineal pain: Secondary | ICD-10-CM | POA: Diagnosis not present

## 2018-07-11 DIAGNOSIS — L700 Acne vulgaris: Secondary | ICD-10-CM | POA: Diagnosis not present

## 2018-07-11 DIAGNOSIS — L570 Actinic keratosis: Secondary | ICD-10-CM | POA: Diagnosis not present

## 2018-07-11 DIAGNOSIS — L821 Other seborrheic keratosis: Secondary | ICD-10-CM | POA: Diagnosis not present

## 2018-07-11 DIAGNOSIS — L820 Inflamed seborrheic keratosis: Secondary | ICD-10-CM | POA: Diagnosis not present

## 2018-07-11 DIAGNOSIS — X32XXXD Exposure to sunlight, subsequent encounter: Secondary | ICD-10-CM | POA: Diagnosis not present

## 2018-07-13 DIAGNOSIS — I10 Essential (primary) hypertension: Secondary | ICD-10-CM | POA: Diagnosis not present

## 2018-07-13 DIAGNOSIS — E7800 Pure hypercholesterolemia, unspecified: Secondary | ICD-10-CM | POA: Diagnosis not present

## 2018-07-19 ENCOUNTER — Telehealth: Payer: Self-pay | Admitting: Cardiovascular Disease

## 2018-07-19 NOTE — Telephone Encounter (Signed)
Virtual Visit Pre-Appointment Phone Call  "(Name), I am calling you today to discuss your upcoming appointment. We are currently trying to limit exposure to the virus that causes COVID-19 by seeing patients at home rather than in the office."  1. "What is the BEST phone number to call the day of the visit?" - include this in appointment notes  2. Do you have or have access to (through a family member/friend) a smartphone with video capability that we can use for your visit?" a. If yes - list this number in appt notes as cell (if different from BEST phone #) and list the appointment type as a VIDEO visit in appointment notes b. If no - list the appointment type as a PHONE visit in appointment notes  3. Confirm consent - "In the setting of the current Covid19 crisis, you are scheduled for a (phone or video) visit with your provider on (date) at (time).  Just as we do with many in-office visits, in order for you to participate in this visit, we must obtain consent.  If you'd like, I can send this to your mychart (if signed up) or email for you to review.  Otherwise, I can obtain your verbal consent now.  All virtual visits are billed to your insurance company just like a normal visit would be.  By agreeing to a virtual visit, we'd like you to understand that the technology does not allow for your provider to perform an examination, and thus may limit your provider's ability to fully assess your condition. If your provider identifies any concerns that need to be evaluated in person, we will make arrangements to do so.  Finally, though the technology is pretty good, we cannot assure that it will always work on either your or our end, and in the setting of a video visit, we may have to convert it to a phone-only visit.  In either situation, we cannot ensure that we have a secure connection.  Are you willing to proceed?" STAFF: Did the patient verbally acknowledge consent to telehealth visit? Document  YES/NO here: yes  4. Advise patient to be prepared - "Two hours prior to your appointment, go ahead and check your blood pressure, pulse, oxygen saturation, and your weight (if you have the equipment to check those) and write them all down. When your visit starts, your provider will ask you for this information. If you have an Apple Watch or Kardia device, please plan to have heart rate information ready on the day of your appointment. Please have a pen and paper handy nearby the day of the visit as well."  5. Give patient instructions for MyChart download to smartphone OR [email or web address] as below if video visit (depending on what platform provider is using)  6. Inform patient they will receive a phone call 15 minutes prior to their appointment time (may be from unknown caller ID) so they should be prepared to answer    TELEPHONE CALL NOTE  Shane Osborne has been deemed a candidate for a follow-up tele-health visit to limit community exposure during the Covid-19 pandemic. I spoke with the patient via phone to ensure availability of phone/video source, confirm preferred email & phone number, and discuss instructions and expectations.  I reminded Shane Osborne to be prepared with any vital sign and/or heart rhythm information that could potentially be obtained via home monitoring, at the time of his visit. I reminded Shane Osborne to expect a phone call prior to  his visit.  Shane Osborne 07/19/2018 4:08 PM   INSTRUCTIONS FOR DOWNLOADING THE MYCHART APP TO SMARTPHONE  - The patient must first make sure to have activated MyChart and know their login information - If Apple, go to [employer] and type in MyChart in the search bar and download the app. If Android, ask patient to go to [employer] and type in [place] in the search bar and download the app. The app is free but as with any other app downloads, their phone may require them to verify saved payment information or Apple/Android  password.  - The patient will need to then log into the app with their MyChart username and password, and select [facility] as their healthcare provider to link the account. When it is time for your visit, go to the MyChart app, find appointments, and click Begin Video Visit. Be sure to Select Allow for your device to access the Microphone and Camera for your visit. You will then be connected, and your provider will be with you shortly.  **If they have any issues connecting, or need assistance please contact MyChart service desk [phone])**  **If using a computer, in order to ensure the best quality for their visit they will need to use either of the following Internet Browsers: [employer], or Google Chrome**  IF USING DOXIMITY or DOXY.ME - The patient will receive a link just prior to their visit by text.     FULL LENGTH CONSENT FOR TELE-HEALTH VISIT   I hereby voluntarily request, consent and authorize [facility] and its employed or contracted physicians, physician assistants, nurse practitioners or other licensed health care professionals (the Practitioner), to provide me with telemedicine health care services (the Services") as deemed necessary by the treating Practitioner. I acknowledge and consent to receive the Services by the Practitioner via telemedicine. I understand that the telemedicine visit will involve communicating with the Practitioner through live audiovisual communication technology and the disclosure of certain medical information by electronic transmission. I acknowledge that I have been given the opportunity to request an in-person assessment or other available alternative prior to the telemedicine visit and am voluntarily participating in the telemedicine visit.  I understand that I have the right to withhold or withdraw my consent to the use of telemedicine in the course of my care at any time, without affecting my right to future care or treatment,  and that the Practitioner or I may terminate the telemedicine visit at any time. I understand that I have the right to inspect all information obtained and/or recorded in the course of the telemedicine visit and may receive copies of available information for a reasonable fee.  I understand that some of the potential risks of receiving the Services via telemedicine include:   Delay or interruption in medical evaluation due to technological equipment failure or disruption;  Information transmitted may not be sufficient (e.g. poor resolution of images) to allow for appropriate medical decision making by the Practitioner; and/or   In rare instances, security protocols could fail, causing a breach of personal health information.  Furthermore, I acknowledge that it is my responsibility to provide information about my medical history, conditions and care that is complete and accurate to the best of my ability. I acknowledge that Practitioner's advice, recommendations, and/or decision may be based on factors not within their control, such as incomplete or inaccurate data provided by me or distortions of diagnostic images or specimens that may result from electronic transmissions. I  understand that the practice of medicine is not an exact science and that Practitioner makes no warranties or guarantees regarding treatment outcomes. I acknowledge that I will receive a copy of this consent concurrently upon execution via email to the email address I last provided but may also request a printed copy by calling the office of [facility].    I understand that my insurance will be billed for this visit.   I have read or had this consent read to me.  I understand the contents of this consent, which adequately explains the benefits and risks of the Services being provided via telemedicine.   I have been provided ample opportunity to ask questions regarding this consent and the Services and have had my questions  answered to my satisfaction.  I give my informed consent for the services to be provided through the use of telemedicine in my medical care  By participating in this telemedicine visit I agree to the above.

## 2018-07-28 ENCOUNTER — Encounter: Payer: Self-pay | Admitting: Cardiovascular Disease

## 2018-07-28 ENCOUNTER — Telehealth (INDEPENDENT_AMBULATORY_CARE_PROVIDER_SITE_OTHER): Payer: Medicare Other | Admitting: Cardiovascular Disease

## 2018-07-28 VITALS — BP 127/78 | HR 84 | Ht 69.0 in | Wt 220.0 lb

## 2018-07-28 DIAGNOSIS — I10 Essential (primary) hypertension: Secondary | ICD-10-CM | POA: Diagnosis not present

## 2018-07-28 DIAGNOSIS — I350 Nonrheumatic aortic (valve) stenosis: Secondary | ICD-10-CM | POA: Diagnosis not present

## 2018-07-28 DIAGNOSIS — R0609 Other forms of dyspnea: Secondary | ICD-10-CM

## 2018-07-28 MED ORDER — CARVEDILOL 3.125 MG PO TABS: 3.1250 mg | ORAL_TABLET | Freq: Two times a day (BID) | ORAL | 1 refills | Status: DC

## 2018-07-28 NOTE — Patient Instructions (Signed)
Your physician recommends that you schedule a follow-up appointment in: [place] has recommended you make the following change in your medication:   START COREG 3.125 MG [place] physician has requested that you have an echocardiogram. Echocardiography is a painless test that uses sound waves to create images of your heart. It provides your doctor with information about the size and shape of your heart and how well your heart's chambers and valves are working. This procedure takes approximately one hour. There are no restrictions for this procedure.  Thank you for choosing [facility]!!

## 2018-07-28 NOTE — Progress Notes (Signed)
Virtual Visit via Telephone Note   This visit type was conducted due to national recommendations for restrictions regarding the COVID-19 Pandemic (e.g. social distancing) in an effort to limit this patient's exposure and mitigate transmission in our community.  Due to his co-morbid illnesses, this patient is at least at moderate risk for complications without adequate follow up.  This format is felt to be most appropriate for this patient at this time.  The patient did not have access to video technology/had technical difficulties with video requiring transitioning to audio format only (telephone).  All issues noted in this document were discussed and addressed.  No physical exam could be performed with this format.  Please refer to the patient's chart for his  consent to telehealth for [facility].   Date:  07/28/2018   ID:  Shane Osborne, DOB 1940/01/31, MRN [id number]  Patient Location: Home Provider Location: Office  PCP:  Glenda Chroman, MD  Cardiologist:  Kate Sable, MD  Electrophysiologist:  None   Evaluation Performed:  Follow-Up Visit  Chief Complaint: Aortic stenosis  History of Present Illness:    Shane Osborne is a 78 y.o. male with past medical history of nonobstructive CAD by catheterization in 02/2016, mild to moderate AS, HTN, and HLD.   He had issues with hypertension in April 2020 and irbesartan was increased to 300 mg.  He says energy levels are somewhat low but have remained fairly stable.  He walks his dog and he gets short of breath more quickly than he used to.  He denies chest pain, orthopnea, paroxysmal nocturnal dyspnea.  The patient does not have symptoms concerning for COVID-19 infection (fever, chills, cough, or new shortness of breath).    Past Medical History:  Diagnosis Date  . GERD (gastroesophageal reflux disease)   . Hyperlipidemia   . Hypertension    Past Surgical History:  Procedure Laterality Date  . COLONOSCOPY  10/01/2010   Procedure: COLONOSCOPY;  Surgeon: Rogene Houston, MD;  [facility];  Service: Endoscopy;  Laterality: N/A;  . COLONOSCOPY N/A 09/01/2013   Procedure: COLONOSCOPY;  Surgeon: Rogene Houston, MD;  [facility];  Service: Endoscopy;  Laterality: N/A;  940  . ESOPHAGOGASTRODUODENOSCOPY    . ESOPHAGOGASTRODUODENOSCOPY N/A 04/11/2014   Procedure: ESOPHAGOGASTRODUODENOSCOPY (EGD);  Surgeon: Rogene Houston, MD;  [facility];  Service: Endoscopy;  Laterality: N/A;  1200 - moved to 12:55 - Ann to notify pt  . LEFT HEART CATH AND CORONARY ANGIOGRAPHY N/A 03/23/2016   Procedure: Left Heart Cath and Coronary Angiography;  Surgeon: Sherren Mocha, MD;  Location: [place] [facility];  Service: Cardiovascular;  Laterality: N/A;  . right leg  20 years ago   X 8 secondary to fracture  . rt inguinal      hernia repair 2 months ago     No outpatient medications have been marked as taking for the 07/28/18 encounter (Telemedicine) with Herminio Commons, MD.     Allergies:   Other, Sulfa antibiotics, and Vancomycin   Social History   Tobacco Use  . Smoking status: Never Smoker  . Smokeless tobacco: Never Used  Substance Use Topics  . Alcohol use: Yes    Alcohol/week: 5.0 standard drinks    Types: 5 Shots of liquor per week    Comment: 2 oz in the afternoon  . Drug use: No     Family Hx: The patient's family history includes Cancer in his mother; Heart attack  in his father.  ROS:   Please see the history of present illness.     All other systems reviewed and are negative.   Prior CV studies:   The following studies were reviewed today:  Cardiac Catheterization: 02/2016 1. Mild CAD with nonobstructive left main and RCA stenosis 2. Calcified coronary arteries without significant stenoses 3. Mildly elevated LVEDP  Echocardiogram: 10/2017 Study Conclusions  - Left ventricle: The cavity size was normal. Systolic function was normal. The estimated ejection  fraction was in the range of 60% to 65%. Wall motion was normal; there were no regional wall motion abnormalities. Doppler parameters are consistent with abnormal left ventricular relaxation (grade 1 diastolic dysfunction). - Aortic valve: Moderately calcified annulus. Trileaflet; mildly calcified leaflets. There was mild to moderate stenosis. Valve area (VTI): 1.09 cm^2. Valve area (Vmax): 1.12 cm^2. Valve area (Vmean): 1.21 cm^2. - Tricuspid valve: There was mild regurgitation.  Labs/Other Tests and Data Reviewed:    EKG: Sinus rhythm with frequent PVCs on 04/19/2017  Recent Labs: 06/06/2018: BUN 19; Creatinine, Ser 1.11; Potassium 3.8; Sodium 141   Recent Lipid Panel No results found for: CHOL, TRIG, HDL, CHOLHDL, LDLCALC, LDLDIRECT  Wt Readings from Last 3 Encounters:  07/28/18 220 lb (99.8 kg)  05/24/18 220 lb (99.8 kg)  04/19/17 222 lb (100.7 kg)     Objective:    Vital Signs:  BP 127/78   Pulse 84   Ht 5\' 9"  (1.753 m)   Wt 220 lb (99.8 kg)   BMI 32.49 kg/m    161/89 this morning, 127/78 last night  VITAL SIGNS:  reviewed  ASSESSMENT & PLAN:    1.  Aortic stenosis: Mild to moderate in severity by echocardiogram in October 2019. He has had more exertional dyspnea with routine activities.  I will obtain a follow-up echocardiogram to assess for interval changes in aortic stenosis severity.  2.  Hypertension: BP is intermittently elevated in the morning upon review of BP log. He takes Avapro 300 mg q am.  HR usually in high 80s to mid 90's.  He has also had more exertional dyspnea.  I will start carvedilol 3.125 mg twice daily.   COVID-19 Education: The signs and symptoms of COVID-19 were discussed with the patient and how to seek care for testing (follow up with PCP or arrange E-visit).  The importance of social distancing was discussed today.  Time:   Today, I have spent 25 minutes with the patient with telehealth technology discussing the above  problems.     Medication Adjustments/Labs and Tests Ordered: Current medicines are reviewed at length with the patient today.  Concerns regarding medicines are outlined above.   Tests Ordered: No orders of the defined types were placed in this encounter.   Medication Changes: No orders of the defined types were placed in this encounter.   Follow Up:  Virtual Visit or In Person in 3 month(s)  Signed, Kate Sable, MD  07/28/2018 9:15 AM    [place]

## 2018-07-28 NOTE — Addendum Note (Signed)
Addended by: Julian Hy T on: 07/28/2018 09:32 AM   Modules accepted: Orders

## 2018-08-16 DIAGNOSIS — E7800 Pure hypercholesterolemia, unspecified: Secondary | ICD-10-CM | POA: Diagnosis not present

## 2018-08-16 DIAGNOSIS — I10 Essential (primary) hypertension: Secondary | ICD-10-CM | POA: Diagnosis not present

## 2018-08-24 ENCOUNTER — Other Ambulatory Visit: Payer: Self-pay

## 2018-08-24 ENCOUNTER — Ambulatory Visit (INDEPENDENT_AMBULATORY_CARE_PROVIDER_SITE_OTHER): Payer: Medicare Other

## 2018-08-24 DIAGNOSIS — I350 Nonrheumatic aortic (valve) stenosis: Secondary | ICD-10-CM | POA: Diagnosis not present

## 2018-08-26 ENCOUNTER — Telehealth: Payer: Self-pay | Admitting: *Deleted

## 2018-08-26 NOTE — Telephone Encounter (Signed)
Pt voiced understanding - routed to pcp  

## 2018-08-26 NOTE — Telephone Encounter (Signed)
-----   Message from Herminio Commons, MD sent at 08/24/2018 12:44 PM EDT ----- Pumping function is normal. Aortic stenosis is in mild to moderate range. Stable results.

## 2018-09-05 ENCOUNTER — Encounter (INDEPENDENT_AMBULATORY_CARE_PROVIDER_SITE_OTHER): Payer: Self-pay | Admitting: *Deleted

## 2018-09-16 DIAGNOSIS — Z7189 Other specified counseling: Secondary | ICD-10-CM | POA: Diagnosis not present

## 2018-09-16 DIAGNOSIS — Z1331 Encounter for screening for depression: Secondary | ICD-10-CM | POA: Diagnosis not present

## 2018-09-16 DIAGNOSIS — L821 Other seborrheic keratosis: Secondary | ICD-10-CM | POA: Diagnosis not present

## 2018-09-16 DIAGNOSIS — Z6833 Body mass index (BMI) 33.0-33.9, adult: Secondary | ICD-10-CM | POA: Diagnosis not present

## 2018-09-16 DIAGNOSIS — Z1339 Encounter for screening examination for other mental health and behavioral disorders: Secondary | ICD-10-CM | POA: Diagnosis not present

## 2018-09-16 DIAGNOSIS — E7800 Pure hypercholesterolemia, unspecified: Secondary | ICD-10-CM | POA: Diagnosis not present

## 2018-09-16 DIAGNOSIS — Z0000 Encounter for general adult medical examination without abnormal findings: Secondary | ICD-10-CM | POA: Diagnosis not present

## 2018-09-16 DIAGNOSIS — Z1211 Encounter for screening for malignant neoplasm of colon: Secondary | ICD-10-CM | POA: Diagnosis not present

## 2018-09-16 DIAGNOSIS — Z125 Encounter for screening for malignant neoplasm of prostate: Secondary | ICD-10-CM | POA: Diagnosis not present

## 2018-09-16 DIAGNOSIS — Z79899 Other long term (current) drug therapy: Secondary | ICD-10-CM | POA: Diagnosis not present

## 2018-09-16 DIAGNOSIS — I10 Essential (primary) hypertension: Secondary | ICD-10-CM | POA: Diagnosis not present

## 2018-09-16 DIAGNOSIS — R5383 Other fatigue: Secondary | ICD-10-CM | POA: Diagnosis not present

## 2018-09-16 DIAGNOSIS — Z299 Encounter for prophylactic measures, unspecified: Secondary | ICD-10-CM | POA: Diagnosis not present

## 2018-09-23 DIAGNOSIS — N3943 Post-void dribbling: Secondary | ICD-10-CM | POA: Diagnosis not present

## 2018-09-23 DIAGNOSIS — R102 Pelvic and perineal pain: Secondary | ICD-10-CM | POA: Diagnosis not present

## 2018-09-27 DIAGNOSIS — E7800 Pure hypercholesterolemia, unspecified: Secondary | ICD-10-CM | POA: Diagnosis not present

## 2018-09-27 DIAGNOSIS — I10 Essential (primary) hypertension: Secondary | ICD-10-CM | POA: Diagnosis not present

## 2018-09-29 DIAGNOSIS — L718 Other rosacea: Secondary | ICD-10-CM | POA: Diagnosis not present

## 2018-10-05 DIAGNOSIS — Z23 Encounter for immunization: Secondary | ICD-10-CM | POA: Diagnosis not present

## 2018-10-21 DIAGNOSIS — I10 Essential (primary) hypertension: Secondary | ICD-10-CM | POA: Diagnosis not present

## 2018-10-31 ENCOUNTER — Other Ambulatory Visit: Payer: Self-pay

## 2018-10-31 ENCOUNTER — Encounter: Payer: Self-pay | Admitting: Cardiovascular Disease

## 2018-10-31 ENCOUNTER — Ambulatory Visit (INDEPENDENT_AMBULATORY_CARE_PROVIDER_SITE_OTHER): Payer: Medicare Other | Admitting: Cardiovascular Disease

## 2018-10-31 VITALS — BP 158/78 | HR 82 | Ht 69.0 in | Wt 225.0 lb

## 2018-10-31 DIAGNOSIS — I10 Essential (primary) hypertension: Secondary | ICD-10-CM

## 2018-10-31 DIAGNOSIS — R0600 Dyspnea, unspecified: Secondary | ICD-10-CM | POA: Diagnosis not present

## 2018-10-31 DIAGNOSIS — I340 Nonrheumatic mitral (valve) insufficiency: Secondary | ICD-10-CM | POA: Diagnosis not present

## 2018-10-31 DIAGNOSIS — I350 Nonrheumatic aortic (valve) stenosis: Secondary | ICD-10-CM

## 2018-10-31 DIAGNOSIS — R0609 Other forms of dyspnea: Secondary | ICD-10-CM

## 2018-10-31 MED ORDER — CARVEDILOL 6.25 MG PO TABS: 6.2500 mg | ORAL_TABLET | Freq: Two times a day (BID) | ORAL | 1 refills | Status: DC

## 2018-10-31 NOTE — Progress Notes (Signed)
SUBJECTIVE: Shane Osborne is a 78 y.o. male with past medical history of nonobstructive CAD by catheterization in 02/2016, mild to moderate AS, HTN, and HLD.   He had issues with hypertension in April 2020 and irbesartan was increased to 300 mg.  He developed shortness of breath when vacuuming or walking up hills or doing anything vigorous.  He denies shortness of breath when walking on level ground.  He denies exertional chest pain.  He denies palpitations.  His blood pressure has been elevated lately.  He is also had some left shoulder pain and does have rotator cuff issues.  ECG performed in the office today which I ordered and personally reviewed demonstrates sinus rhythm with sinus arrhythmia and PACs.  Review of Systems: As per "subjective", otherwise negative.  Allergies  Allergen Reactions  . Other     [place] nuts - break out in hive throat swelling   . Sulfa Antibiotics   . Vancomycin     Current Outpatient Medications  Medication Sig Dispense Refill  . carvedilol (COREG) 3.125 MG tablet Take 1 tablet (3.125 mg total) by mouth 2 (two) times daily. 180 tablet 1  . irbesartan (AVAPRO) 300 MG tablet Take 1 tablet (300 mg total) by mouth daily. 30 tablet 11  . Multiple Vitamin (MULTIVITAMIN) tablet Take 1 tablet by mouth daily.    . Omega-3 Fatty Acids (FISH OIL) 1200 MG CAPS Take 1 capsule by mouth 2 (two) times daily before a meal.     . potassium chloride SA (KLOR-CON M20) 20 MEQ tablet Take 1 tablet (20 mEq total) by mouth daily. 90 tablet 3  . simvastatin (ZOCOR) 20 MG tablet Take 20 mg by mouth at bedtime.      No current facility-administered medications for this visit.     Past Medical History:  Diagnosis Date  . GERD (gastroesophageal reflux disease)   . Hyperlipidemia   . Hypertension     Past Surgical History:  Procedure Laterality Date  . COLONOSCOPY  10/01/2010   Procedure: COLONOSCOPY;  Surgeon: Rogene Houston, MD;  [facility];   Service: Endoscopy;  Laterality: N/A;  . COLONOSCOPY N/A 09/01/2013   Procedure: COLONOSCOPY;  Surgeon: Rogene Houston, MD;  [facility];  Service: Endoscopy;  Laterality: N/A;  940  . ESOPHAGOGASTRODUODENOSCOPY    . ESOPHAGOGASTRODUODENOSCOPY N/A 04/11/2014   Procedure: ESOPHAGOGASTRODUODENOSCOPY (EGD);  Surgeon: Rogene Houston, MD;  [facility];  Service: Endoscopy;  Laterality: N/A;  1200 - moved to 12:55 - Ann to notify pt  . LEFT HEART CATH AND CORONARY ANGIOGRAPHY N/A 03/23/2016   Procedure: Left Heart Cath and Coronary Angiography;  Surgeon: Sherren Mocha, MD;  Location: [place] [facility];  Service: Cardiovascular;  Laterality: N/A;  . right leg  20 years ago   X 8 secondary to fracture  . rt inguinal      hernia repair 2 months ago    Social History   Socioeconomic History  . Marital status: Married    Spouse name: Not on file  . Number of children: Not on file  . Years of education: Not on file  . Highest education level: Not on file  Occupational History  . Not on file  Social Needs  . Financial resource strain: Not on file  . Food insecurity    Worry: Not on file    Inability: Not on file  . Transportation needs    Medical: Not on file  Non-medical: Not on file  Tobacco Use  . Smoking status: Never Smoker  . Smokeless tobacco: Never Used  Substance and Sexual Activity  . Alcohol use: Yes    Alcohol/week: 5.0 standard drinks    Types: 5 Shots of liquor per week    Comment: 2 oz in the afternoon  . Drug use: No  . Sexual activity: Not on file  Lifestyle  . Physical activity    Days per week: Not on file    Minutes per session: Not on file  . Stress: Not on file  Relationships  . Social Herbalist on phone: Not on file    Gets together: Not on file    Attends religious service: Not on file    Active member of club or organization: Not on file    Attends meetings of clubs or organizations: Not on file    Relationship  status: Not on file  . Intimate partner violence    Fear of current or ex partner: Not on file    Emotionally abused: Not on file    Physically abused: Not on file    Forced sexual activity: Not on file  Other Topics Concern  . Not on file  Social History Narrative  . Not on file     Vitals:   10/31/18 0857  BP: (!) 158/78  Pulse: 82  SpO2: 98%  Weight: 225 lb (102.1 kg)  Height: 5\' 9"  (1.753 m)    Wt Readings from Last 3 Encounters:  10/31/18 225 lb (102.1 kg)  07/28/18 220 lb (99.8 kg)  05/24/18 220 lb (99.8 kg)     PHYSICAL EXAM General: NAD HEENT: Normal. Neck: No JVD, no thyromegaly. Lungs: Clear to auscultation bilaterally with normal respiratory effort. CV: Regular rate and rhythm, normal S1/S2, no [id number], 3/6 systolic murmur heard throughout precordium. No pretibial or periankle edema.  No carotid bruit.   Abdomen: Soft, nontender, no distention.  Neurologic: Alert and oriented.  Psych: Normal affect. Skin: Normal. Musculoskeletal: No gross deformities.      Labs: Lab Results  Component Value Date/Time   K 3.8 06/06/2018 08:24 AM   BUN 19 06/06/2018 08:24 AM   CREATININE 1.11 06/06/2018 08:24 AM   HGB 14.9 03/20/2016 03:32 PM     Lipids: No results found for: LDLCALC, LDLDIRECT, CHOL, TRIG, HDL    Cardiac Catheterization: 02/2016 1. Mild CAD with nonobstructive left main and RCA stenosis 2. Calcified coronary arteries without significant stenoses 3. Mildly elevated LVEDP   Echocardiogram 08/24/2018:   1. The left ventricle has normal systolic function with an ejection fraction of 60-65%. The cavity size was normal. Left ventricular diastolic Doppler parameters are consistent with impaired relaxation. Elevated left ventricular end-diastolic pressure.  2. The right ventricle has normal systolic function. The cavity was normal. There is no increase in right ventricular wall thickness.  3. The aortic valve is tricuspid. Mild thickening of the  aortic valve. Mild calcification of the aortic valve. Mild-moderate stenosis (closer to moderate) of the aortic valve. Moderate aortic annular calcification noted.  4. The mitral valve is degenerative. Mild thickening of the mitral valve leaflet. There is mild mitral annular calcification present. Mitral valve regurgitation is moderate by color flow Doppler.  5. The tricuspid valve is grossly normal.  6. The aorta is normal in size and structure.  7. The interatrial septum was not well visualized.  ASSESSMENT AND PLAN:  1.  Aortic stenosis: Mild to moderate in severity but closer  to moderate by echocardiogram on 08/24/2018.  I will obtain a follow-up echocardiogram in 6 to 12 months.  2.  Hypertension: Blood pressure is elevated today.  I will increase carvedilol to 6.25 mg twice daily.  He takes Avapro 300 mg every morning.  3.  Mitral regurgitation: Moderate in severity by echocardiogram in 08/24/2018.  4.  Shortness of breath: He typically has exertional dyspnea when doing vigorous activities but not when walking on level ground.  He has valvular heart disease as detailed above.  I will aim to control blood pressure.  No adventitious lung sounds on physical exam and no history of smoking.  He does have a prior history of right hemi-diaphragmatic elevation seen most recently by chest x-ray on 03/20/2016.   Disposition: Follow up 6 months   Kate Sable, M.D., F.A.C.C.

## 2018-10-31 NOTE — Patient Instructions (Signed)
Medication Instructions:   Increase Coreg to 6.25mg  twice a day.    Continue all other current medications.  Labwork: none  Testing/Procedures: none  Follow-Up: Your physician wants you to follow up in: 6 months.  You will receive a reminder letter in the mail one-two months in advance.  If you don't receive a letter, please call our office to schedule the follow up appointment   Any Other Special Instructions Will Be Listed Below (If Applicable).  If you need a refill on your cardiac medications before your next appointment, please call your pharmacy.

## 2018-11-22 DIAGNOSIS — I10 Essential (primary) hypertension: Secondary | ICD-10-CM | POA: Diagnosis not present

## 2018-12-19 DIAGNOSIS — Z6833 Body mass index (BMI) 33.0-33.9, adult: Secondary | ICD-10-CM | POA: Diagnosis not present

## 2018-12-19 DIAGNOSIS — R1032 Left lower quadrant pain: Secondary | ICD-10-CM | POA: Diagnosis not present

## 2018-12-19 DIAGNOSIS — I10 Essential (primary) hypertension: Secondary | ICD-10-CM | POA: Diagnosis not present

## 2018-12-19 DIAGNOSIS — Z713 Dietary counseling and surveillance: Secondary | ICD-10-CM | POA: Diagnosis not present

## 2018-12-19 DIAGNOSIS — Z299 Encounter for prophylactic measures, unspecified: Secondary | ICD-10-CM | POA: Diagnosis not present

## 2019-02-06 DIAGNOSIS — I10 Essential (primary) hypertension: Secondary | ICD-10-CM | POA: Diagnosis not present

## 2019-02-06 DIAGNOSIS — E7800 Pure hypercholesterolemia, unspecified: Secondary | ICD-10-CM | POA: Diagnosis not present

## 2019-02-22 DIAGNOSIS — I10 Essential (primary) hypertension: Secondary | ICD-10-CM | POA: Diagnosis not present

## 2019-03-06 DIAGNOSIS — I10 Essential (primary) hypertension: Secondary | ICD-10-CM | POA: Diagnosis not present

## 2019-03-06 DIAGNOSIS — E7800 Pure hypercholesterolemia, unspecified: Secondary | ICD-10-CM | POA: Diagnosis not present

## 2019-03-21 DIAGNOSIS — I10 Essential (primary) hypertension: Secondary | ICD-10-CM | POA: Diagnosis not present

## 2019-03-21 DIAGNOSIS — Z713 Dietary counseling and surveillance: Secondary | ICD-10-CM | POA: Diagnosis not present

## 2019-03-21 DIAGNOSIS — B356 Tinea cruris: Secondary | ICD-10-CM | POA: Diagnosis not present

## 2019-03-21 DIAGNOSIS — Z299 Encounter for prophylactic measures, unspecified: Secondary | ICD-10-CM | POA: Diagnosis not present

## 2019-03-26 DIAGNOSIS — I10 Essential (primary) hypertension: Secondary | ICD-10-CM | POA: Diagnosis not present

## 2019-04-25 DIAGNOSIS — I10 Essential (primary) hypertension: Secondary | ICD-10-CM | POA: Diagnosis not present

## 2019-05-25 ENCOUNTER — Encounter: Payer: Self-pay | Admitting: *Deleted

## 2019-05-25 ENCOUNTER — Ambulatory Visit (INDEPENDENT_AMBULATORY_CARE_PROVIDER_SITE_OTHER): Payer: Medicare Other | Admitting: Cardiovascular Disease

## 2019-05-25 ENCOUNTER — Other Ambulatory Visit: Payer: Self-pay

## 2019-05-25 ENCOUNTER — Encounter: Payer: Self-pay | Admitting: Cardiovascular Disease

## 2019-05-25 ENCOUNTER — Ambulatory Visit (INDEPENDENT_AMBULATORY_CARE_PROVIDER_SITE_OTHER): Payer: Medicare Other

## 2019-05-25 VITALS — BP 156/78 | HR 82 | Ht 69.0 in | Wt 217.6 lb

## 2019-05-25 DIAGNOSIS — R0602 Shortness of breath: Secondary | ICD-10-CM | POA: Diagnosis not present

## 2019-05-25 DIAGNOSIS — I340 Nonrheumatic mitral (valve) insufficiency: Secondary | ICD-10-CM

## 2019-05-25 DIAGNOSIS — Z01812 Encounter for preprocedural laboratory examination: Secondary | ICD-10-CM

## 2019-05-25 DIAGNOSIS — I350 Nonrheumatic aortic (valve) stenosis: Secondary | ICD-10-CM

## 2019-05-25 DIAGNOSIS — I10 Essential (primary) hypertension: Secondary | ICD-10-CM

## 2019-05-25 MED ORDER — METOPROLOL TARTRATE 50 MG PO TABS: 50.0000 mg | ORAL_TABLET | Freq: Once | ORAL | 0 refills | Status: DC

## 2019-05-25 NOTE — Patient Instructions (Addendum)
Medication Instructions:  Continue all current medications.  Labwork:  BMET - order given today.  Please do 2-3 days prior to CT at [facility].  Testing/Procedures:  Your physician has requested that you have an echocardiogram. Echocardiography is a painless test that uses sound waves to create images of your heart. It provides your doctor with information about the size and shape of your heart and how well your heart's chambers and valves are working. This procedure takes approximately one hour. There are no restrictions for this procedure.  Your physician has requested that you have cardiac CT. Cardiac computed tomography (CT) is a painless test that uses an x-ray machine to take clear, detailed pictures of your heart. For further information please visit [facility]. Please follow instruction sheet as given.  Follow-Up:  Office will contact with results via phone or letter.    2 months   Any Other Special Instructions Will Be Listed Below (If Applicable).  If you need a refill on your cardiac medications before your next appointment, please call your pharmacy.

## 2019-05-25 NOTE — Progress Notes (Signed)
SUBJECTIVE: Shane Osborne is a 79 y.o. male with past medical history of nonobstructive CAD by catheterization in 02/2016, mild to moderate AS, HTN, and HLD.   His PCP recently made antihypertensive medication adjustments on 05/16/2019.  The blood pressure monitor is linked with his PCPs office.  I reviewed the blood pressure log which demonstrates well-controlled blood pressures in the last several days but it is elevated up to 156/78 in our office today.  He complains of increasing exertional dyspnea from when I last saw him in October 2020.  He said "I have no energy ".  ECG in October 2020 which I personally view demonstrates sinus rhythm with PACs and sinus arrhythmia.  Review of Systems: As per "subjective", otherwise negative.  Allergies  Allergen Reactions  . Other     [place] nuts - break out in hive throat swelling   . Sulfa Antibiotics   . Vancomycin     Current Outpatient Medications  Medication Sig Dispense Refill  . carvedilol (COREG) 12.5 MG tablet Take 18.75 mg by mouth 2 (two) times daily.    . irbesartan (AVAPRO) 300 MG tablet Take 1 tablet (300 mg total) by mouth daily. 30 tablet 11  . Multiple Vitamin (MULTIVITAMIN) tablet Take 1 tablet by mouth daily.    . Omega-3 Fatty Acids (FISH OIL) 1200 MG CAPS Take 1 capsule by mouth 2 (two) times daily before a meal.     . potassium chloride SA (KLOR-CON M20) 20 MEQ tablet Take 1 tablet (20 mEq total) by mouth daily. 90 tablet 3  . simvastatin (ZOCOR) 20 MG tablet Take 20 mg by mouth at bedtime.      No current facility-administered medications for this visit.    Past Medical History:  Diagnosis Date  . GERD (gastroesophageal reflux disease)   . Hyperlipidemia   . Hypertension     Past Surgical History:  Procedure Laterality Date  . COLONOSCOPY  10/01/2010   Procedure: COLONOSCOPY;  Surgeon: Rogene Houston, MD;  [facility];  Service: Endoscopy;  Laterality: N/A;  . COLONOSCOPY N/A 09/01/2013    Procedure: COLONOSCOPY;  Surgeon: Rogene Houston, MD;  [facility];  Service: Endoscopy;  Laterality: N/A;  940  . ESOPHAGOGASTRODUODENOSCOPY    . ESOPHAGOGASTRODUODENOSCOPY N/A 04/11/2014   Procedure: ESOPHAGOGASTRODUODENOSCOPY (EGD);  Surgeon: Rogene Houston, MD;  [facility];  Service: Endoscopy;  Laterality: N/A;  1200 - moved to 12:55 - Ann to notify pt  . LEFT HEART CATH AND CORONARY ANGIOGRAPHY N/A 03/23/2016   Procedure: Left Heart Cath and Coronary Angiography;  Surgeon: Sherren Mocha, MD;  Location: [place] [facility];  Service: Cardiovascular;  Laterality: N/A;  . right leg  20 years ago   X 8 secondary to fracture  . rt inguinal      hernia repair 2 months ago    Social History   Socioeconomic History  . Marital status: Married    Spouse name: Not on file  . Number of children: Not on file  . Years of education: Not on file  . Highest education level: Not on file  Occupational History  . Not on file  Tobacco Use  . Smoking status: Never Smoker  . Smokeless tobacco: Never Used  Substance and Sexual Activity  . Alcohol use: Yes    Alcohol/week: 5.0 standard drinks    Types: 5 Shots of liquor per week    Comment: 2 oz in the afternoon  .  Drug use: No  . Sexual activity: Not on file  Other Topics Concern  . Not on file  Social History Narrative  . Not on file   Social Determinants of Health   Financial Resource Strain:   . Difficulty of Paying Living Expenses:   Food Insecurity:   . Worried About Charity fundraiser in the Last Year:   . Arboriculturist in the Last Year:   Transportation Needs:   . Film/video editor (Medical):   Marland Kitchen Lack of Transportation (Non-Medical):   Physical Activity:   . Days of Exercise per Week:   . Minutes of Exercise per Session:   Stress:   . Feeling of Stress :   Social Connections:   . Frequency of Communication with Friends and Family:   . Frequency of Social Gatherings with Friends and  Family:   . Attends Religious Services:   . Active Member of Clubs or Organizations:   . Attends Archivist Meetings:   Marland Kitchen Marital Status:   Intimate Partner Violence:   . Fear of Current or Ex-Partner:   . Emotionally Abused:   Marland Kitchen Physically Abused:   . Sexually Abused:      Vitals:   05/25/19 1410  BP: (!) 156/78  Pulse: 82  SpO2: 96%  Weight: 217 lb 9.6 oz (98.7 kg)  Height: 5\' 9"  (1.753 m)    Wt Readings from Last 3 Encounters:  05/25/19 217 lb 9.6 oz (98.7 kg)  10/31/18 225 lb (102.1 kg)  07/28/18 220 lb (99.8 kg)     PHYSICAL EXAM General: NAD HEENT: Normal. Neck: No JVD, no thyromegaly. Lungs: Clear to auscultation bilaterally with normal respiratory effort. CV: Regular rate and irregular rhythm, normal S1/S2, no [id number], 3/6 systolic murmur heard throughout precordium. No pretibial or periankle edema.  No carotid bruit.   Abdomen: Soft, nontender, no distention.  Neurologic: Alert and oriented.  Psych: Normal affect. Skin: Normal. Musculoskeletal: No gross deformities.      Labs: Lab Results  Component Value Date/Time   K 3.8 06/06/2018 08:24 AM   BUN 19 06/06/2018 08:24 AM   CREATININE 1.11 06/06/2018 08:24 AM   HGB 14.9 03/20/2016 03:32 PM     Lipids: No results found for: LDLCALC, LDLDIRECT, CHOL, TRIG, HDL    Cardiac Catheterization: 02/2016 1. Mild CAD with nonobstructive left main and RCA stenosis 2. Calcified coronary arteries without significant stenoses 3. Mildly elevated LVEDP   Echocardiogram 08/24/2018:   1. The left ventricle has normal systolic function with an ejection fraction of 60-65%. The cavity size was normal. Left ventricular diastolic Doppler parameters are consistent with impaired relaxation. Elevated left ventricular end-diastolic pressure.  2. The right ventricle has normal systolic function. The cavity was normal. There is no increase in right ventricular wall thickness.  3. The aortic valve is tricuspid. Mild  thickening of the aortic valve. Mild calcification of the aortic valve. Mild-moderate stenosis (closer to moderate) of the aortic valve. Moderate aortic annular calcification noted.  4. The mitral valve is degenerative. Mild thickening of the mitral valve leaflet. There is mild mitral annular calcification present. Mitral valve regurgitation is moderate by color flow Doppler.  5. The tricuspid valve is grossly normal.  6. The aorta is normal in size and structure.  7. The interatrial septum was not well visualized.  ASSESSMENT AND PLAN:  1.  Aortic stenosis: Mild to moderate in severity but closer to moderate by echocardiogram on 08/24/2018.  I will obtain a  follow-up echocardiogram as he has had increasing exertional dyspnea..  2.  Hypertension: His PCP recently made antihypertensive medication adjustments on 05/16/2019.  The blood pressure monitor is linked with his PCPs office. I reviewed the blood pressure log which demonstrates well-controlled blood pressures in the last several days but it is elevated up to 156/78 in our office today..  Currently on carvedilol 18.75 mg twice daily.  He takes Avapro 300 mg every morning.  I will not make any adjustments today..  3.  Mitral regurgitation: Moderate in severity by echocardiogram in 08/24/2018.  Given increasing exertional dyspnea, I will obtain a follow-up echocardiogram.  4.  Shortness of breath: He has had increasing exertional dyspnea over the last several months.  He has valvular heart disease as detailed above and I am obtaining a follow-up echocardiogram. No adventitious lung sounds on physical exam and no history of smoking.  He does have a prior history of right hemi-diaphragmatic elevation seen most recently by chest x-ray on 03/20/2016. I will obtain coronary CT angiography to see if he has developed obstructive coronary artery disease since last cardiac catheterization in February 2018.   Disposition: Follow up 6 months   Kate Sable, M.D., F.A.C.C.

## 2019-05-26 ENCOUNTER — Ambulatory Visit: Payer: Medicare Other | Admitting: Cardiovascular Disease

## 2019-05-26 DIAGNOSIS — I10 Essential (primary) hypertension: Secondary | ICD-10-CM | POA: Diagnosis not present

## 2019-05-26 LAB — ECHOCARDIOGRAM COMPLETE
Height: 69 in
Weight: 3481.6 oz

## 2019-05-29 ENCOUNTER — Other Ambulatory Visit: Payer: Self-pay | Admitting: Student

## 2019-06-01 ENCOUNTER — Telehealth: Payer: Self-pay | Admitting: *Deleted

## 2019-06-01 NOTE — Telephone Encounter (Signed)
Shane Osborne, [place]  [id number] PM EDT    Patient notified. Copy to pcp.

## 2019-06-01 NOTE — Telephone Encounter (Signed)
-----   Message from Herminio Commons, MD sent at 05/26/2019 10:56 AM EDT ----- Normal pumping function with moderate aortic valve narrowing.  I will continue to monitor.

## 2019-06-07 ENCOUNTER — Other Ambulatory Visit (HOSPITAL_COMMUNITY)
Admission: RE | Admit: 2019-06-07 | Discharge: 2019-06-07 | Disposition: A | Discharge status: Home / Self Care | Payer: Medicare Other | Source: Ambulatory Visit | Attending: Cardiovascular Disease | Admitting: Cardiovascular Disease

## 2019-06-07 ENCOUNTER — Telehealth (HOSPITAL_COMMUNITY): Payer: Self-pay | Admitting: Emergency Medicine

## 2019-06-07 DIAGNOSIS — Z01812 Encounter for preprocedural laboratory examination: Secondary | ICD-10-CM | POA: Diagnosis not present

## 2019-06-07 DIAGNOSIS — R0602 Shortness of breath: Secondary | ICD-10-CM | POA: Insufficient documentation

## 2019-06-07 LAB — BASIC METABOLIC PANEL
Anion gap: 9 (ref 5–15)
BUN: 16 mg/dL (ref 8–23)
CO2: 26 mmol/L (ref 22–32)
Calcium: 9.1 mg/dL (ref 8.9–10.3)
Chloride: 104 mmol/L (ref 98–111)
Creatinine, Ser: 1.06 mg/dL (ref 0.61–1.24)
GFR calc Af Amer: >60 mL/min (ref >60)
GFR calc non Af Amer: >60 mL/min (ref >60)
Glucose, Bld: 114 mg/dL — ABNORMAL HIGH (ref 70–99)
Potassium: 4.3 mmol/L (ref 3.5–5.1)
Sodium: 139 mmol/L (ref 135–145)

## 2019-06-07 NOTE — Telephone Encounter (Signed)
Reaching out to patient to offer assistance regarding upcoming cardiac imaging study; pt verbalizes understanding of appt date/time, parking situation and where to check in, pre-test NPO status and medications ordered, and verified current allergies; name and call back number provided for further questions should they arise Shane Bond RN Navigator Cardiac [facility] [place] [facility] [phone] office [phone] cell   Pt encouraged to take daily meds + metoprolol tartrate 2 hr prior to scan. Also instructed to withhold stimulants (ie coffee, antihistamines, chocolate, inhalers) for 12 hr prior to scan.  I also requested patient to have pre-test labs drawn today if available.- pt will try Pt verbalized understanding. Appreciated the call. Shane Osborne

## 2019-06-08 ENCOUNTER — Ambulatory Visit
Admission: RE | Admit: 2019-06-08 | Discharge: 2019-06-08 | Disposition: A | Discharge status: Home / Self Care | Payer: Medicare Other | Source: Ambulatory Visit | Attending: Cardiovascular Disease | Admitting: Cardiovascular Disease

## 2019-06-08 ENCOUNTER — Other Ambulatory Visit: Payer: Self-pay

## 2019-06-08 ENCOUNTER — Other Ambulatory Visit: Payer: Self-pay | Admitting: Cardiovascular Disease

## 2019-06-08 DIAGNOSIS — R918 Other nonspecific abnormal finding of lung field: Secondary | ICD-10-CM | POA: Insufficient documentation

## 2019-06-08 DIAGNOSIS — R0602 Shortness of breath: Secondary | ICD-10-CM | POA: Diagnosis not present

## 2019-06-08 DIAGNOSIS — I700 Atherosclerosis of aorta: Secondary | ICD-10-CM | POA: Insufficient documentation

## 2019-06-08 DIAGNOSIS — Z9189 Other specified personal risk factors, not elsewhere classified: Secondary | ICD-10-CM | POA: Diagnosis not present

## 2019-06-08 MED ORDER — IOHEXOL 350 MG/ML SOLN
100.0000 mL | Freq: Once | INTRAVENOUS | Status: AC | PRN
  Administered 2019-06-08: 100 mL via INTRAVENOUS

## 2019-06-08 MED ORDER — DILTIAZEM HCL 25 MG/5ML IV SOLN
10.0000 mg | Freq: Once | INTRAVENOUS | Status: AC
  Administered 2019-06-08: 10 mg via INTRAVENOUS

## 2019-06-08 MED ORDER — NITROGLYCERIN 0.4 MG SL SUBL
0.8000 mg | SUBLINGUAL_TABLET | Freq: Once | SUBLINGUAL | Status: AC
  Administered 2019-06-08: 0.8 mg via SUBLINGUAL

## 2019-06-08 MED ORDER — METOPROLOL TARTRATE 5 MG/5ML IV SOLN
10.0000 mg | Freq: Once | INTRAVENOUS | Status: AC
  Administered 2019-06-08: 10 mg via INTRAVENOUS

## 2019-06-08 NOTE — Progress Notes (Signed)
Patient unable to have CT due to heart rate after medication administration. Spoke with Doctor Garen Lah patient needs to be rescheduled at [place] [facility] to notify scheduler and nurse navigator. Patient drank water after. Ambulated to exit steady gait.

## 2019-06-09 ENCOUNTER — Telehealth: Payer: Self-pay | Admitting: Cardiovascular Disease

## 2019-06-09 DIAGNOSIS — J849 Interstitial pulmonary disease, unspecified: Secondary | ICD-10-CM

## 2019-06-09 DIAGNOSIS — R0602 Shortness of breath: Secondary | ICD-10-CM

## 2019-06-09 NOTE — Telephone Encounter (Signed)
Patient could not do test due to heart rate per patient. Was told to check with our office to make sure that we knew and that they would be rescheduling the test for him

## 2019-06-09 NOTE — Telephone Encounter (Signed)
I would get a Lexiscan.  I would also make a pulmonary referral for possible mild interstitial lung disease.

## 2019-06-09 NOTE — Telephone Encounter (Signed)
Looks like they did cardiac scoring - are they completing CT ? OR does he need to have any other test scheduled?

## 2019-06-09 NOTE — Telephone Encounter (Signed)
Pt went for cardiac CT yesterday and could not perform test says HR was not regulated and they would not do test - said they would arrange for pt to have test done at [facility] - will forward as [facility]

## 2019-06-13 NOTE — Telephone Encounter (Signed)
Left message to return call 

## 2019-06-13 NOTE — Telephone Encounter (Signed)
CORONARY CT -   Herminio Commons, MD  06/09/2019 3:21 PM EDT    Switch simvastatin to rosuvastatin 20 mg daily. I have already discussed obtaining a Lexiscan Myov[place].

## 2019-06-15 ENCOUNTER — Telehealth: Payer: Self-pay | Admitting: Cardiovascular Disease

## 2019-06-15 MED ORDER — ROSUVASTATIN CALCIUM 20 MG PO TABS
20.0000 mg | ORAL_TABLET | Freq: Every day | ORAL | 6 refills | Status: DC
Start: 2019-06-15 — End: 2020-05-31

## 2019-06-15 NOTE — Telephone Encounter (Signed)
Patient trying to return call.  Has to leave to go to doctor appt at 3pm.   He stated that you could leave the message with his wife if he is not home

## 2019-06-15 NOTE — Telephone Encounter (Signed)
Addressed in previous phone note.  

## 2019-06-15 NOTE — Telephone Encounter (Cosign Needed Addendum)
Shane Osborne, [place]  075-GRM 075-GRM PM EDT    Patient notified & agreed to [facility] & pulmonary referral.  Will enter order and send to pcc for scheduling. New medication sent to [facility].

## 2019-06-15 NOTE — Addendum Note (Signed)
Addended by: Laurine Blazer on: 06/15/2019 06:01 PM   Modules accepted: Orders

## 2019-06-16 ENCOUNTER — Telehealth: Payer: Self-pay | Admitting: Cardiovascular Disease

## 2019-06-16 NOTE — Telephone Encounter (Signed)
  Precert needed for: Lexiscan   Location:  [facility]  Date: July 07, 2019

## 2019-06-19 ENCOUNTER — Encounter: Payer: Self-pay | Admitting: *Deleted

## 2019-06-25 DIAGNOSIS — I10 Essential (primary) hypertension: Secondary | ICD-10-CM | POA: Diagnosis not present

## 2019-06-28 ENCOUNTER — Telehealth: Payer: Self-pay

## 2019-06-28 DIAGNOSIS — I259 Chronic ischemic heart disease, unspecified: Secondary | ICD-10-CM

## 2019-06-28 NOTE — Telephone Encounter (Signed)
-----   Message ----- From: Howie Ill Sent: 06/09/2019   8:47 AM EDT To: Herminio Commons, MD Subject: Cardiac ct                                     Dr Bronson Ing   Can I please get you to put in a new order for this patient to have cardiac ct, they tried to scan him yesterday and could not get his heart rate low enough.   The tech did the cardiac score on this patient but we need a new order for the cardiac morph  Sorry for any inconvenience. Jannet Askew       Order place for [facility]

## 2019-07-03 DIAGNOSIS — L304 Erythema intertrigo: Secondary | ICD-10-CM | POA: Diagnosis not present

## 2019-07-03 DIAGNOSIS — L821 Other seborrheic keratosis: Secondary | ICD-10-CM | POA: Diagnosis not present

## 2019-07-05 ENCOUNTER — Telehealth (INDEPENDENT_AMBULATORY_CARE_PROVIDER_SITE_OTHER): Payer: Self-pay | Admitting: *Deleted

## 2019-07-05 ENCOUNTER — Encounter (INDEPENDENT_AMBULATORY_CARE_PROVIDER_SITE_OTHER): Payer: Self-pay | Admitting: *Deleted

## 2019-07-05 ENCOUNTER — Ambulatory Visit (INDEPENDENT_AMBULATORY_CARE_PROVIDER_SITE_OTHER): Payer: Medicare Other | Admitting: Gastroenterology

## 2019-07-05 ENCOUNTER — Encounter (INDEPENDENT_AMBULATORY_CARE_PROVIDER_SITE_OTHER): Payer: Self-pay | Admitting: Gastroenterology

## 2019-07-05 ENCOUNTER — Other Ambulatory Visit: Payer: Self-pay

## 2019-07-05 VITALS — BP 160/91 | HR 73 | Temp 97.0°F | Ht 69.5 in | Wt 217.0 lb

## 2019-07-05 DIAGNOSIS — Z86010 Personal history of colonic polyps: Secondary | ICD-10-CM | POA: Diagnosis not present

## 2019-07-05 DIAGNOSIS — R110 Nausea: Secondary | ICD-10-CM

## 2019-07-05 MED ORDER — SUTAB 1479-225-188 MG PO TABS
1.0000 | ORAL_TABLET | Freq: Once | ORAL | 0 refills | Status: AC
Start: 2019-07-05 — End: 2019-07-05

## 2019-07-05 NOTE — Telephone Encounter (Signed)
Patient needs S[place] (copay card) ° °

## 2019-07-05 NOTE — Progress Notes (Signed)
Patient profile: Shane Osborne is a 78 y.o. male seen for evaluation of abd pain, last seen in clinic 02/2014.    History of Present Illness: Shane Osborne is seen today for chronic nausea, most common in morning, wakes up w/ mild nausea, states this has been ongoing x 3 years, he started a probiotic x 2 months and this actually significantly reduced his nausea. Denies gerd symptoms w/ the nausea. No vomiting. Food did not worsen the nausea. No dysphagia. Doesn't feel nightly alcohol worsens his chronic nausea.  He reports regularly having a BM daily routinely but more recently having 2-3 BM a day, sometimes has incomplete emptying, ongoing about 6 months. Has some urgency after certain foods, particularly ceral w/ milk. No blood in stool noted. Denies med changes when mild bowel habit changes started.    Pt is trying to loose weight via portion control - down #8lbs since last fall. Hopes to loose more.   Non smoker. Alcohol daily-4oz bourbon in afternoon and occasional beer. No nsaids.   Wt Readings from Last 3 Encounters:  07/05/19 217 lb (98.4 kg)  05/25/19 217 lb 9.6 oz (98.7 kg)  10/31/18 225 lb (102.1 kg)     Last Colonoscopy:  2015-Impression:  Examination performed to cecum. Three small polyps removed via cold  biopsy and submitted together(cecum, hepatic flexure and sigmoid colon). Two small diverticula noted(hepatic and splenic flexure). Submucosal lipoma at descending colon stable since last exam. Small external hemorrhoids.  Patient had three small polyps removed and only one is tubular adenoma. Results given to patient Next colonoscopy in 5 years  Last Endoscopy:  2016-Impression: Single small erosion at distal esophagus/GE junction. Nonerosive antral gastritis. Antral biopsy taken for CLO test. No evidence of peptic ulcer disease.   Past Medical History:  Past Medical History:  Diagnosis Date  . GERD (gastroesophageal reflux disease)   . Hyperlipidemia   .  Hypertension     Problem List: Patient Active Problem List   Diagnosis Date Noted  . Atrial fibrillation ([place]) 04/19/2017  . Upper airway cough syndrome 08/19/2016  . Dyspnea on exertion 05/19/2016  . Morbid obesity due to excess calories ([place]) 05/19/2016  . Abnormal nuclear cardiac imaging test 03/23/2016  . Abnormal nuclear stress test   . Heme positive stool 08/22/2013  . HYPERLIPIDEMIA 02/27/2009  . Essential hypertension 02/27/2009  . CHEST PAIN UNSPECIFIED 02/27/2009    Past Surgical History: Past Surgical History:  Procedure Laterality Date  . COLONOSCOPY  10/01/2010   Procedure: COLONOSCOPY;  Surgeon: Rogene Houston, MD;  [facility];  Service: Endoscopy;  Laterality: N/A;  . COLONOSCOPY N/A 09/01/2013   Procedure: COLONOSCOPY;  Surgeon: Rogene Houston, MD;  Location: [facility];  Service: Endoscopy;  Laterality: N/A;  940  . ESOPHAGOGASTRODUODENOSCOPY    . ESOPHAGOGASTRODUODENOSCOPY N/A 04/11/2014   Procedure: ESOPHAGOGASTRODUODENOSCOPY (EGD);  Surgeon: Rogene Houston, MD;  Location: [facility];  Service: Endoscopy;  Laterality: N/A;  1200 - moved to 12:55 - Ann to notify pt  . LEFT HEART CATH AND CORONARY ANGIOGRAPHY N/A 03/23/2016   Procedure: Left Heart Cath and Coronary Angiography;  Surgeon: Sherren Mocha, MD;  Location: [place] [facility];  Service: Cardiovascular;  Laterality: N/A;  . right leg  20 years ago   X 8 secondary to fracture  . rt inguinal      hernia repair 2 months ago    Allergies: Allergies  Allergen Reactions  . Other  Brazilian nuts - break out in hive throat swelling   . Sulfa Antibiotics   . Vancomycin       Home Medications:  Current Outpatient Medications:  .  carvedilol (COREG) 12.5 MG tablet, Take 18.75 mg by mouth 2 (two) times daily., Disp: , Rfl:  .  irbesartan (AVAPRO) 300 MG tablet, TAKE 1 TABLET BY MOUTH EVERY DAY, Disp: 30 tablet, Rfl: 6 .  Multiple Vitamin (MULTIVITAMIN) tablet, Take 1 tablet by mouth  daily., Disp: , Rfl:  .  Omega-3 Fatty Acids (FISH OIL) 1200 MG CAPS, Take 1 capsule by mouth 2 (two) times daily before a meal. , Disp: , Rfl:  .  potassium chloride SA (KLOR-CON M20) 20 MEQ tablet, Take 1 tablet (20 mEq total) by mouth daily., Disp: 90 tablet, Rfl: 3 .  Probiotic Product (PROBIOTIC PO), Take by mouth daily., Disp: , Rfl:  .  rosuvastatin (CRESTOR) 20 MG tablet, Take 1 tablet (20 mg total) by mouth daily., Disp: 30 tablet, Rfl: 6   Family History: family history includes Cancer in his mother; Heart attack in his father.  Denies fam hx of colon cancer. Reports mom w/ pancreatic cancer, died at age 8.   Social History:   reports that he has never smoked. He has never used smokeless tobacco. He reports current alcohol use of about 5.0 standard drinks of alcohol per week. He reports that he does not use drugs.   Review of Systems: Constitutional: Denies weight loss/weight gain  Eyes: No changes in vision. ENT: No oral lesions, sore throat.  GI: see HPI.  Heme/Lymph: No easy bruising.  CV: No chest pain.  GU: No hematuria.  Integumentary: No rashes.  Neuro: No headaches.  Psych: No depression/anxiety.  Endocrine: No heat/cold intolerance.  Allergic/Immunologic: No urticaria.  Resp: No cough, SOB.  Musculoskeletal: No joint swelling.    Physical Examination: BP (!) 160/91 (BP Location: Right Arm, Patient Position: Sitting, Cuff Size: Large)   Pulse 73   Temp (!) 97 F (36.1 C) (Temporal)   Ht 5' 9.5" (1.765 m)   Wt 217 lb (98.4 kg)   BMI 31.59 kg/m  Gen: NAD, alert and oriented x 4 HEENT: PEERLA, EOMI, Neck: supple, no JVD Chest: CTA bilaterally, no wheezes, crackles, or other adventitious sounds CV: RRR, no m/g/c/r Abd: soft, NT, ND, +BS in all four quadrants; no HSM, guarding, ridigity, or rebound tenderness Ext: no edema, well perfused with 2+ pulses, Skin: no rash or lesions noted on observed skin Lymph: no noted LAD  Data Reviewed:  05/2019-BMP  normal.    Assessment/Plan: Mr. Shane Osborne is a 79 y.o. male seen for nausea and hx polyps   1. History of colon polyps-last colonoscopy 2015, overdue for 5-year follow-up which will be arranged today. Has a mild change in bowel habits recently which I suspect may have a dietary component, he will look for food triggers.   2. Nausea-seem to drastically improve with probiotic which he will continue. Also discussed decreasing alcohol intake. He has lab scheduled with his primary care next week and will have PCP send [place] a copy. Given symptoms will add on endoscopy to schedule colonoscopy. He does not have any dysphagia.  Has stress test planned on 07/07/19. Patient notify me if abnormal-would need eval prior to sedated procedures.   Patient denies CP, SOB, and use of blood thinners. I discussed the risks and benefits of procedure including bleeding, perforation, infection, missed lesions, medication reactions and possible hospitalization or surgery if complications. All  questions answered.    Trini was seen today for new patient (initial visit).  Diagnoses and all orders for this visit:  Personal history of colonic polyps  Nausea without vomiting       I personally performed the service, non-incident to. (WP)  Laurine Blazer, [facility]

## 2019-07-05 NOTE — Patient Instructions (Addendum)
We are scheduling colonoscopy nausea for evaluation.  Please remind Dr. Woody Seller symptoms labs next week.  You can try and look for food triggers and looser stools.

## 2019-07-06 ENCOUNTER — Other Ambulatory Visit (INDEPENDENT_AMBULATORY_CARE_PROVIDER_SITE_OTHER): Payer: Self-pay | Admitting: *Deleted

## 2019-07-07 ENCOUNTER — Encounter (HOSPITAL_BASED_OUTPATIENT_CLINIC_OR_DEPARTMENT_OTHER)
Admission: RE | Admit: 2019-07-07 | Discharge: 2019-07-07 | Disposition: A | Discharge status: Home / Self Care | Payer: Medicare Other | Source: Ambulatory Visit | Attending: Cardiovascular Disease | Admitting: Cardiovascular Disease

## 2019-07-07 ENCOUNTER — Encounter (HOSPITAL_COMMUNITY): Payer: Self-pay

## 2019-07-07 ENCOUNTER — Other Ambulatory Visit: Payer: Self-pay

## 2019-07-07 ENCOUNTER — Encounter (HOSPITAL_COMMUNITY)
Admission: RE | Admit: 2019-07-07 | Discharge: 2019-07-07 | Disposition: A | Discharge status: Home / Self Care | Payer: Medicare Other | Source: Ambulatory Visit | Attending: Cardiovascular Disease | Admitting: Cardiovascular Disease

## 2019-07-07 DIAGNOSIS — R0602 Shortness of breath: Secondary | ICD-10-CM | POA: Insufficient documentation

## 2019-07-07 HISTORY — DX: Unspecified asthma, uncomplicated: J45.909

## 2019-07-07 LAB — NM MYOCAR MULTI W/SPECT W/WALL MOTION / EF
LV dias vol: 91 mL (ref 62–150)
LV sys vol: 37 mL
Peak HR: 96 {beats}/min
RATE: 0.33
Rest HR: 57 {beats}/min
SDS: 0
SRS: 2
SSS: 2
TID: 0.94

## 2019-07-07 MED ORDER — SODIUM CHLORIDE FLUSH 0.9 % IV SOLN
INTRAVENOUS | Status: AC
  Administered 2019-07-07: 10 mL via INTRAVENOUS
  Filled 2019-07-07: qty 10

## 2019-07-07 MED ORDER — TECHNETIUM TC 99M TETROFOSMIN IV KIT
30.0000 | PACK | Freq: Once | INTRAVENOUS | Status: AC | PRN
  Administered 2019-07-07: 30.8 via INTRAVENOUS

## 2019-07-07 MED ORDER — REGADENOSON 0.4 MG/5ML IV SOLN
INTRAVENOUS | Status: AC
  Administered 2019-07-07: 0.4 mg via INTRAVENOUS
  Filled 2019-07-07: qty 5

## 2019-07-07 MED ORDER — TECHNETIUM TC 99M TETROFOSMIN IV KIT
10.0000 | PACK | Freq: Once | INTRAVENOUS | Status: AC | PRN
  Administered 2019-07-07: 9.8 via INTRAVENOUS

## 2019-07-12 DIAGNOSIS — Z7189 Other specified counseling: Secondary | ICD-10-CM | POA: Diagnosis not present

## 2019-07-12 DIAGNOSIS — E7800 Pure hypercholesterolemia, unspecified: Secondary | ICD-10-CM | POA: Diagnosis not present

## 2019-07-12 DIAGNOSIS — R5383 Other fatigue: Secondary | ICD-10-CM | POA: Diagnosis not present

## 2019-07-12 DIAGNOSIS — Z0000 Encounter for general adult medical examination without abnormal findings: Secondary | ICD-10-CM | POA: Diagnosis not present

## 2019-07-12 DIAGNOSIS — I10 Essential (primary) hypertension: Secondary | ICD-10-CM | POA: Diagnosis not present

## 2019-07-12 DIAGNOSIS — Z299 Encounter for prophylactic measures, unspecified: Secondary | ICD-10-CM | POA: Diagnosis not present

## 2019-07-12 DIAGNOSIS — Z79899 Other long term (current) drug therapy: Secondary | ICD-10-CM | POA: Diagnosis not present

## 2019-07-14 ENCOUNTER — Telehealth: Payer: Self-pay | Admitting: *Deleted

## 2019-07-14 NOTE — Telephone Encounter (Signed)
Laurine Blazer, LPN  01/29/1592 5:85 AM EDT Back to Top    Notified, copy to pcp.

## 2019-07-14 NOTE — Telephone Encounter (Signed)
-----   Message from Arnoldo Lenis, MD sent at 07/12/2019  9:37 AM EDT ----- Negative stress test, no signs of any blocakges in the heart. Keep regular f/u with Dr Glendora Score MD

## 2019-07-25 ENCOUNTER — Other Ambulatory Visit (HOSPITAL_COMMUNITY)
Admission: RE | Admit: 2019-07-25 | Discharge: 2019-07-25 | Disposition: A | Discharge status: Home / Self Care | Payer: Medicare Other | Source: Ambulatory Visit | Attending: Internal Medicine | Admitting: Internal Medicine

## 2019-07-25 ENCOUNTER — Other Ambulatory Visit: Payer: Self-pay

## 2019-07-25 DIAGNOSIS — Z01812 Encounter for preprocedural laboratory examination: Secondary | ICD-10-CM | POA: Diagnosis not present

## 2019-07-25 DIAGNOSIS — Z20822 Contact with and (suspected) exposure to covid-19: Secondary | ICD-10-CM | POA: Diagnosis not present

## 2019-07-26 DIAGNOSIS — I10 Essential (primary) hypertension: Secondary | ICD-10-CM | POA: Diagnosis not present

## 2019-07-26 LAB — SARS CORONAVIRUS 2 (TAT 6-24 HRS): SARS Coronavirus 2: NEGATIVE

## 2019-07-27 ENCOUNTER — Encounter (HOSPITAL_COMMUNITY)
Admission: RE | Disposition: A | Discharge status: Home / Self Care | Payer: Self-pay | Source: Ambulatory Visit | Attending: Internal Medicine

## 2019-07-27 ENCOUNTER — Ambulatory Visit (HOSPITAL_COMMUNITY)
Admission: RE | Admit: 2019-07-27 | Discharge: 2019-07-27 | Disposition: A | Discharge status: Home / Self Care | Payer: Medicare Other | Source: Ambulatory Visit | Attending: Internal Medicine | Admitting: Internal Medicine

## 2019-07-27 ENCOUNTER — Encounter (HOSPITAL_COMMUNITY): Payer: Self-pay | Admitting: Internal Medicine

## 2019-07-27 DIAGNOSIS — Z8249 Family history of ischemic heart disease and other diseases of the circulatory system: Secondary | ICD-10-CM | POA: Insufficient documentation

## 2019-07-27 DIAGNOSIS — Z79899 Other long term (current) drug therapy: Secondary | ICD-10-CM | POA: Diagnosis not present

## 2019-07-27 DIAGNOSIS — Z09 Encounter for follow-up examination after completed treatment for conditions other than malignant neoplasm: Secondary | ICD-10-CM | POA: Diagnosis not present

## 2019-07-27 DIAGNOSIS — D123 Benign neoplasm of transverse colon: Secondary | ICD-10-CM | POA: Diagnosis not present

## 2019-07-27 DIAGNOSIS — K219 Gastro-esophageal reflux disease without esophagitis: Secondary | ICD-10-CM | POA: Insufficient documentation

## 2019-07-27 DIAGNOSIS — Z91018 Allergy to other foods: Secondary | ICD-10-CM | POA: Insufficient documentation

## 2019-07-27 DIAGNOSIS — Z860100 Personal history of colon polyps, unspecified: Secondary | ICD-10-CM

## 2019-07-27 DIAGNOSIS — K5730 Diverticulosis of large intestine without perforation or abscess without bleeding: Secondary | ICD-10-CM | POA: Diagnosis not present

## 2019-07-27 DIAGNOSIS — K648 Other hemorrhoids: Secondary | ICD-10-CM | POA: Diagnosis not present

## 2019-07-27 DIAGNOSIS — K3189 Other diseases of stomach and duodenum: Secondary | ICD-10-CM | POA: Diagnosis not present

## 2019-07-27 DIAGNOSIS — Z1211 Encounter for screening for malignant neoplasm of colon: Secondary | ICD-10-CM | POA: Diagnosis not present

## 2019-07-27 DIAGNOSIS — K259 Gastric ulcer, unspecified as acute or chronic, without hemorrhage or perforation: Secondary | ICD-10-CM | POA: Insufficient documentation

## 2019-07-27 DIAGNOSIS — K2970 Gastritis, unspecified, without bleeding: Secondary | ICD-10-CM | POA: Diagnosis not present

## 2019-07-27 DIAGNOSIS — R110 Nausea: Secondary | ICD-10-CM | POA: Diagnosis not present

## 2019-07-27 DIAGNOSIS — I10 Essential (primary) hypertension: Secondary | ICD-10-CM | POA: Insufficient documentation

## 2019-07-27 DIAGNOSIS — E785 Hyperlipidemia, unspecified: Secondary | ICD-10-CM | POA: Diagnosis not present

## 2019-07-27 DIAGNOSIS — Z881 Allergy status to other antibiotic agents status: Secondary | ICD-10-CM | POA: Diagnosis not present

## 2019-07-27 DIAGNOSIS — Z809 Family history of malignant neoplasm, unspecified: Secondary | ICD-10-CM | POA: Diagnosis not present

## 2019-07-27 DIAGNOSIS — D175 Benign lipomatous neoplasm of intra-abdominal organs: Secondary | ICD-10-CM | POA: Insufficient documentation

## 2019-07-27 DIAGNOSIS — K644 Residual hemorrhoidal skin tags: Secondary | ICD-10-CM | POA: Insufficient documentation

## 2019-07-27 DIAGNOSIS — Z882 Allergy status to sulfonamides status: Secondary | ICD-10-CM | POA: Insufficient documentation

## 2019-07-27 DIAGNOSIS — K319 Disease of stomach and duodenum, unspecified: Secondary | ICD-10-CM | POA: Diagnosis not present

## 2019-07-27 HISTORY — PX: BIOPSY: SHX5522

## 2019-07-27 HISTORY — PX: POLYPECTOMY: SHX5525

## 2019-07-27 HISTORY — PX: COLONOSCOPY: SHX5424

## 2019-07-27 HISTORY — PX: ESOPHAGOGASTRODUODENOSCOPY: SHX5428

## 2019-07-27 SURGERY — COLONOSCOPY
Anesthesia: Moderate Sedation

## 2019-07-27 MED ORDER — MIDAZOLAM HCL 5 MG/5ML IJ SOLN
INTRAMUSCULAR | Status: AC
  Filled 2019-07-27: qty 10

## 2019-07-27 MED ORDER — MIDAZOLAM HCL 5 MG/5ML IJ SOLN
INTRAMUSCULAR | Status: DC | PRN
  Administered 2019-07-27: 2 mg via INTRAVENOUS
  Administered 2019-07-27 (×3): 1 mg via INTRAVENOUS
  Administered 2019-07-27: 2 mg via INTRAVENOUS

## 2019-07-27 MED ORDER — MEPERIDINE HCL 50 MG/ML IJ SOLN
INTRAMUSCULAR | Status: DC | PRN
  Administered 2019-07-27 (×2): 25 mg

## 2019-07-27 MED ORDER — MEPERIDINE HCL 50 MG/ML IJ SOLN
INTRAMUSCULAR | Status: AC
  Filled 2019-07-27: qty 1

## 2019-07-27 MED ORDER — STERILE WATER FOR IRRIGATION IR SOLN
Status: DC | PRN
  Administered 2019-07-27: 200 mL

## 2019-07-27 MED ORDER — LIDOCAINE VISCOUS HCL 2 % MT SOLN
OROMUCOSAL | Status: DC | PRN
  Administered 2019-07-27: 1 via OROMUCOSAL

## 2019-07-27 MED ORDER — LIDOCAINE VISCOUS HCL 2 % MT SOLN
OROMUCOSAL | Status: AC
  Filled 2019-07-27: qty 15

## 2019-07-27 MED ORDER — SODIUM CHLORIDE 0.9 % IV SOLN: INTRAVENOUS | Status: DC

## 2019-07-27 MED ORDER — PANTOPRAZOLE SODIUM 40 MG PO TBEC: 40.0000 mg | DELAYED_RELEASE_TABLET | Freq: Two times a day (BID) | ORAL | 2 refills | Status: DC

## 2019-07-27 NOTE — Op Note (Signed)
[facility] Patient Name: Shane Osborne Procedure Date: 07/27/2019 9:43 AM MRN: [id number] Date of Birth: 1940/05/26 Attending MD: Hildred Laser , MD CSN: [id number] Age: 79 Admit Type: Outpatient Procedure:                Upper GI endoscopy Indications:              Nausea Providers:                Hildred Laser, MD, Jeanann Lewandowsky. Gwenlyn Perking RN, RN, Crystal                            Page, Randa Spike, Technician Referring MD:             Glenda Chroman, MD Medicines:                Lidocaine spray, Meperidine 50 mg IV, Midazolam 6                            mg IV Complications:            No immediate complications. Estimated Blood Loss:     Estimated blood loss was minimal. Procedure:                Pre-Anesthesia Assessment:                           - Prior to the procedure, a History and Physical                            was performed, and patient medications and                            allergies were reviewed. The patient's tolerance of                            previous anesthesia was also reviewed. The risks                            and benefits of the procedure and the sedation                            options and risks were discussed with the patient.                            All questions were answered, and informed consent                            was obtained. Prior Anticoagulants: The patient has                            taken no previous anticoagulant or antiplatelet                            agents. ASA Grade Assessment: II - A patient with  mild systemic disease. After reviewing the risks                            and benefits, the patient was deemed in                            satisfactory condition to undergo the procedure.                           After obtaining informed consent, the endoscope was                            passed under direct vision. Throughout the                            procedure, the patient's  blood pressure, pulse, and                            oxygen saturations were monitored continuously. The                            [id number]-H190 ([id number]) scope was introduced through the                            mouth, and advanced to the second part of duodenum.                            The upper GI endoscopy was accomplished without                            difficulty. The patient tolerated the procedure                            well. Scope In: 10:37:27 AM Scope Out: 10:45:44 AM Total Procedure Duration: 0 hours 8 minutes 17 seconds  Findings:      The hypopharynx was normal.      The examined esophagus was normal.      The Z-line was regular and was found 39 cm from the incisors.      One non-bleeding superficial gastric ulcer with pigmented material was       found on the lesser curvature of the gastric antrum. The lesion was       three mm by five mm in largest dimension. Biopsies were taken with a       cold forceps for histology. The pathology specimen was placed into       Bottle Number 1.      Patchy mild inflammation characterized by congestion (edema), erosions       and erythema was found in the gastric antrum and in the prepyloric       region of the stomach.      The duodenal bulb and second portion of the duodenum were normal. Impression:               - Normal hypopharynx.                           -  Normal esophagus.                           - Z-line regular, 39 cm from the incisors.                           - Non-bleeding gastric ulcer with pigmented                            material. Biopsied.                           - Gastritis.                           - Normal duodenal bulb and second portion of the                            duodenum. Moderate Sedation:      Moderate (conscious) sedation was administered by the endoscopy nurse       and supervised by the endoscopist. The following parameters were       monitored: oxygen saturation, heart rate,  blood pressure, CO2       capnography and response to care. Total physician intraservice time was       14 minutes. Recommendation:           - Patient has a contact number available for                            emergencies. The signs and symptoms of potential                            delayed complications were discussed with the                            patient. Return to normal activities tomorrow.                            Written discharge instructions were provided to the                            patient.                           - Resume previous diet today.                           - Continue present medications.                           - No aspirin, ibuprofen, naproxen, or other                            non-steroidal anti-inflammatory drugs.                           - Await pathology results.                           -  Use Protonix (pantoprazole) 40 mg PO BID. Procedure Code(s):        --- Professional ---                           [phone], Esophagogastroduodenoscopy, flexible,                            transoral; with biopsy, single or multiple                           G0500, Moderate sedation services provided by the                            same physician or other qualified health care                            professional performing a gastrointestinal                            endoscopic service that sedation supports,                            requiring the presence of an independent trained                            observer to assist in the monitoring of the                            patient's level of consciousness and physiological                            status; initial 15 minutes of intra-service time;                            patient age 5 years or older (additional time may                            be reported with [id number], as appropriate) Diagnosis Code(s):        --- Professional ---                           K25.9, Gastric ulcer,  unspecified as acute or                            chronic, without hemorrhage or perforation                           K29.70, Gastritis, unspecified, without bleeding                           R11.0, Nausea CPT copyright 2019 [employer]. All rights reserved. The codes documented in this report are preliminary and upon coder review may  be revised to meet current compliance requirements. Hildred Laser, MD Hildred Laser, MD 07/27/2019 11:18:53 AM This report has been signed electronically. Number of  Addenda: 0

## 2019-07-27 NOTE — Op Note (Signed)
[facility] Patient Name: Shane Osborne Procedure Date: 07/27/2019 10:46 AM MRN: [id number] Date of Birth: 04-14-1940 Attending MD: Hildred Laser , MD CSN: [id number] Age: 79 Admit Type: Outpatient Procedure:                Colonoscopy Indications:              High risk colon cancer surveillance: Personal                            history of colonic polyps Providers:                Hildred Laser, MD Referring MD:             Glenda Chroman, MD Medicines:                Midazolam 1 mg IV Complications:            No immediate complications. Estimated Blood Loss:     Estimated blood loss was minimal. Procedure:                Pre-Anesthesia Assessment:                           - Prior to the procedure, a History and Physical                            was performed, and patient medications and                            allergies were reviewed. The patient's tolerance of                            previous anesthesia was also reviewed. The risks                            and benefits of the procedure and the sedation                            options and risks were discussed with the patient.                            All questions were answered, and informed consent                            was obtained. Prior Anticoagulants: The patient has                            taken no previous anticoagulant or antiplatelet                            agents. ASA Grade Assessment: II - A patient with                            mild systemic disease. After reviewing the risks  and benefits, the patient was deemed in                            satisfactory condition to undergo the procedure.                           - Prior to the procedure, a History and Physical                            was performed, and patient medications and                            allergies were reviewed. The patient's tolerance of                            previous anesthesia was  also reviewed. The risks                            and benefits of the procedure and the sedation                            options and risks were discussed with the patient.                            All questions were answered, and informed consent                            was obtained. Prior Anticoagulants: The patient has                            taken no previous anticoagulant or antiplatelet                            agents. ASA Grade Assessment: II - A patient with                            mild systemic disease. After reviewing the risks                            and benefits, the patient was deemed in                            satisfactory condition to undergo the procedure.                           After obtaining informed consent, the colonoscope                            was passed under direct vision. Throughout the                            procedure, the patient's blood pressure, pulse, and  oxygen saturations were monitored continuously. The                            PCF-[id number] ([id number]) scope was introduced through                            the anus and advanced to the the cecum, identified                            by appendiceal orifice and ileocecal valve. The                            colonoscopy was performed without difficulty. The                            patient tolerated the procedure well. The quality                            of the bowel preparation was good. The ileocecal                            valve, appendiceal orifice, and rectum were                            photographed. Scope In: 10:48:06 AM Scope Out: 11:02:47 AM Scope Withdrawal Time: 0 hours 7 minutes 33 seconds  Total Procedure Duration: 0 hours 14 minutes 41 seconds  Findings:      The perianal and digital rectal examinations were normal.      A few diverticula were found in the sigmoid colon and hepatic flexure.      A small polyp was found in  the hepatic flexure. The polyp was removed       with a cold snare. Resection and retrieval were complete. The pathology       specimen was placed into Bottle Number 2.      There was a medium-sized lipoma, in the descending colon.      External and internal hemorrhoids were found during retroflexion. The       hemorrhoids were small. Impression:               - Diverticulosis in the sigmoid colon and at the                            hepatic flexure.                           - One small polyp at the hepatic flexure, removed                            with a cold snare. Resected and retrieved.                           - Medium-sized lipoma in the descending colon.                           - External  and internal hemorrhoids. Moderate Sedation:      Moderate (conscious) sedation was administered by the endoscopy nurse       and supervised by the endoscopist. The following parameters were       monitored: oxygen saturation, heart rate, blood pressure, CO2       capnography and response to care. Total physician intraservice time was       17 minutes. Recommendation:           - Patient has a contact number available for                            emergencies. The signs and symptoms of potential                            delayed complications were discussed with the                            patient. Return to normal activities tomorrow.                            Written discharge instructions were provided to the                            patient.                           - High fiber diet today.                           - Continue present medications.                           - Await pathology results.                           - No recommendation at this time regarding repeat                            colonoscopy. Procedure Code(s):        --- Professional ---                           [phone], Colonoscopy, flexible; with removal of                            tumor(s),  polyp(s), or other lesion(s) by snare                            technique                           G0500, Moderate sedation services provided by the                            same physician or other qualified health care  professional performing a gastrointestinal                            endoscopic service that sedation supports,                            requiring the presence of an independent trained                            observer to assist in the monitoring of the                            patient's level of consciousness and physiological                            status; initial 15 minutes of intra-service time;                            patient age 71 years or older (additional time may                            be reported with [phone], as appropriate) Diagnosis Code(s):        --- Professional ---                           Z86.010, Personal history of colonic polyps                           K64.8, Other hemorrhoids                           K63.5, Polyp of colon                           D17.5, Benign lipomatous neoplasm of                            intra-abdominal organs                           K57.30, Diverticulosis of large intestine without                            perforation or abscess without bleeding CPT copyright 2019 American Medical Association. All rights reserved. The codes documented in this report are preliminary and upon coder review may  be revised to meet current compliance requirements. Hildred Laser, MD Hildred Laser, MD 07/27/2019 11:25:03 AM This report has been signed electronically. Number of Addenda: 0

## 2019-07-27 NOTE — H&P (Signed)
Shane Osborne is an 79 y.o. male.   Chief Complaint: Patient is here for esophagogastroduodenoscopy and colonoscopy. HPI: Patient is 79 year old Caucasian male who has chronic nausea without vomiting.  He also denies heartburn dysphagia or abdominal pain.  He states since he has been taking probiotic his nausea has improved but not gone away.  Nausea occurs every day.  It is not preventing him from eating or doing his daily tasks.  He also has a history of colonic adenomas and undergoing surveillance colonoscopy.  He denies rectal bleeding or recent change in his bowel habits. Family history is negative for CRC.  Past Medical History:  Diagnosis Date  . Asthma    Childhood  . GERD (gastroesophageal reflux disease)   . Hyperlipidemia   . Hypertension     Past Surgical History:  Procedure Laterality Date  . COLONOSCOPY  10/01/2010   Procedure: COLONOSCOPY;  Surgeon: Rogene Houston, MD;  Location: [facility];  Service: Endoscopy;  Laterality: N/A;  . COLONOSCOPY N/A 09/01/2013   Procedure: COLONOSCOPY;  Surgeon: Rogene Houston, MD;  Location: [facility];  Service: Endoscopy;  Laterality: N/A;  940  . ESOPHAGOGASTRODUODENOSCOPY    . ESOPHAGOGASTRODUODENOSCOPY N/A 04/11/2014   Procedure: ESOPHAGOGASTRODUODENOSCOPY (EGD);  Surgeon: Rogene Houston, MD;  Location: [facility];  Service: Endoscopy;  Laterality: N/A;  1200 - moved to 12:55 - Ann to notify pt  . LEFT HEART CATH AND CORONARY ANGIOGRAPHY N/A 03/23/2016   Procedure: Left Heart Cath and Coronary Angiography;  Surgeon: Sherren Mocha, MD;  Location: [facility];  Service: Cardiovascular;  Laterality: N/A;  . right leg  20 years ago   X 8 secondary to fracture  . rt inguinal      hernia repair 2 months ago    Family History  Problem Relation Age of Onset  . Cancer Mother   . Heart attack Father    Social History:  reports that he has never smoked. He has never used smokeless tobacco. He reports current alcohol use of  about 5.0 standard drinks of alcohol per week. He reports that he does not use drugs.  Allergies:  Allergies  Allergen Reactions  . Other     [place] nuts - break out in hive throat swelling   . Sulfa Antibiotics Rash  . Vancomycin Rash    Medications Prior to Admission  Medication Sig Dispense Refill  . carvedilol (COREG) 12.5 MG tablet Take 18.75 mg by mouth 2 (two) times daily.    . irbesartan (AVAPRO) 300 MG tablet TAKE 1 TABLET BY MOUTH EVERY DAY (Patient taking differently: Take 300 mg by mouth daily. ) 30 tablet 6  . Multiple Vitamin (MULTIVITAMIN) tablet Take 1 tablet by mouth daily.    . Omega-3 Fatty Acids (FISH OIL) 1200 MG CAPS Take 1 capsule by mouth 2 (two) times daily before a meal.     . potassium chloride SA (KLOR-CON M20) 20 MEQ tablet Take 1 tablet (20 mEq total) by mouth daily. 90 tablet 3  . Probiotic Product (PROBIOTIC PO) Take 1 capsule by mouth daily.     . rosuvastatin (CRESTOR) 20 MG tablet Take 1 tablet (20 mg total) by mouth daily. 30 tablet 6    Results for orders placed or performed during the hospital encounter of 07/25/19 (from the past 48 hour(s))  SARS CORONAVIRUS 2 (TAT 6-24 HRS) Nasopharyngeal Nasopharyngeal Swab     Status: None   Collection Time: 07/25/19 11:00 AM   Specimen:  Nasopharyngeal Swab  Result Value Ref Range   SARS Coronavirus 2 NEGATIVE NEGATIVE    Comment: (NOTE) SARS-CoV-2 target nucleic acids are NOT DETECTED.  The SARS-CoV-2 RNA is generally detectable in upper and lower respiratory specimens during the acute phase of infection. Negative results do not preclude SARS-CoV-2 infection, do not rule out co-infections with other pathogens, and should not be used as the sole basis for treatment or other patient management decisions. Negative results must be combined with clinical observations, patient history, and epidemiological information. The expected result is Negative.  Fact Sheet for  Patients: SugarRoll.be  Fact Sheet for Healthcare Providers: https://www.woods-mathews.com/  This test is not yet approved or cleared by the [place] FDA and  has been authorized for detection and/or diagnosis of SARS-CoV-2 by FDA under an Emergency Use Authorization (EUA). This EUA will remain  in effect (meaning this test can be used) for the duration of the COVID-19 declaration under Se ction 564(b)(1) of the Act, 21 U.S.C. section 360bbb-3(b)(1), unless the authorization is terminated or revoked sooner.  Performed at [facility], [place] [address] [place], [place] [address]    No results found.  Review of Systems  Blood pressure 137/75, pulse 75, temperature 98.1 F (36.7 C), temperature source Oral, resp. rate 18, height 5\' 9"  (1.753 m), weight 98.4 kg, SpO2 97 %. Physical Exam HENT:     Mouth/Throat:     Mouth: Mucous membranes are moist.     Pharynx: Oropharynx is clear.  Eyes:     General: No scleral icterus.    Conjunctiva/sclera: Conjunctivae normal.  Cardiovascular:     Rate and Rhythm: Normal rate and regular rhythm.     Heart sounds: Murmur heard.      Comments: Grade 2/6 systolic murmur best heard at aortic area. Pulmonary:     Effort: Pulmonary effort is normal.     Breath sounds: Normal breath sounds.  Abdominal:     Comments: Abdomen is full but soft and nontender with no organomegaly or masses.  Musculoskeletal:        General: No swelling.     Cervical back: Neck supple.  Lymphadenopathy:     Cervical: No cervical adenopathy.  Skin:    General: Skin is warm and dry.  Neurological:     Mental Status: He is alert.  Psychiatric:        Mood and Affect: Mood normal.      Assessment/Plan Chronic nausea. History of colonic adenomas Diagnostic esophagogastroduodenoscopy and surveillance colonoscopy.  Hildred Laser, MD 07/27/2019, 10:26 AM

## 2019-07-27 NOTE — Discharge Instructions (Signed)
Colonoscopy, Adult, Care After This sheet gives you information about how to care for yourself after your procedure. Your health care provider may also give you more specific instructions. If you have problems or questions, contact your health care provider. What can I expect after the procedure? After the procedure, it is common to have:  A small amount of blood in your stool for 24 hours after the procedure.  Some gas.  Mild cramping or bloating of your abdomen. Follow these instructions at home: Eating and drinking   Drink enough fluid to keep your urine pale yellow.  Follow instructions from your health care provider about eating or drinking restrictions.  Resume your normal diet as instructed by your health care provider. Avoid heavy or fried foods that are hard to digest. Activity  Rest as told by your health care provider.  Avoid sitting for a long time without moving. Get up to take short walks every 1-2 hours. This is important to improve blood flow and breathing. Ask for help if you feel weak or unsteady.  Return to your normal activities as told by your health care provider. Ask your health care provider what activities are safe for you. Managing cramping and bloating   Try walking around when you have cramps or feel bloated.  Apply heat to your abdomen as told by your health care provider. Use the heat source that your health care provider recommends, such as a moist heat pack or a heating pad. ? Place a towel between your skin and the heat source. ? Leave the heat on for 20-30 minutes. ? Remove the heat if your skin turns bright red. This is especially important if you are unable to feel pain, heat, or cold. You may have a greater risk of getting burned. General instructions  For the first 24 hours after the procedure: ? Do not drive or use machinery. ? Do not sign important documents. ? Do not drink alcohol. ? Do your regular daily activities at a slower pace  than normal. ? Eat soft foods that are easy to digest.  Take over-the-counter and prescription medicines only as told by your health care provider.  Keep all follow-up visits as told by your health care provider. This is important. Contact a health care provider if:  You have blood in your stool 2-3 days after the procedure. Get help right away if you have:  More than a small spotting of blood in your stool.  Large blood clots in your stool.  Swelling of your abdomen.  Nausea or vomiting.  A fever.  Increasing pain in your abdomen that is not relieved with medicine. Summary  After the procedure, it is common to have a small amount of blood in your stool. You may also have mild cramping and bloating of your abdomen.  For the first 24 hours after the procedure, do not drive or use machinery, sign important documents, or drink alcohol.  Get help right away if you have a lot of blood in your stool, nausea or vomiting, a fever, or increased pain in your abdomen. This information is not intended to replace advice given to you by your health care provider. Make sure you discuss any questions you have with your health care provider. Document Revised: 08/08/2018 Document Reviewed: 08/08/2018 Elsevier Patient Education  2020 Elsevier Inc.   Colon Polyps  Polyps are tissue growths inside the body. Polyps can grow in many places, including the large intestine (colon). A polyp may be a round   bump or a mushroom-shaped growth. You could have one polyp or several. Most colon polyps are noncancerous (benign). However, some colon polyps can become cancerous over time. Finding and removing the polyps early can help prevent this. What are the causes? The exact cause of colon polyps is not known. What increases the risk? You are more likely to develop this condition if you:  Have a family history of colon cancer or colon polyps.  Are older than 50 or older than 45 if you are African  American.  Have inflammatory bowel disease, such as ulcerative colitis or Crohn's disease.  Have certain hereditary conditions, such as: ? Familial adenomatous polyposis. ? Lynch syndrome. ? [place] syndrome. ? Peutz-Jeghers syndrome.  Are overweight.  Smoke cigarettes.  Do not get enough exercise.  Drink too much alcohol.  Eat a diet that is high in fat and red meat and low in fiber.  Had childhood cancer that was treated with abdominal radiation. What are the signs or symptoms? Most polyps do not cause symptoms. If you have symptoms, they may include:  Blood coming from your rectum when having a bowel movement.  Blood in your stool. The stool may look dark red or black.  Abdominal pain.  A change in bowel habits, such as constipation or diarrhea. How is this diagnosed? This condition is diagnosed with a colonoscopy. This is a procedure in which a lighted, flexible scope is inserted into the anus and then passed into the colon to examine the area. Polyps are sometimes found when a colonoscopy is done as part of routine cancer screening tests. How is this treated? Treatment for this condition involves removing any polyps that are found. Most polyps can be removed during a colonoscopy. Those polyps will then be tested for cancer. Additional treatment may be needed depending on the results of testing. Follow these instructions at home: Lifestyle  Maintain a healthy weight, or lose weight if recommended by your health care provider.  Exercise every day or as told by your health care provider.  Do not use any products that contain nicotine or tobacco, such as cigarettes and e-cigarettes. If you need help quitting, ask your health care provider.  If you drink alcohol, limit how much you have: ? 0-1 drink a day for women. ? 0-2 drinks a day for men.  Be aware of how much alcohol is in your drink. In the [place] one drink equals one 12 oz bottle of beer (355 mL), one 5 oz glass  of wine (148 mL), or one 1 oz shot of hard liquor (44 mL). Eating and drinking   Eat foods that are high in fiber, such as fruits, vegetables, and whole grains.  Eat foods that are high in calcium and vitamin D, such as milk, cheese, yogurt, eggs, liver, fish, and broccoli.  Limit foods that are high in fat, such as fried foods and desserts.  Limit the amount of red meat and processed meat you eat, such as hot dogs, sausage, bacon, and lunch meats. General instructions  Keep all follow-up visits as told by your health care provider. This is important. ? This includes having regularly scheduled colonoscopies. ? Talk to your health care provider about when you need a colonoscopy. Contact a health care provider if:  You have new or worsening bleeding during a bowel movement.  You have new or increased blood in your stool.  You have a change in bowel habits.  You lose weight for no known reason. Summary    tissue growths inside the body. Polyps can grow in many places, including the colon.  Most colon polyps are noncancerous (benign), but some can become cancerous over time.  This condition is diagnosed with a colonoscopy.  Treatment for this condition involves removing any polyps that are found. Most polyps can be removed during a colonoscopy. This information is not intended to replace advice given to you by your health care provider. Make sure you discuss any questions you have with your health care provider. Document Revised: 04/29/2017 Document Reviewed: 04/29/2017 Elsevier Patient Education  [place]. No aspirin or NSAIDs. Resume usual medications as before. Pantoprazole 40 mg by mouth 30 minutes before breakfast and evening meal daily. Resume usual diet. No driving for 24 hours. Physician will call with biopsy results.

## 2019-07-28 ENCOUNTER — Other Ambulatory Visit: Payer: Self-pay

## 2019-07-28 LAB — SURGICAL PATHOLOGY

## 2019-08-02 ENCOUNTER — Ambulatory Visit: Payer: Medicare Other | Admitting: Cardiovascular Disease

## 2019-08-04 ENCOUNTER — Encounter (HOSPITAL_COMMUNITY): Payer: Self-pay | Admitting: Internal Medicine

## 2019-08-07 NOTE — Progress Notes (Signed)
Cardiology Office Note  Date: 08/08/2019   ID: Hailey, Miles [birth year], MRN [id number]  PCP:  Glenda Chroman, MD  Cardiologist:  Kate Sable, MD Electrophysiologist:  None   Chief Complaint: Follow-up shortness of breath  History of Present Illness: Shane Osborne is a 79 y.o. male with a history of nonobstructive CAD by catheterization 2018, mild to moderate aortic stenosis, HTN, HLD.  Saw Dr. Bronson Ing on 05/25/2019 complaining of increasing exertional dyspnea from previous visit in October 2020.  Stating he had no energy.  Previous echocardiogram showed mild to moderate aortic stenosis July 2020.  Repeat echocardiogram was ordered for increased exertional dyspnea.  Patient was having remote blood pressures checked by his PCPs office and blood pressure had been well controlled but was elevated on that particular visit at 156/78.  He was currently on carvedilol 18.75 mg p.o. twice daily.  He was also taking Avapro 300 mg every morning.  No adjustments were made to antihypertensive medications.  Mitral regurgitation was moderate in severity by echo and 08/15/2018.  Echocardiogram to be repeated.  For dyspnea on exertion coronary CT angiography was ordered to check for developing obstructive coronary artery disease since last catheterization in February 2018.  Nuclear stress test was normal.  CT of the chest showed appearance of the lungs could suggest early or mild interstitial lung disease.  Outpatient referral to pulmonology to further evaluation was recommended.  Also follow-up high resolution chest CT should also be considered  Patient returns today for follow-up.  He states nothing has changed in his dyspnea on exertion.  He states as long as he is on level ground and not overly exerting himself its okay.  States when ascending stairs are doing more than normal activity is when the shortness of breath becomes worse.  We discussed the results of his echocardiogram and his recent  CT of the chest as well as stress test.  He denies any anginal symptoms, orthostatic symptoms, palpitations or arrhythmias, CVA or TIA-like symptoms, PND, orthopnea.  States he has noticed some swelling in his lower legs over the last month.  This is a concern for him when combined with his shortness of breath.  Past Medical History:  Diagnosis Date  . Asthma    Childhood  . GERD (gastroesophageal reflux disease)   . Hyperlipidemia   . Hypertension     Past Surgical History:  Procedure Laterality Date  . BIOPSY  07/27/2019   Procedure: BIOPSY;  Surgeon: Rogene Houston, MD;  [facility];  Service: Endoscopy;;  . COLONOSCOPY  10/01/2010   Procedure: COLONOSCOPY;  Surgeon: Rogene Houston, MD;  [facility];  Service: Endoscopy;  Laterality: N/A;  . COLONOSCOPY N/A 09/01/2013   Procedure: COLONOSCOPY;  Surgeon: Rogene Houston, MD;  [facility];  Service: Endoscopy;  Laterality: N/A;  940  . COLONOSCOPY N/A 07/27/2019   Procedure: COLONOSCOPY;  Surgeon: Rogene Houston, MD;  [facility];  Service: Endoscopy;  Laterality: N/A;  1030  . ESOPHAGOGASTRODUODENOSCOPY    . ESOPHAGOGASTRODUODENOSCOPY N/A 04/11/2014   Procedure: ESOPHAGOGASTRODUODENOSCOPY (EGD);  Surgeon: Rogene Houston, MD;  [facility];  Service: Endoscopy;  Laterality: N/A;  1200 - moved to 12:55 - Ann to notify pt  . ESOPHAGOGASTRODUODENOSCOPY N/A 07/27/2019   Procedure: ESOPHAGOGASTRODUODENOSCOPY (EGD);  Surgeon: Rogene Houston, MD;  [facility];  Service: Endoscopy;  Laterality: N/A;  . LEFT HEART CATH AND CORONARY ANGIOGRAPHY N/A  03/23/2016   Procedure: Left Heart Cath and Coronary Angiography;  Surgeon: Sherren Mocha, MD;  Location: [facility];  Service: Cardiovascular;  Laterality: N/A;  . POLYPECTOMY  07/27/2019   Procedure: POLYPECTOMY;  Surgeon: Rogene Houston, MD;  [facility];  Service: Endoscopy;;  . right leg  20 years ago   X 8  secondary to fracture  . rt inguinal      hernia repair 2 months ago    Current Outpatient Medications  Medication Sig Dispense Refill  . carvedilol (COREG) 12.5 MG tablet Take 18.75 mg by mouth 2 (two) times daily.    . irbesartan (AVAPRO) 300 MG tablet TAKE 1 TABLET BY MOUTH EVERY DAY (Patient taking differently: Take 300 mg by mouth daily. ) 30 tablet 6  . Multiple Vitamin (MULTIVITAMIN) tablet Take 1 tablet by mouth daily.    . Omega-3 Fatty Acids (FISH OIL) 1200 MG CAPS Take 1 capsule by mouth 2 (two) times daily before a meal.     . pantoprazole (PROTONIX) 40 MG tablet Take 1 tablet (40 mg total) by mouth 2 (two) times daily before a meal. 60 tablet 2  . potassium chloride SA (KLOR-CON M20) 20 MEQ tablet Take 1 tablet (20 mEq total) by mouth daily. 90 tablet 3  . Probiotic Product (PROBIOTIC PO) Take 1 capsule by mouth daily.     . rosuvastatin (CRESTOR) 20 MG tablet Take 1 tablet (20 mg total) by mouth daily. 30 tablet 6  . furosemide (LASIX) 20 MG tablet Take 1 tablet (20 mg total) by mouth daily as needed (swelling). 30 tablet 1   No current facility-administered medications for this visit.   Allergies:  Other, Sulfa antibiotics, and Vancomycin   Social History: The patient  reports that he has never smoked. He has never used smokeless tobacco. He reports current alcohol use of about 5.0 standard drinks of alcohol per week. He reports that he does not use drugs.   Family History: The patient's family history includes Cancer in his mother; Heart attack in his father.   ROS:  Please see the history of present illness. Otherwise, complete review of systems is positive for none.  All other systems are reviewed and negative.   Physical Exam: VS:  BP 108/72   Pulse (!) 57   Ht 5\' 9"  (1.753 m)   Wt 215 lb (97.5 kg)   SpO2 97%   BMI 31.75 kg/m , BMI Body mass index is 31.75 kg/m.  Wt Readings from Last 3 Encounters:  08/08/19 215 lb (97.5 kg)  07/27/19 216 lb 14.9 oz (98.4 kg)   07/05/19 217 lb (98.4 kg)    General: Patient appears comfortable at rest. Neck: Supple, no elevated JVP or carotid bruits, no thyromegaly. Lungs: Clear to auscultation, nonlabored breathing at rest. Cardiac: Regular rate and rhythm, no S3 or significant systolic murmur, no pericardial rub. Extremities: No pitting edema, distal pulses 2+. Skin: Warm and dry. Musculoskeletal: No kyphosis. Neuropsychiatric: Alert and oriented x3, affect grossly appropriate.  ECG:    Recent Labwork: 06/07/2019: BUN 16; Creatinine, Ser 1.06; Potassium 4.3; Sodium 139  No results found for: CHOL, TRIG, HDL, CHOLHDL, VLDL, LDLCALC, LDLDIRECT  Other Studies Reviewed Today:   Nuclear stress test 07/07/2019 Study Result  Narrative & Impression    The study is normal.  This is a low risk study.  The left ventricular ejection fraction is normal (55-65%).  There was no ST segment deviation noted during stress.   Diaphragmatic  attenuation no ischemia Low risk study EF 59%        Echocardiogram 05/25/2019 IMPRESSIONS  1. Left ventricular ejection fraction, by estimation, is 60 to 65%. The  left ventricle has normal function. The left ventricle has no regional  wall motion abnormalities. There is mild left ventricular hypertrophy of  the posterior segment. Left  ventricular diastolic parameters are consistent with Grade I diastolic  dysfunction (impaired relaxation). Elevated left ventricular end-diastolic  pressure.  2. Right ventricular systolic function is normal. The right ventricular  size is normal. There is moderately elevated pulmonary artery systolic  pressure.  3. The mitral valve is degenerative. Mild mitral valve regurgitation.  4. Morphologically, aortic stenosis appeared to be in the moderate range.  The aortic valve is tricuspid. Aortic valve regurgitation is trivial.  Moderate aortic valve stenosis. Aortic valve area, by VTI measures 0.98  cm. Aortic valve mean  gradient  measures 16.8 mmHg. Aortic valve Vmax measures 2.70 m/s.   Comparison(s): Echocardiogram done 08/24/18 showed an EF of 65% with mild to moderate AS and an AV Peak Grad of 28 mmHg.    Coronary CT 06/08/2019 IMPRESSION: 1. The appearance of the lungs could suggest early or mild interstitial lung disease. Outpatient referral to Pulmonology for further evaluation is recommended. Follow-up high-resolution chest CT should also be considered. 2. Aortic Atherosclerosis (ICD10-I70.0).   Assessment and Plan:  1. DOE (dyspnea on exertion)   2. Aortic valve stenosis, etiology of cardiac valve disease unspecified   3. Essential hypertension   4. Mixed hyperlipidemia   5. Mitral valve insufficiency, unspecified etiology   6. Interstitial lung disease ([place])   7. Abnormal chest CT   8. Lower extremity edema    1. DOE (dyspnea on exertion)/abnormal CT the chest/possible interstitial lung disease Referral to pulmonary if not already ordered.  High-resolution CT of the chest were recommended by reading radiologist.  Evidence of mild or early interstitial lung disease present on previous chest CT.  Please refer [facility]  pulmonary clinic for evaluation of dyspnea on exertion and abnormal chest CT.  Will defer to them for decision on high resolution chest CT recommended by radiologist.  His recent stress test was normal and low risk.  Apparently coronary CT with FFR was not done.  2. Aortic valve stenosis, etiology of cardiac valve disease unspecified Echocardiogram 05/25/2019 showed EF 60 to 65%, mild LVH, grade 1 DD, moderately elevated PASP, mild MR, moderate aortic stenosis.  He continues to have dyspnea on exertion but had an abnormal CT of the chest.  3. Essential hypertension He is normotensive today with a blood pressure 108/72.  Continue Coreg 12.5 mg p.o. twice daily.  Continue irbesartan 300 mg daily.  4. Mixed hyperlipidemia Continue Crestor 20 mg daily.  We do not have a recent  lipid panel.  We will attempt to obtain recent lipid results from PCP  5. Mitral valve insufficiency, unspecified etiology Mild MR on recent echo 05/25/2019.  6.  Lower extremity edema. Patient states he has been noticing increasing lower extremity edema over the last month.  Start Lasix 20 mg p.o. daily as needed for lower extremity edema.  If patient needs to increase dose to a scheduled dose we will need to perform a basic metabolic panel and magnesium.   Medication Adjustments/Labs and Tests Ordered: Current medicines are reviewed at length with the patient today.  Concerns regarding medicines are outlined above.   Disposition: Follow-up with Dr. Domenic Polite or APP 3 months  Signed, Mitzi Hansen  Leonides Sake, NP 08/08/2019 11:41 AM    [facility] [place], [place], [place] [address] Phone: [phone]; Fax: [phone]

## 2019-08-08 ENCOUNTER — Ambulatory Visit (INDEPENDENT_AMBULATORY_CARE_PROVIDER_SITE_OTHER): Payer: Medicare Other | Admitting: Family Medicine

## 2019-08-08 ENCOUNTER — Encounter: Payer: Self-pay | Admitting: Family Medicine

## 2019-08-08 ENCOUNTER — Institutional Professional Consult (permissible substitution): Payer: Medicare Other | Admitting: Pulmonary Disease

## 2019-08-08 VITALS — BP 108/72 | HR 57 | Ht 69.0 in | Wt 215.0 lb

## 2019-08-08 DIAGNOSIS — I340 Nonrheumatic mitral (valve) insufficiency: Secondary | ICD-10-CM

## 2019-08-08 DIAGNOSIS — E782 Mixed hyperlipidemia: Secondary | ICD-10-CM | POA: Diagnosis not present

## 2019-08-08 DIAGNOSIS — R600 Localized edema: Secondary | ICD-10-CM

## 2019-08-08 DIAGNOSIS — R0609 Other forms of dyspnea: Secondary | ICD-10-CM

## 2019-08-08 DIAGNOSIS — R9389 Abnormal findings on diagnostic imaging of other specified body structures: Secondary | ICD-10-CM

## 2019-08-08 DIAGNOSIS — R0600 Dyspnea, unspecified: Secondary | ICD-10-CM | POA: Diagnosis not present

## 2019-08-08 DIAGNOSIS — I350 Nonrheumatic aortic (valve) stenosis: Secondary | ICD-10-CM | POA: Diagnosis not present

## 2019-08-08 DIAGNOSIS — I10 Essential (primary) hypertension: Secondary | ICD-10-CM | POA: Diagnosis not present

## 2019-08-08 DIAGNOSIS — J849 Interstitial pulmonary disease, unspecified: Secondary | ICD-10-CM

## 2019-08-08 MED ORDER — FUROSEMIDE 20 MG PO TABS
20.0000 mg | ORAL_TABLET | Freq: Every day | ORAL | 1 refills | Status: DC | PRN
Start: 2019-08-08 — End: 2019-10-04

## 2019-08-08 NOTE — Patient Instructions (Addendum)
Medication Instructions:   Your physician has recommended you make the following change in your medication:   Start furosemide 20 mg by mouth daily as needed for swelling  Continue other medications the same  Labwork:  NONE  Testing/Procedures:  NONE  Follow-Up:  Your physician recommends that you schedule a follow-up appointment in: 3 months (office).  Any Other Special Instructions Will Be Listed Below (If Applicable).  You have been referred to [facility] Pulmonology  If you need a refill on your cardiac medications before your next appointment, please call your pharmacy.

## 2019-08-09 ENCOUNTER — Other Ambulatory Visit: Payer: Self-pay

## 2019-08-09 ENCOUNTER — Encounter: Payer: Self-pay | Admitting: Urology

## 2019-08-09 ENCOUNTER — Ambulatory Visit: Payer: Medicare Other | Admitting: Urology

## 2019-08-09 VITALS — BP 138/83 | HR 69 | Temp 97.5°F | Ht 69.0 in | Wt 215.0 lb

## 2019-08-09 DIAGNOSIS — N3943 Post-void dribbling: Secondary | ICD-10-CM | POA: Diagnosis not present

## 2019-08-09 DIAGNOSIS — R102 Pelvic and perineal pain: Secondary | ICD-10-CM | POA: Diagnosis not present

## 2019-08-09 MED ORDER — FLUCONAZOLE 100 MG PO TABS
100.0000 mg | ORAL_TABLET | Freq: Every day | ORAL | 0 refills | Status: DC
Start: 2019-08-09 — End: 2019-09-29

## 2019-08-09 NOTE — Progress Notes (Signed)
08/09/2019 10:21 AM   Shane Osborne [birth year] [id number]  Referring provider: Glenda Chroman, MD [place],  [place] [id number]  Terminal dribbling  HPI: Shane Osborne is a 79yo here for followup for post void dribbling and balanitis. He continues to be off BPH therapy and noted mild dribbling. We waits 5 seconds after voiding to ensure he is empty which decreases dribbling events. No other significant LUTS. He has occasional balanitis for which he uses clotrimazole cream. No penile pain His records from [facility] are as follows: I have pain in the groin.  HPI: Shane Osborne is a 79 year-old male established patient who is here for groin pain.  The problem is on both sides. His pain started about approximately 12/26/2017. The pain is dull. The intensity of his pain is rated as a 2. The pain is intermittent. The pain does not radiate.   None< makes the pain better. Nothing causes the pain to become worse. He has not been treated with any pain medications. He has not had this same pain previously.   05/20/2018: For the past 4 months he has noted worsening groin discomfort and he noted a rash. He was given cream by Dr. Woody Seller which improved the rash but it returns quickly after stopping the stream   06/23/2018: Balanitis improved with clotrimazole BID   09/23/2018: He uses the clotrimazole cream 1x per week.     CC: I have dribbling at the end of urination.  HPI: He first noticed the symptoms approximately 05/26/2017. His symptoms have been worse over the last year. He does not have an abnormal sensation when needing to urinate. He usually gets up at night to urinate 1 time. He has not seen blood in his urine.   He does not have a history of urethral strictures. He has been treated with Flomax. The patient has never had a surgical procedure for bladder outlet obstruction to his prostate.   06/23/2018: The flomax failed to improve the dribbling   09/23/2018: dribbling has improved since last visit. He  is not on any BPH therapy. Nocturia also improved     PMH: Past Medical History:  Diagnosis Date  . Asthma    Childhood  . GERD (gastroesophageal reflux disease)   . Hyperlipidemia   . Hypertension     Surgical History: Past Surgical History:  Procedure Laterality Date  . BIOPSY  07/27/2019   Procedure: BIOPSY;  Surgeon: Rogene Houston, MD;  [facility];  Service: Endoscopy;;  . COLONOSCOPY  10/01/2010   Procedure: COLONOSCOPY;  Surgeon: Rogene Houston, MD;  [facility];  Service: Endoscopy;  Laterality: N/A;  . COLONOSCOPY N/A 09/01/2013   Procedure: COLONOSCOPY;  Surgeon: Rogene Houston, MD;  [facility];  Service: Endoscopy;  Laterality: N/A;  940  . COLONOSCOPY N/A 07/27/2019   Procedure: COLONOSCOPY;  Surgeon: Rogene Houston, MD;  [facility];  Service: Endoscopy;  Laterality: N/A;  1030  . ESOPHAGOGASTRODUODENOSCOPY    . ESOPHAGOGASTRODUODENOSCOPY N/A 04/11/2014   Procedure: ESOPHAGOGASTRODUODENOSCOPY (EGD);  Surgeon: Rogene Houston, MD;  [facility];  Service: Endoscopy;  Laterality: N/A;  1200 - moved to 12:55 - Ann to notify pt  . ESOPHAGOGASTRODUODENOSCOPY N/A 07/27/2019   Procedure: ESOPHAGOGASTRODUODENOSCOPY (EGD);  Surgeon: Rogene Houston, MD;  [facility];  Service: Endoscopy;  Laterality: N/A;  . LEFT HEART CATH AND CORONARY ANGIOGRAPHY N/A 03/23/2016   Procedure: Left Heart Cath and  Coronary Angiography;  Surgeon: Sherren Mocha, MD;  Location: [facility];  Service: Cardiovascular;  Laterality: N/A;  . POLYPECTOMY  07/27/2019   Procedure: POLYPECTOMY;  Surgeon: Rogene Houston, MD;  [facility];  Service: Endoscopy;;  . right leg  20 years ago   X 8 secondary to fracture  . rt inguinal      hernia repair 2 months ago    Home Medications:  Allergies as of 08/09/2019      Reactions   Other    [place] nuts - break out in hive throat swelling    Sulfa Antibiotics Rash    Vancomycin Rash      Medication List       Accurate as of August 09, 2019 10:21 AM. If you have any questions, ask your nurse or doctor.        betamethasone valerate lotion 0.1 % Commonly known as: VALISONE APPLY TO THE AFFECTED AREA(S) TWICE DAILY   carvedilol 12.5 MG tablet Commonly known as: COREG Take 18.75 mg by mouth 2 (two) times daily.   clotrimazole-betamethasone cream Commonly known as: LOTRISONE Apply 1 application topically 2 (two) times daily.   Fish Oil 1200 MG Caps Take 1 capsule by mouth 2 (two) times daily before a meal.   fluticasone 0.05 % cream Commonly known as: CUTIVATE APPLY TO THE AFFECTED AREA(S) UP TO TWICE DAILY AS NEEDED - mix with ketoconazole cream   furosemide 20 MG tablet Commonly known as: LASIX Take 1 tablet (20 mg total) by mouth daily as needed (swelling).   irbesartan 300 MG tablet Commonly known as: AVAPRO TAKE 1 TABLET BY MOUTH EVERY DAY   ketoconazole 2 % cream Commonly known as: NIZORAL APPLY TO THE AFFECTED AREA(S) UP TO TWICE DAILY AS NEEDED - mix with fluticasone cream   multivitamin tablet Take 1 tablet by mouth daily.   nystatin powder Commonly known as: MYCOSTATIN/NYSTOP SMARTSIG:3 Gram(s) Topical Twice Daily   pantoprazole 40 MG tablet Commonly known as: PROTONIX Take 1 tablet (40 mg total) by mouth 2 (two) times daily before a meal.   potassium chloride SA 20 MEQ tablet Commonly known as: [place]-Con M20 Take 1 tablet (20 mEq total) by mouth daily.   PROBIOTIC PO Take 1 capsule by mouth daily.   rosuvastatin 20 MG tablet Commonly known as: CRESTOR Take 1 tablet (20 mg total) by mouth daily.   Sutab 4098-119-147 MG Tabs Generic drug: Sodium Sulfate-Mag Sulfate-KCl TAKE ONE KIT BY MOUTH ONCE AS A SINGLE DOSE -       Allergies:  Allergies  Allergen Reactions  . Other     [place] nuts - break out in hive throat swelling   . Sulfa Antibiotics Rash  . Vancomycin Rash    Family History: Family  History  Problem Relation Age of Onset  . Cancer Mother   . Heart attack Father     Social History:  reports that he has never smoked. He has never used smokeless tobacco. He reports current alcohol use of about 5.0 standard drinks of alcohol per week. He reports that he does not use drugs.  ROS: All other review of systems were reviewed and are negative except what is noted above in HPI  Physical Exam: BP 138/83   Pulse 69   Temp (!) 97.5 F (36.4 C)   Ht _0  (1.753 m)   Wt 215 lb (97.5 kg)   BMI 31.75 kg/m   Constitutional:  Alert and oriented, No acute  distress. HEENT: [place] AT, moist mucus membranes.  Trachea midline, no masses. Cardiovascular: No clubbing, cyanosis, or edema. Respiratory: Normal respiratory effort, no increased work of breathing. GI: Abdomen is soft, nontender, nondistended, no abdominal masses GU: No CVA tenderness. Circumcised phallus. No masses/lesions on penis, testis, scrotum. Prostate 40g smooth no nodules no induration.  Lymph: No cervical or inguinal lymphadenopathy. Skin: No rashes, bruises or suspicious lesions. Neurologic: Grossly intact, no focal deficits, moving all 4 extremities. Psychiatric: Normal mood and affect.  Laboratory Data: Lab Results  Component Value Date   WBC 6.7 03/20/2016   HGB 14.9 03/20/2016   HCT 43.1 03/20/2016   MCV 92.7 03/20/2016   PLT 259 03/20/2016    Lab Results  Component Value Date   CREATININE 1.06 06/07/2019    No results found for: PSA  No results found for: TESTOSTERONE  No results found for: HGBA1C  Urinalysis No results found for: COLORURINE, APPEARANCEUR, LABSPEC, PHURINE, GLUCOSEU, HGBUR, BILIRUBINUR, KETONESUR, PROTEINUR, UROBILINOGEN, NITRITE, LEUKOCYTESUR  No results found for: LABMICR, [place], RBCUA, LABEPIT, MUCUS, BACTERIA  Pertinent Imaging:  No results found for this or any previous visit.  No results found for this or any previous visit.  No results found for this or any  previous visit.  No results found for this or any previous visit.  No results found for this or any previous visit.  No results found for this or any previous visit.  No results found for this or any previous visit.  No results found for this or any previous visit.   Assessment & Plan:    1. Post-void dribbling -Continue doubel voiding  2. Pelvic and perineal pain/balanits Clotrimazole prn RTC 1 year   No follow-ups on file.  Nicolette Bang, MD  [facility] [place]

## 2019-08-09 NOTE — Progress Notes (Signed)
Urological Symptom Review  Patient is experiencing the following symptoms: Frequent urination Get up at night to urinate Leakage of urine   Review of Systems  Gastrointestinal (upper)  : Negative for upper GI symptoms  Gastrointestinal (lower) : Negative for lower GI symptoms  Constitutional : Negative for symptoms  Skin: Negative for skin symptoms  Eyes: Negative for eye symptoms  Ear/Nose/Throat : Negative for Ear/Nose/Throat symptoms  Hematologic/Lymphatic: Negative for Hematologic/Lymphatic symptoms  Cardiovascular : Leg swelling  Respiratory : Shortness of breath  Endocrine: Negative for endocrine symptoms  Musculoskeletal: Negative for musculoskeletal symptoms  Neurological: Negative for neurological symptoms  Psychologic: Negative for psychiatric symptoms

## 2019-08-25 DIAGNOSIS — I10 Essential (primary) hypertension: Secondary | ICD-10-CM | POA: Diagnosis not present

## 2019-09-05 ENCOUNTER — Encounter: Payer: Self-pay | Admitting: Urology

## 2019-09-05 NOTE — Patient Instructions (Signed)

## 2019-09-20 DIAGNOSIS — I10 Essential (primary) hypertension: Secondary | ICD-10-CM | POA: Diagnosis not present

## 2019-09-20 DIAGNOSIS — E7800 Pure hypercholesterolemia, unspecified: Secondary | ICD-10-CM | POA: Diagnosis not present

## 2019-09-26 DIAGNOSIS — I10 Essential (primary) hypertension: Secondary | ICD-10-CM | POA: Diagnosis not present

## 2019-09-29 ENCOUNTER — Encounter: Payer: Self-pay | Admitting: Pulmonary Disease

## 2019-09-29 ENCOUNTER — Other Ambulatory Visit: Payer: Self-pay

## 2019-09-29 ENCOUNTER — Ambulatory Visit: Payer: Medicare Other | Admitting: Pulmonary Disease

## 2019-09-29 DIAGNOSIS — J8410 Pulmonary fibrosis, unspecified: Secondary | ICD-10-CM | POA: Insufficient documentation

## 2019-09-29 DIAGNOSIS — J849 Interstitial pulmonary disease, unspecified: Secondary | ICD-10-CM

## 2019-09-29 DIAGNOSIS — R0600 Dyspnea, unspecified: Secondary | ICD-10-CM | POA: Diagnosis not present

## 2019-09-29 DIAGNOSIS — R0609 Other forms of dyspnea: Secondary | ICD-10-CM

## 2019-09-29 NOTE — Addendum Note (Signed)
Addended by: Merrilee Seashore on: 09/29/2019 12:26 PM   Modules accepted: Orders

## 2019-09-29 NOTE — Assessment & Plan Note (Signed)
Differential diagnosis here includes ILD or moderate aortic stenosis or both If allergy is not significant and then may have to relook at aortic stenosis especially during exercise

## 2019-09-29 NOTE — Patient Instructions (Signed)
Ambulatory saturations You may have pulmonary fibrosis. Proceed with high-resolution CT chest Schedule PFTs  Based on above, we will decide about blood work

## 2019-09-29 NOTE — Assessment & Plan Note (Addendum)
He does have groundglass infiltrates and some septal interlobular thickening noted on cardiac CT. We will get HRCT to clarify, favor IPF in this age group.  He has no stigmata of collagen vascular disease We will also obtain PFTs and compare with previous spirometry to see if restriction has gotten worse Right hemidiaphragm is elevated chronically and dates back to 2011 so I do not think this is the main issue

## 2019-09-29 NOTE — Progress Notes (Signed)
Subjective:    Patient ID: Shane Osborne, male    DOB: [birth year], 79 y.o.   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  This has been ongoing for about a year. He gets dyspneic on walking back up his driveway which is an incline or even vacuuming around the house.  He reports an a.m. cough with minimal clear sputum production but denies wheezing He has occasional pedal edema for which she has been started on Lasix. He underwent cardiac evaluation including echo and low risk nuclear stress test.  He underwent coronary CT which showed coronary calcification and aortic atherosclerosis, this also showed groundglass infiltrates and septal thickening and he was referred to [place]  He does not report any problems with arthritis or skin rash or photosensitivity I note pulmonary evaluation in 2018 when ACE inhibitor was stopped, spirometry was normal  Ambulatory saturation -stayed at 96%, heart rate increased appropriately  Significant tests/ events reviewed Echo 04/2019 normal LVEF, moderate aortic stenosis, peak gradient 29, elevated RVSP, mild MR  Cardiac CT 05/2019 >> Patchy areas of ground-glass attenuation and mild septal thickening noted in the visualized lungs Aortic atherosclerosis. Elevation of the right hemidiaphragm  Elevated L HD since 02/13/09 CTa chest  -Cath 03/23/16 and LVEDP 20      - Spirometry 05/19/2016  FEV1 2.05 (89%)  Ratio 76 with classic upper airway truncation pattern    Past Medical History:  Diagnosis Date  . Asthma    Childhood  . GERD (gastroesophageal reflux disease)   . Hyperlipidemia   . Hypertension      psh  Allergies  Allergen Reactions  . Other     [place] nuts - break out in hive throat swelling   . Sulfa Antibiotics Rash  . Vancomycin Rash    Social History   Socioeconomic History  . Marital status: Married    Spouse name: Not on file  . Number of children: Not  on file  . Years of education: Not on file  . Highest education level: Not on file  Occupational History  . Not on file  Tobacco Use  . Smoking status: Never Smoker  . Smokeless tobacco: Never Used  Substance and Sexual Activity  . Alcohol use: Yes    Alcohol/week: 5.0 standard drinks    Types: 5 Shots of liquor per week    Comment: 2 oz in the afternoon  . Drug use: No  . Sexual activity: Not on file  Other Topics Concern  . Not on file  Social History Narrative  . Not on file   Social Determinants of Health   Financial Resource Strain:   . Difficulty of Paying Living Expenses: Not on file  Food Insecurity:   . Worried About Charity fundraiser in the Last Year: Not on file  . Ran Out of Food in the Last Year: Not on file  Transportation Needs:   . Lack of Transportation (Medical): Not on file  . Lack of Transportation (Non-Medical): Not on file  Physical Activity:   . Days of Exercise per Week: Not on file  . Minutes of Exercise per Session: Not on file  Stress:   . Feeling of Stress : Not on file  Social Connections:   . Frequency of Communication with Friends and Family: Not on file  . Frequency of Social Gatherings with Friends and Family: Not on file  . Attends Religious Services: Not on  file  . Active Member of Clubs or Organizations: Not on file  . Attends Archivist Meetings: Not on file  . Marital Status: Not on file  Intimate Partner Violence:   . Fear of Current or Ex-Partner: Not on file  . Emotionally Abused: Not on file  . Physically Abused: Not on file  . Sexually Abused: Not on file     Family History  Problem Relation Age of Onset  . Cancer Mother   . Heart attack Father     Review of Systems     Constitutional: negative for anorexia, fevers and sweats  Eyes: negative for irritation, redness and visual disturbance  Ears, nose, mouth, throat, and face: negative for earaches, epistaxis, nasal congestion and sore throat   Respiratory: negative for , sputum and wheezing  Cardiovascular: negative for chest pain,  orthopnea, palpitations and syncope  Gastrointestinal: negative for abdominal pain, constipation, diarrhea, melena, nausea and vomiting  Genitourinary:negative for dysuria, frequency and hematuria  Hematologic/lymphatic: negative for bleeding, easy bruising and lymphadenopathy  Musculoskeletal:negative for arthralgias, muscle weakness and stiff joints  Neurological: negative for coordination problems, gait problems, headaches and weakness  Endocrine: negative for diabetic symptoms including polydipsia, polyuria and weight loss  Objective:   Physical Exam  Gen. Pleasant, well-nourished, in no distress, normal affect ENT - no pallor,icterus, no post nasal drip Neck: No JVD, no thyromegaly, no carotid bruits Lungs: no use of accessory muscles, no dullness to percussion, clear without rales or rhonchi  Cardiovascular: Rhythm regular, heart sounds  normal, no murmurs or gallops, no peripheral edema Abdomen: soft and non-tender, no hepatosplenomegaly, BS normal. Musculoskeletal: No deformities, no cyanosis or clubbing Neuro:  alert, non focal       Assessment & Plan:

## 2019-10-04 ENCOUNTER — Other Ambulatory Visit: Payer: Self-pay | Admitting: Family Medicine

## 2019-10-23 ENCOUNTER — Ambulatory Visit (HOSPITAL_COMMUNITY)
Admission: RE | Admit: 2019-10-23 | Discharge: 2019-10-23 | Disposition: A | Discharge status: Home / Self Care | Payer: Medicare Other | Source: Ambulatory Visit | Attending: Pulmonary Disease | Admitting: Pulmonary Disease

## 2019-10-23 ENCOUNTER — Other Ambulatory Visit: Payer: Self-pay

## 2019-10-23 DIAGNOSIS — R0600 Dyspnea, unspecified: Secondary | ICD-10-CM | POA: Diagnosis not present

## 2019-10-23 DIAGNOSIS — R0609 Other forms of dyspnea: Secondary | ICD-10-CM

## 2019-10-23 DIAGNOSIS — R0602 Shortness of breath: Secondary | ICD-10-CM | POA: Diagnosis not present

## 2019-10-23 DIAGNOSIS — J849 Interstitial pulmonary disease, unspecified: Secondary | ICD-10-CM | POA: Diagnosis not present

## 2019-10-24 NOTE — Progress Notes (Signed)
Called and spoke to patient about CT results per Dr Elsworth Soho. Made patient aware that Dr Elsworth Soho routed results to Cardiology NP and patient may receive contact from cardiology. Patient confirmed having PFT scheduled later in October. All questions answered and patient expressed full understanding. Nothing further needed at this time.

## 2019-10-26 DIAGNOSIS — E7800 Pure hypercholesterolemia, unspecified: Secondary | ICD-10-CM | POA: Diagnosis not present

## 2019-10-26 DIAGNOSIS — I10 Essential (primary) hypertension: Secondary | ICD-10-CM | POA: Diagnosis not present

## 2019-10-30 ENCOUNTER — Other Ambulatory Visit: Payer: Self-pay

## 2019-10-30 DIAGNOSIS — R0602 Shortness of breath: Secondary | ICD-10-CM

## 2019-11-13 DIAGNOSIS — E876 Hypokalemia: Secondary | ICD-10-CM | POA: Diagnosis not present

## 2019-11-13 DIAGNOSIS — I10 Essential (primary) hypertension: Secondary | ICD-10-CM | POA: Diagnosis not present

## 2019-11-13 DIAGNOSIS — J849 Interstitial pulmonary disease, unspecified: Secondary | ICD-10-CM | POA: Diagnosis not present

## 2019-11-13 DIAGNOSIS — Z299 Encounter for prophylactic measures, unspecified: Secondary | ICD-10-CM | POA: Diagnosis not present

## 2019-11-13 DIAGNOSIS — I700 Atherosclerosis of aorta: Secondary | ICD-10-CM | POA: Diagnosis not present

## 2019-11-17 ENCOUNTER — Telehealth: Payer: Self-pay | Admitting: Cardiology

## 2019-11-17 ENCOUNTER — Other Ambulatory Visit (HOSPITAL_COMMUNITY)
Admission: RE | Admit: 2019-11-17 | Discharge: 2019-11-17 | Disposition: A | Discharge status: Home / Self Care | Payer: Medicare Other | Source: Ambulatory Visit | Attending: Pulmonary Disease | Admitting: Pulmonary Disease

## 2019-11-17 ENCOUNTER — Other Ambulatory Visit: Payer: Self-pay

## 2019-11-17 DIAGNOSIS — Z20822 Contact with and (suspected) exposure to covid-19: Secondary | ICD-10-CM | POA: Insufficient documentation

## 2019-11-17 DIAGNOSIS — Z01812 Encounter for preprocedural laboratory examination: Secondary | ICD-10-CM | POA: Insufficient documentation

## 2019-11-17 NOTE — Telephone Encounter (Signed)
Pre-cert Verification for the following procedure    ECHO   DATE: 11/22/2019  LOCATION: [facility]

## 2019-11-18 LAB — SARS CORONAVIRUS 2 (TAT 6-24 HRS): SARS Coronavirus 2: NEGATIVE

## 2019-11-20 ENCOUNTER — Ambulatory Visit: Payer: Medicare Other | Admitting: Family Medicine

## 2019-11-21 ENCOUNTER — Ambulatory Visit (HOSPITAL_COMMUNITY)
Admission: RE | Admit: 2019-11-21 | Discharge: 2019-11-21 | Disposition: A | Discharge status: Home / Self Care | Payer: Medicare Other | Source: Ambulatory Visit | Attending: Pulmonary Disease | Admitting: Pulmonary Disease

## 2019-11-21 ENCOUNTER — Encounter: Payer: Self-pay | Admitting: Pulmonary Disease

## 2019-11-21 ENCOUNTER — Other Ambulatory Visit: Payer: Self-pay

## 2019-11-21 ENCOUNTER — Ambulatory Visit: Payer: Medicare Other | Admitting: Pulmonary Disease

## 2019-11-21 VITALS — BP 126/76 | HR 74 | Temp 97.4°F | Ht 69.0 in | Wt 209.8 lb

## 2019-11-21 DIAGNOSIS — J849 Interstitial pulmonary disease, unspecified: Secondary | ICD-10-CM | POA: Insufficient documentation

## 2019-11-21 DIAGNOSIS — R0609 Other forms of dyspnea: Secondary | ICD-10-CM

## 2019-11-21 DIAGNOSIS — R0600 Dyspnea, unspecified: Secondary | ICD-10-CM | POA: Diagnosis not present

## 2019-11-21 LAB — PULMONARY FUNCTION TEST
DL/VA % pred: 126 %
DL/VA: 4.95 ml/min/mmHg/L
DLCO unc % pred: 96 %
DLCO unc: 22.96 ml/min/mmHg
FEF 25-75 Post: 2.5 L/sec
FEF 25-75 Pre: 2.24 L/sec
FEF2575-%Change-Post: 11 %
FEF2575-%Pred-Post: 129 %
FEF2575-%Pred-Pre: 115 %
FEV1-%Change-Post: 11 %
FEV1-%Pred-Post: 81 %
FEV1-%Pred-Pre: 73 %
FEV1-Post: 2.28 L
FEV1-Pre: 2.05 L
FEV1FVC-%Change-Post: 0 %
FEV1FVC-%Pred-Pre: 117 %
FEV6-%Change-Post: 11 %
FEV6-%Pred-Post: 74 %
FEV6-%Pred-Pre: 66 %
FEV6-Post: 2.7 L
FEV6-Pre: 2.41 L
FEV6FVC-%Pred-Post: 107 %
FEV6FVC-%Pred-Pre: 107 %
FVC-%Change-Post: 11 %
FVC-%Pred-Post: 69 %
FVC-%Pred-Pre: 62 %
FVC-Post: 2.7 L
FVC-Pre: 2.42 L
Post FEV1/FVC ratio: 84 %
Post FEV6/FVC ratio: 100 %
Pre FEV1/FVC ratio: 85 %
Pre FEV6/FVC Ratio: 100 %
RV % pred: 89 %
RV: 2.32 L
TLC % pred: 75 %
TLC: 5.17 L

## 2019-11-21 MED ORDER — ALBUTEROL SULFATE (2.5 MG/3ML) 0.083% IN NEBU
2.5000 mg | INHALATION_SOLUTION | Freq: Once | RESPIRATORY_TRACT | Status: AC
  Administered 2019-11-21: 2.5 mg via RESPIRATORY_TRACT

## 2019-11-21 MED ORDER — ALBUTEROL SULFATE HFA 108 (90 BASE) MCG/ACT IN AERS
2.0000 | INHALATION_SPRAY | Freq: Four times a day (QID) | RESPIRATORY_TRACT | 6 refills | Status: AC | PRN
Start: 2019-11-21 — End: ?

## 2019-11-21 NOTE — Assessment & Plan Note (Signed)
No evidence of significant ILD on HRCT. PFTs do show restriction but diffusion is normal, he does not have significant kyphoscoliosis or other explanation for extraparenchymal restriction. To my review HRCT shows mild bibasilar bronchiectasis and this may explain the finding of dry crackles on exam

## 2019-11-21 NOTE — Progress Notes (Signed)
   Subjective:    Patient ID: Shane Osborne, male    DOB: [birth year], 79 y.o.   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 Follow-up    follow up after PFT, shortness of breath with activity   After his initial evaluation, we suggested proceeding with PFTs and HRCT today which we reviewed He continues to have shortness of breath while walking up an incline, does okay on level ground  Significant tests/ events reviewed PFTs 10/2019 >> no airway obstruction, ratio 85, mild restriction with TLC 75%, 11% bronchodilator response, normal diffusion capacity   HRCT 09/2019 >> no ILD, air trapping, RML nodules 32mm stable from 05/2019 -Mild bronchiectasis at bases to my review  Echo 04/2019 normal LVEF, moderate aortic stenosis, peak gradient 29, elevated RVSP, mild MR  Cardiac CT 05/2019 >> Patchy areas of ground-glass attenuation and mild septal thickening noted in the visualized lungs Aortic atherosclerosis. Elevation of the right hemidiaphragm  Elevated L HD since 02/13/09 CTa chest  -Cath 03/23/16 and LVEDP 20    - Spirometry 05/19/2016 FEV1 2.05 (89%) Ratio 76 with classic upper airway truncation pattern  Review of Systems neg for any significant sore throat, dysphagia, itching, sneezing, nasal congestion or excess/ purulent secretions, fever, chills, sweats, unintended wt loss, pleuritic or exertional cp, hempoptysis, orthopnea pnd or change in chronic leg swelling. Also denies presyncope, palpitations, heartburn, abdominal pain, nausea, vomiting, diarrhea or change in bowel or urinary habits, dysuria,hematuria, rash, arthralgias, visual complaints, headache, numbness weakness or ataxia.     Objective:   Physical Exam  Gen. Pleasant, well-nourished, in no distress ENT - no thrush,  no pallor/icterus,no post nasal drip Neck: No JVD, no thyromegaly, no carotid bruits Lungs: no use of accessory muscles, no dullness to percussion, fine basal rales no rhonchi  Cardiovascular: Rhythm regular, heart sounds  normal, no murmurs or gallops, no peripheral edema Musculoskeletal: No deformities, no cyanosis or clubbing        Assessment & Plan:

## 2019-11-21 NOTE — Assessment & Plan Note (Signed)
Overall I am not convinced of a pulmonary cause for his dyspnea.  I have discussed with cardiology about reassessing aortic valve and he is scheduled for repeat echocardiogram tomorrow. We will meantime provide him with an albuterol MDI prescription which he can use on an as-needed basis given bronchodilator response on his PFTs.  He really does not have a history to suggest asthma

## 2019-11-21 NOTE — Patient Instructions (Signed)
No evidence of lung fibrosis You have 2mm nodules in the RT lung  Repeat HRCT in 1 year Trial of albuterol MDI 2 puffs as needed before exertion  Cardiology to look at aortic valve

## 2019-11-22 ENCOUNTER — Telehealth: Payer: Self-pay | Admitting: *Deleted

## 2019-11-22 ENCOUNTER — Other Ambulatory Visit: Payer: Self-pay

## 2019-11-22 ENCOUNTER — Ambulatory Visit (HOSPITAL_COMMUNITY)
Admission: RE | Admit: 2019-11-22 | Discharge: 2019-11-22 | Disposition: A | Discharge status: Home / Self Care | Payer: Medicare Other | Source: Ambulatory Visit | Attending: Cardiology | Admitting: Cardiology

## 2019-11-22 DIAGNOSIS — I350 Nonrheumatic aortic (valve) stenosis: Secondary | ICD-10-CM

## 2019-11-22 DIAGNOSIS — R0602 Shortness of breath: Secondary | ICD-10-CM | POA: Insufficient documentation

## 2019-11-22 LAB — ECHOCARDIOGRAM COMPLETE
AR max vel: 1.31 cm2
AV Area VTI: 1.04 cm2
AV Area mean vel: 1.18 cm2
AV Mean grad: 14.7 mmHg
AV Peak grad: 27.9 mmHg
Ao pk vel: 2.64 m/s
Area-P 1/2: 2.87 cm2
S' Lateral: 3.05 cm

## 2019-11-22 NOTE — Progress Notes (Signed)
*  PRELIMINARY RESULTS* Echocardiogram 2D Echocardiogram has been performed.  Shane Osborne 11/22/2019, 9:31 AM

## 2019-11-22 NOTE — Telephone Encounter (Signed)
-----   Message from Satira Sark, MD sent at 11/22/2019 12:11 PM EDT ----- Results reviewed.  Former patient of Dr. Bronson Ing, last seen by [name].  Also followed by pulmonary with request for follow-up assessment of aortic stenosis.  Study shows normal LVEF of 55 to 60%.  The aortic valve is moderately calcified and shows probable low gradient moderate aortic stenosis based on calculated valve area and also dimensionless index.  This needs to be followed, most likely with a repeat echocardiogram in 6 months.

## 2019-11-23 NOTE — Progress Notes (Signed)
Cardiology Office Note  Date: 11/24/2019   ID: [name], [name] 1940-10-23, MRN [id number]  PCP:  Glenda Chroman, MD  Cardiologist:  No primary care provider on file. Electrophysiologist:  None   Chief Complaint: Follow-up shortness of breath  History of Present Illness: Shane Osborne is a 79 y.o. male with a history of nonobstructive CAD by catheterization 2018, mild to moderate aortic stenosis, HTN, HLD.  Saw Dr. Bronson Ing on 05/25/2019 complaining of increasing exertional dyspnea from previous visit in October 2020.  Stating he had no energy.  Previous echocardiogram showed mild to moderate aortic stenosis July 2020.  Repeat echocardiogram was ordered for increased exertional dyspnea.  Patient was having remote blood pressures checked by his PCPs office and blood pressure had been well controlled but was elevated on that particular visit at 156/78.  He was currently on carvedilol 18.75 mg p.o. twice daily.  He was also taking Avapro 300 mg every morning.  No adjustments were made to antihypertensive medications.  Mitral regurgitation was moderate in severity by echo and 08/15/2018.  Echocardiogram to be repeated.  For dyspnea on exertion coronary CT angiography was ordered to check for developing obstructive coronary artery disease since last catheterization in February 2018.  Nuclear stress test was normal.  CT of the chest showed appearance of the lungs could suggest early or mild interstitial lung disease.  Outpatient referral to pulmonology to further evaluation was recommended.  Also follow-up high resolution chest CT should also be considered  At last visit he stated nothing had changed in his dyspnea on exertion.  He stated as long as he was on level ground and not overly exerting himself its okay.  Stated when ascending stairs or doing more than normal activity, the shortness of breath became worse.  We discussed the results of his echocardiogram and his recent CT of the chest, as  well as stress test.  He denied any anginal symptoms, orthostatic symptoms, palpitations or arrhythmias, CVA or TIA-like symptoms, PND, orthopnea.  Stated he had noticed some swelling in his lower legs over the last month.    He is here for follow-up status post recent echocardiogram results.  Continues to complain of some mild dyspnea on exertion.  Recently saw pulmonology who saw no evidence of significant lung issues except for some benign lung nodules.  He requested aortic valve to be reassessed.  Echocardiogram 11/22/2019 showed EF 55 to 60%.  Aortic valve moderately calcified low gradient moderate aortic stenosis based on calculated valve area and dimensionless index.  Otherwise he denies any other symptoms.  Blood pressure is well controlled on current medications.  Heart rate today is 59 sinus bradycardia with marked sinus arrhythmia occasional PVC on EKG.   Past Medical History:  Diagnosis Date  . Asthma    Childhood  . GERD (gastroesophageal reflux disease)   . Hyperlipidemia   . Hypertension     Past Surgical History:  Procedure Laterality Date  . BIOPSY  07/27/2019   Procedure: BIOPSY;  Surgeon: Rogene Houston, MD;  [facility]: [facility];  Service: Endoscopy;;  . COLONOSCOPY  10/01/2010   Procedure: COLONOSCOPY;  Surgeon: Rogene Houston, MD;  [facility]: [facility];  Service: Endoscopy;  Laterality: N/A;  . COLONOSCOPY N/A 09/01/2013   Procedure: COLONOSCOPY;  Surgeon: Rogene Houston, MD;  [facility]: [facility];  Service: Endoscopy;  Laterality: N/A;  940  . COLONOSCOPY N/A 07/27/2019   Procedure: COLONOSCOPY;  Surgeon: Rogene Houston, MD;  [facility]: [facility];  Service: Endoscopy;  Laterality: N/A;  1030  . ESOPHAGOGASTRODUODENOSCOPY    . ESOPHAGOGASTRODUODENOSCOPY N/A 04/11/2014   Procedure: ESOPHAGOGASTRODUODENOSCOPY (EGD);  Surgeon: Rogene Houston, MD;  [facility]: [facility];  Service: Endoscopy;  Laterality: N/A;  1200 - moved to 12:55 - Ann to notify pt  .  ESOPHAGOGASTRODUODENOSCOPY N/A 07/27/2019   Procedure: ESOPHAGOGASTRODUODENOSCOPY (EGD);  Surgeon: Rogene Houston, MD;  [facility]: [facility];  Service: Endoscopy;  Laterality: N/A;  . LEFT HEART CATH AND CORONARY ANGIOGRAPHY N/A 03/23/2016   Procedure: Left Heart Cath and Coronary Angiography;  Surgeon: Sherren Mocha, MD;  Location: [facility];  Service: Cardiovascular;  Laterality: N/A;  . POLYPECTOMY  07/27/2019   Procedure: POLYPECTOMY;  Surgeon: Rogene Houston, MD;  [facility]: [facility];  Service: Endoscopy;;  . right leg  20 years ago   X 8 secondary to fracture  . rt inguinal      hernia repair 2 months ago    Current Outpatient Medications  Medication Sig Dispense Refill  . albuterol (VENTOLIN HFA) 108 (90 Base) MCG/ACT inhaler Inhale 2 puffs into the lungs every 6 (six) hours as needed for wheezing or shortness of breath. 8 g 6  . betamethasone valerate lotion (VALISONE) 0.1 % APPLY TO THE AFFECTED AREA(S) TWICE DAILY    . carvedilol (COREG) 12.5 MG tablet Take 18.75 mg by mouth 2 (two) times daily.    . clotrimazole-betamethasone (LOTRISONE) cream Apply 1 application topically 2 (two) times daily.    . fluticasone (CUTIVATE) 0.05 % cream APPLY TO THE AFFECTED AREA(S) UP TO TWICE DAILY AS NEEDED - mix with ketoconazole cream    . furosemide (LASIX) 20 MG tablet TAKE 1 TABLET BY MOUTH EVERY DAY AS NEEDED FOR SWELLING 90 tablet 1  . irbesartan (AVAPRO) 300 MG tablet TAKE 1 TABLET BY MOUTH EVERY DAY (Patient taking differently: Take 300 mg by mouth daily. ) 30 tablet 6  . ketoconazole (NIZORAL) 2 % cream APPLY TO THE AFFECTED AREA(S) UP TO TWICE DAILY AS NEEDED - mix with fluticasone cream    . Multiple Vitamin (MULTIVITAMIN) tablet Take 1 tablet by mouth daily.    Marland Kitchen nystatin (MYCOSTATIN/NYSTOP) powder SMARTSIG:3 Gram(s) Topical Twice Daily    . Omega-3 Fatty Acids (FISH OIL) 1200 MG CAPS Take 1 capsule by mouth 2 (two) times daily before a meal.     . pantoprazole  (PROTONIX) 40 MG tablet Take 1 tablet (40 mg total) by mouth 2 (two) times daily before a meal. 60 tablet 2  . Probiotic Product (PROBIOTIC PO) Take 1 capsule by mouth daily.     . potassium chloride SA (KLOR-CON M20) 20 MEQ tablet Take 1 tablet (20 mEq total) by mouth daily. (Patient not taking: Reported on 11/21/2019) 90 tablet 3  . rosuvastatin (CRESTOR) 20 MG tablet Take 1 tablet (20 mg total) by mouth daily. 30 tablet 6   No current facility-administered medications for this visit.   Allergies:  Other, Sulfa antibiotics, and Vancomycin   Social History: The patient  reports that he has never smoked. He has never used smokeless tobacco. He reports current alcohol use of about 5.0 standard drinks of alcohol per week. He reports that he does not use drugs.   Family History: The patient's family history includes Cancer in his mother; Heart attack in his father.   ROS:  Please see the history of present illness. Otherwise, complete review of systems is positive for none.  All other  systems are reviewed and negative.   Physical Exam: VS:  BP 124/72   Pulse (!) 59   Ht 5\' 9"  (1.753 m)   Wt 212 lb (96.2 kg)   SpO2 98%   BMI 31.31 kg/m , BMI Body mass index is 31.31 kg/m.  Wt Readings from Last 3 Encounters:  11/24/19 212 lb (96.2 kg)  11/21/19 209 lb 12.8 oz (95.2 kg)  09/29/19 212 lb (96.2 kg)    General: Patient appears comfortable at rest. Neck: Supple, no elevated JVP or carotid bruits, no thyromegaly. Lungs: Clear to auscultation, nonlabored breathing at rest. Cardiac: Regular rate and rhythm, no S3 or significant systolic murmur, no pericardial rub. Extremities: No pitting edema, distal pulses 2+. Skin: Warm and dry. Musculoskeletal: No kyphosis. Neuropsychiatric: Alert and oriented x3, affect grossly appropriate.  ECG: 11/24/2019, EKG sinus bradycardia with marked sinus arrhythmia with occasional premature ventricular complexes rate of 59.  Recent Labwork: 06/07/2019: BUN  16; Creatinine, Ser 1.06; Potassium 4.3; Sodium 139  No results found for: CHOL, TRIG, HDL, CHOLHDL, VLDL, LDLCALC, LDLDIRECT  Other Studies Reviewed Today:  Echocardiogram 11/22/2019 1. Left ventricular ejection fraction, by estimation, is 55 to 60%. The left ventricle has normal function. The left ventricle has no regional wall motion abnormalities. Left ventricular diastolic parameters are consistent with Grade I diastolic dysfunction (impaired relaxation). 2. Right ventricular systolic function is normal. The right ventricular size is normal. 3. The mitral valve is normal in structure. No evidence of mitral valve regurgitation. No evidence of mitral stenosis. 4. The aortic valve has an indeterminant number of cusps. There is moderate calcification of the aortic valve. There is moderate thickening of the aortic valve. Aortic valve regurgitation is not visualized. Mild to moderate aortic valve stenosis.   Nuclear stress test 07/07/2019 Study Result  Narrative & Impression    The study is normal.  This is a low risk study.  The left ventricular ejection fraction is normal (55-65%).  There was no ST segment deviation noted during stress.   Diaphragmatic attenuation no ischemia Low risk study EF 59%     Echocardiogram 05/25/2019 IMPRESSIONS  1. Left ventricular ejection fraction, by estimation, is 60 to 65%. The  left ventricle has normal function. The left ventricle has no regional  wall motion abnormalities. There is mild left ventricular hypertrophy of  the posterior segment. Left  ventricular diastolic parameters are consistent with Grade I diastolic  dysfunction (impaired relaxation). Elevated left ventricular end-diastolic  pressure.  2. Right ventricular systolic function is normal. The right ventricular  size is normal. There is moderately elevated pulmonary artery systolic  pressure.  3. The mitral valve is degenerative. Mild mitral valve regurgitation.    4. Morphologically, aortic stenosis appeared to be in the moderate range.  The aortic valve is tricuspid. Aortic valve regurgitation is trivial.  Moderate aortic valve stenosis. Aortic valve area, by VTI measures 0.98  cm. Aortic valve mean gradient  measures 16.8 mmHg. Aortic valve Vmax measures 2.70 m/s.   Comparison(s): Echocardiogram done 08/24/18 showed an EF of 65% with mild to moderate AS and an AV Peak Grad of 28 mmHg.    Coronary CT 06/08/2019 IMPRESSION: 1. The appearance of the lungs could suggest early or mild interstitial lung disease. Outpatient referral to Pulmonology for further evaluation is recommended. Follow-up high-resolution chest CT should also be considered. 2. Aortic Atherosclerosis (ICD10-I70.0).   Assessment and Plan:   1. DOE (dyspnea on exertion)/abnormal CT the chest/possible interstitial lung disease   Repeat CT  10/23/2019: No definitive evidence of interstitial lung disease, air trapping indicative of small airway disease.  RML pulmonary nodules stable from 06/08/2019.  Dr. Elsworth Soho suggested we recheck echo to evaluate aortic valve.  States dyspnea only occurs when walking up inclines.  States she does okay on level ground.  2. Aortic valve stenosis, etiology of cardiac valve disease unspecified Echocardiogram 05/25/2019 showed EF 60 to 65%, mild LVH, grade 1 DD, moderately elevated PASP, mild MR, moderate aortic stenosis.   Echocardiogram 10/27/2021EF 55 to 60%, G1 DD, mild to moderate aortic valve stenosis.  Repeat echocardiogram in 6 months prior to follow-up.  3. Essential hypertension He is normotensive today with a blood pressure 124/72.  Continue Coreg 12.5 mg p.o. twice daily.  Continue irbesartan 300 mg daily.  4. Mixed hyperlipidemia Continue Crestor 20 mg daily.  We do not have a recent lipid panel.  We will attempt to obtain recent lipid results from PCP  5. Mitral valve insufficiency, unspecified etiology Mild MR on recent echo  05/25/2019.  6.  Lower extremity edema. At last visit patient stated he had been noticing increasing lower extremity edema over the last month.  Lasix 20 mg p.o. daily as needed for lower extremity edema was started.  Medication Adjustments/Labs and Tests Ordered: Current medicines are reviewed at length with the patient today.  Concerns regarding medicines are outlined above.   Disposition: Follow-up with Dr. Domenic Polite or APP 3 months  Signed, Levell July, NP 11/24/2019 11:11 AM    [facility] [place], [place], [place] [address] Phone: [phone]; Fax: [phone]

## 2019-11-23 NOTE — Telephone Encounter (Signed)
Patient informed. Copy sent to PCP °

## 2019-11-24 ENCOUNTER — Ambulatory Visit (INDEPENDENT_AMBULATORY_CARE_PROVIDER_SITE_OTHER): Payer: Medicare Other | Admitting: Family Medicine

## 2019-11-24 ENCOUNTER — Encounter: Payer: Self-pay | Admitting: Family Medicine

## 2019-11-24 ENCOUNTER — Other Ambulatory Visit: Payer: Self-pay

## 2019-11-24 VITALS — BP 124/72 | HR 59 | Ht 69.0 in | Wt 212.0 lb

## 2019-11-24 DIAGNOSIS — I10 Essential (primary) hypertension: Secondary | ICD-10-CM | POA: Diagnosis not present

## 2019-11-24 DIAGNOSIS — K219 Gastro-esophageal reflux disease without esophagitis: Secondary | ICD-10-CM | POA: Diagnosis not present

## 2019-11-24 DIAGNOSIS — I350 Nonrheumatic aortic (valve) stenosis: Secondary | ICD-10-CM

## 2019-11-24 DIAGNOSIS — E7800 Pure hypercholesterolemia, unspecified: Secondary | ICD-10-CM | POA: Diagnosis not present

## 2019-11-24 DIAGNOSIS — E7849 Other hyperlipidemia: Secondary | ICD-10-CM | POA: Diagnosis not present

## 2019-11-24 NOTE — Patient Instructions (Signed)
Medication Instructions:  Continue all current medications.  Labwork: none  Testing/Procedures: Your physician has requested that you have an echocardiogram. Echocardiography is a painless test that uses sound waves to create images of your heart. It provides your doctor with information about the size and shape of your heart and how well your heart's chambers and valves are working. This procedure takes approximately one hour. There are no restrictions for this procedure - DUE JUST PRIOR TO NEXT OFFICE VISIT   Follow-Up: 6 months   Any Other Special Instructions Will Be Listed Below (If Applicable).  If you need a refill on your cardiac medications before your next appointment, please call your pharmacy.  

## 2019-11-25 DIAGNOSIS — I10 Essential (primary) hypertension: Secondary | ICD-10-CM | POA: Diagnosis not present

## 2019-11-30 DIAGNOSIS — E876 Hypokalemia: Secondary | ICD-10-CM | POA: Diagnosis not present

## 2019-12-15 DIAGNOSIS — K219 Gastro-esophageal reflux disease without esophagitis: Secondary | ICD-10-CM | POA: Diagnosis not present

## 2019-12-15 DIAGNOSIS — L03115 Cellulitis of right lower limb: Secondary | ICD-10-CM | POA: Diagnosis not present

## 2019-12-15 DIAGNOSIS — I10 Essential (primary) hypertension: Secondary | ICD-10-CM | POA: Diagnosis not present

## 2019-12-15 DIAGNOSIS — Z299 Encounter for prophylactic measures, unspecified: Secondary | ICD-10-CM | POA: Diagnosis not present

## 2019-12-15 DIAGNOSIS — M109 Gout, unspecified: Secondary | ICD-10-CM | POA: Diagnosis not present

## 2019-12-26 DIAGNOSIS — I10 Essential (primary) hypertension: Secondary | ICD-10-CM | POA: Diagnosis not present

## 2020-01-12 ENCOUNTER — Other Ambulatory Visit: Payer: Self-pay | Admitting: Student

## 2020-01-25 DIAGNOSIS — E7800 Pure hypercholesterolemia, unspecified: Secondary | ICD-10-CM | POA: Diagnosis not present

## 2020-01-25 DIAGNOSIS — I10 Essential (primary) hypertension: Secondary | ICD-10-CM | POA: Diagnosis not present

## 2020-01-26 DIAGNOSIS — E7849 Other hyperlipidemia: Secondary | ICD-10-CM | POA: Diagnosis not present

## 2020-01-26 DIAGNOSIS — K219 Gastro-esophageal reflux disease without esophagitis: Secondary | ICD-10-CM | POA: Diagnosis not present

## 2020-02-26 DIAGNOSIS — K219 Gastro-esophageal reflux disease without esophagitis: Secondary | ICD-10-CM | POA: Diagnosis not present

## 2020-02-26 DIAGNOSIS — E7849 Other hyperlipidemia: Secondary | ICD-10-CM | POA: Diagnosis not present

## 2020-02-26 DIAGNOSIS — I10 Essential (primary) hypertension: Secondary | ICD-10-CM | POA: Diagnosis not present

## 2020-03-13 DIAGNOSIS — I700 Atherosclerosis of aorta: Secondary | ICD-10-CM | POA: Diagnosis not present

## 2020-03-13 DIAGNOSIS — J849 Interstitial pulmonary disease, unspecified: Secondary | ICD-10-CM | POA: Diagnosis not present

## 2020-03-13 DIAGNOSIS — Z299 Encounter for prophylactic measures, unspecified: Secondary | ICD-10-CM | POA: Diagnosis not present

## 2020-03-13 DIAGNOSIS — R519 Headache, unspecified: Secondary | ICD-10-CM | POA: Diagnosis not present

## 2020-03-13 DIAGNOSIS — I10 Essential (primary) hypertension: Secondary | ICD-10-CM | POA: Diagnosis not present

## 2020-03-25 DIAGNOSIS — I10 Essential (primary) hypertension: Secondary | ICD-10-CM | POA: Diagnosis not present

## 2020-03-28 DIAGNOSIS — I700 Atherosclerosis of aorta: Secondary | ICD-10-CM | POA: Diagnosis not present

## 2020-03-28 DIAGNOSIS — Z299 Encounter for prophylactic measures, unspecified: Secondary | ICD-10-CM | POA: Diagnosis not present

## 2020-03-28 DIAGNOSIS — W57XXXA Bitten or stung by nonvenomous insect and other nonvenomous arthropods, initial encounter: Secondary | ICD-10-CM | POA: Diagnosis not present

## 2020-03-28 DIAGNOSIS — I10 Essential (primary) hypertension: Secondary | ICD-10-CM | POA: Diagnosis not present

## 2020-03-28 DIAGNOSIS — Z789 Other specified health status: Secondary | ICD-10-CM | POA: Diagnosis not present

## 2020-03-28 DIAGNOSIS — L739 Follicular disorder, unspecified: Secondary | ICD-10-CM | POA: Diagnosis not present

## 2020-04-12 ENCOUNTER — Other Ambulatory Visit: Payer: Self-pay | Admitting: Family Medicine

## 2020-04-24 DIAGNOSIS — E1165 Type 2 diabetes mellitus with hyperglycemia: Secondary | ICD-10-CM | POA: Diagnosis not present

## 2020-04-24 DIAGNOSIS — K219 Gastro-esophageal reflux disease without esophagitis: Secondary | ICD-10-CM | POA: Diagnosis not present

## 2020-04-25 DIAGNOSIS — I10 Essential (primary) hypertension: Secondary | ICD-10-CM | POA: Diagnosis not present

## 2020-05-23 ENCOUNTER — Ambulatory Visit (INDEPENDENT_AMBULATORY_CARE_PROVIDER_SITE_OTHER): Payer: Medicare Other

## 2020-05-23 DIAGNOSIS — I350 Nonrheumatic aortic (valve) stenosis: Secondary | ICD-10-CM | POA: Diagnosis not present

## 2020-05-23 LAB — ECHOCARDIOGRAM COMPLETE
AR max vel: 1.13 cm2
AV Area VTI: 1.26 cm2
AV Area mean vel: 1.21 cm2
AV Mean grad: 14.4 mmHg
AV Peak grad: 30.5 mmHg
AV Vena cont: 0.31 cm
Ao pk vel: 2.76 m/s
Area-P 1/2: 2.37 cm2
Calc EF: 65.3 %
MV M vel: 2.7 m/s
MV Peak grad: 29.2 mmHg
P 1/2 time: 2003 msec
S' Lateral: 2.51 cm
Single Plane A2C EF: 64.5 %
Single Plane A4C EF: 63.5 %

## 2020-05-25 DIAGNOSIS — I10 Essential (primary) hypertension: Secondary | ICD-10-CM | POA: Diagnosis not present

## 2020-05-31 ENCOUNTER — Ambulatory Visit: Payer: Medicare Other | Admitting: Cardiology

## 2020-05-31 ENCOUNTER — Encounter: Payer: Self-pay | Admitting: Cardiology

## 2020-05-31 VITALS — BP 130/80 | HR 56 | Ht 69.0 in | Wt 202.2 lb

## 2020-05-31 DIAGNOSIS — I25119 Atherosclerotic heart disease of native coronary artery with unspecified angina pectoris: Secondary | ICD-10-CM | POA: Diagnosis not present

## 2020-05-31 DIAGNOSIS — I350 Nonrheumatic aortic (valve) stenosis: Secondary | ICD-10-CM

## 2020-05-31 DIAGNOSIS — I10 Essential (primary) hypertension: Secondary | ICD-10-CM

## 2020-05-31 MED ORDER — CARVEDILOL 12.5 MG PO TABS: 12.5000 mg | ORAL_TABLET | Freq: Two times a day (BID) | ORAL | 2 refills | Status: DC

## 2020-05-31 NOTE — Patient Instructions (Addendum)
Medication Instructions:   Your physician has recommended you make the following change in your medication:   Decrease carvedilol to 12.5 mg by mouth twice a day  Continue other medications the same  Labwork:  none  Testing/Procedures:  none  Follow-Up:  Your physician recommends that you schedule a follow-up appointment in: 6 months  Any Other Special Instructions Will Be Listed Below (If Applicable).  If you need a refill on your cardiac medications before your next appointment, please call your pharmacy.

## 2020-05-31 NOTE — Progress Notes (Signed)
Cardiology Office Note  Date: 05/31/2020   ID: Aqil, [place] 26-May-1940, MRN [id number]  PCP:  Glenda Chroman, MD  Cardiologist:  Rozann Lesches, MD Electrophysiologist:  None   Chief Complaint  Patient presents with  . Cardiac follow-up    History of Present Illness: Shane Osborne is an 80 y.o. male former patient of Dr. Bronson Ing now presenting to establish follow-up with me.  I reviewed his records and updated the chart.  He was last seen in October 2021 by [name].   He is here for a routine visit.  Reports dyspnea on exertion, no exertional chest pain or palpitations.  He remains active with ADLs, walks his dog regularly.  No cough or wheezing.  Recent echocardiogram showed LVEF 60 to 65% with mild diastolic dysfunction, normal RV contraction, and overall moderate calcific aortic stenosis.  We discussed these results today and plan for continued surveillance.  Still unlikely to be causing symptoms at this point.  He underwent reassuring ischemic testing in June 2021 as noted below.  I reviewed his medications and we discussed reducing Coreg to 12.5 mg twice daily in case there is any component of symptomatic bradycardia or chronotropic incompetence.  Past Medical History:  Diagnosis Date  . Aortic stenosis   . Childhood asthma   . Coronary atherosclerosis    Mild at cardiac catheterization 2018  . Essential hypertension   . GERD (gastroesophageal reflux disease)   . Hyperlipidemia     Past Surgical History:  Procedure Laterality Date  . BIOPSY  07/27/2019   Procedure: BIOPSY;  Surgeon: Rogene Houston, MD;  [facility];  Service: Endoscopy;;  . COLONOSCOPY  10/01/2010   Procedure: COLONOSCOPY;  Surgeon: Rogene Houston, MD;  [facility];  Service: Endoscopy;  Laterality: N/A;  . COLONOSCOPY N/A 09/01/2013   Procedure: COLONOSCOPY;  Surgeon: Rogene Houston, MD;  [facility];  Service: Endoscopy;  Laterality: N/A;  940  .  COLONOSCOPY N/A 07/27/2019   Procedure: COLONOSCOPY;  Surgeon: Rogene Houston, MD;  [facility];  Service: Endoscopy;  Laterality: N/A;  1030  . ESOPHAGOGASTRODUODENOSCOPY    . ESOPHAGOGASTRODUODENOSCOPY N/A 04/11/2014   Procedure: ESOPHAGOGASTRODUODENOSCOPY (EGD);  Surgeon: Rogene Houston, MD;  [facility];  Service: Endoscopy;  Laterality: N/A;  1200 - moved to 12:55 - Ann to notify pt  . ESOPHAGOGASTRODUODENOSCOPY N/A 07/27/2019   Procedure: ESOPHAGOGASTRODUODENOSCOPY (EGD);  Surgeon: Rogene Houston, MD;  [facility];  Service: Endoscopy;  Laterality: N/A;  . LEFT HEART CATH AND CORONARY ANGIOGRAPHY N/A 03/23/2016   Procedure: Left Heart Cath and Coronary Angiography;  Surgeon: Sherren Mocha, MD;  Location: [facility];  Service: Cardiovascular;  Laterality: N/A;  . POLYPECTOMY  07/27/2019   Procedure: POLYPECTOMY;  Surgeon: Rogene Houston, MD;  [facility];  Service: Endoscopy;;  . right leg  20 years ago   X 8 secondary to fracture  . rt inguinal      hernia repair 2 months ago    Current Outpatient Medications  Medication Sig Dispense Refill  . albuterol (VENTOLIN HFA) 108 (90 Base) MCG/ACT inhaler Inhale 2 puffs into the lungs every 6 (six) hours as needed for wheezing or shortness of breath. 8 g 6  . betamethasone valerate lotion (VALISONE) 0.1 % APPLY TO THE AFFECTED AREA(S) TWICE DAILY    . carvedilol (COREG) 12.5 MG tablet Take 18.75 mg by mouth  2 (two) times daily.    . clotrimazole-betamethasone (LOTRISONE) cream Apply 1 application topically 2 (two) times daily.    . fluticasone (CUTIVATE) 0.05 % cream APPLY TO THE AFFECTED AREA(S) UP TO TWICE DAILY AS NEEDED - mix with ketoconazole cream    . furosemide (LASIX) 20 MG tablet Take 20 mg by mouth daily as needed.    . irbesartan (AVAPRO) 300 MG tablet TAKE 1 TABLET BY MOUTH EVERY DAY 30 tablet 6  . ketoconazole (NIZORAL) 2 % cream APPLY TO THE AFFECTED AREA(S) UP TO TWICE  DAILY AS NEEDED - mix with fluticasone cream    . Multiple Vitamin (MULTIVITAMIN) tablet Take 1 tablet by mouth daily.    Marland Kitchen nystatin (MYCOSTATIN/NYSTOP) powder SMARTSIG:3 Gram(s) Topical Twice Daily    . Omega-3 Fatty Acids (FISH OIL) 1200 MG CAPS Take 1 capsule by mouth 2 (two) times daily before a meal.     . pantoprazole (PROTONIX) 40 MG tablet Take 1 tablet (40 mg total) by mouth 2 (two) times daily before a meal. 60 tablet 2  . potassium chloride SA (KLOR-CON) 20 MEQ tablet Take 20 mEq by mouth daily.    . Probiotic Product (PROBIOTIC PO) Take 1 capsule by mouth daily.     . rosuvastatin (CRESTOR) 20 MG tablet Take 20 mg by mouth daily.     No current facility-administered medications for this visit.   Allergies:  Other, Sulfa antibiotics, and Vancomycin   ROS: No syncope.  Physical Exam: VS:  BP 130/80   Pulse (!) 56   Ht 5\' 9"  (1.753 m)   Wt 202 lb 3.2 oz (91.7 kg)   SpO2 98%   BMI 29.86 kg/m , BMI Body mass index is 29.86 kg/m.  Wt Readings from Last 3 Encounters:  05/31/20 202 lb 3.2 oz (91.7 kg)  11/24/19 212 lb (96.2 kg)  11/21/19 209 lb 12.8 oz (95.2 kg)    General: Patient appears comfortable at rest. HEENT: Conjunctiva and lids normal, wearing a mask. Neck: Supple, no elevated JVP or carotid bruits, no thyromegaly. Lungs: Clear to auscultation, nonlabored breathing at rest. Cardiac: Regular rate and rhythm, no S3, 3/6 systolic murmur, no pericardial rub. Extremities: No pitting edema.  ECG:  An ECG dated 05/31/2019 was personally reviewed today and demonstrated:  Sinus bradycardia with PVC and PAC.  Recent Labwork: 06/07/2019: BUN 16; Creatinine, Ser 1.06; Potassium 4.3; Sodium 139   Other Studies Reviewed Today:  Cardiac catheterization 03/23/2016: 1. Mild CAD with nonobstructive left main and RCA stenosis 2. Calcified coronary arteries without significant stenoses 3. Mildly elevated LVEDP  CT coronary calcium score 06/08/2019: FINDINGS: Non-cardiac: See  separate report from [facility].  Ascending Aorta: Normal size, mild ascending and descending aortic wall calcification.  Pericardium: Normal  Coronary arteries: Normal origin and left and right coronary arteries. Calcification involving all 3 coronary arteries  IMPRESSION: 1. Coronary calcium score of 899. This was 70th percentile for age and sex matched control.  2. High calcium scores. Recommend guideline directed medical therapy and risk factor modification.  Lexiscan Myoview 07/07/2019:  The study is normal.  This is a low risk study.  The left ventricular ejection fraction is normal (55-65%).  There was no ST segment deviation noted during stress.   Diaphragmatic attenuation no ischemia Low risk study EF 59%  High-resolution chest CT 10/23/2019: IMPRESSION: 1. No definitive evidence of interstitial lung disease. Air trapping is indicative of small airways disease. 2. Right middle lobe pulmonary nodules, stable from 06/08/2019 but not  well seen on 02/13/2009. No follow-up needed if patient is low-risk (and has no known or suspected primary neoplasm). Non-contrast chest CT can be considered in 12 months if patient is high-risk. This recommendation follows the consensus statement: Guidelines for Management of Incidental Pulmonary Nodules Detected on CT Images: From the Fleischner Society 2017; Radiology 2017; 284:228-243. 3. Cholelithiasis. 4. Aortic atherosclerosis (ICD10-I70.0). Coronary artery calcification.  Echocardiogram 05/23/2020: 1. Left ventricular ejection fraction, by estimation, is 60 to 65%. The  left ventricle has normal function. The left ventricle has no regional  wall motion abnormalities. Left ventricular diastolic parameters are  consistent with Grade I diastolic  dysfunction (impaired relaxation). The average left ventricular global  longitudinal strain is 18.0 %. The global longitudinal strain is normal.  2. Right ventricular  systolic function is normal. The right ventricular  size is normal.  3. The mitral valve is normal in structure. Trivial mitral valve  regurgitation. No evidence of mitral stenosis.  4. The aortic valve has an indeterminant number of cusps. There is severe  calcifcation of the aortic valve. There is severe thickening of the aortic  valve. Aortic valve regurgitation is trivial. Mild to moderate aortic  valve stenosis. Aortic valve mean  gradient measures 14.4 mmHg. Aortic valve peak gradient measures 30.5  mmHg. Aortic valve area, by VTI measures 1.26 cm.  Assessment and Plan:  1.  Moderate calcific aortic stenosis as noted above.  Continue surveillance with repeat echocardiogram next year.  2.  History of mild coronary artery disease, follow-up Myoview in June of last year was low risk without significant ischemic territories.  LVEF also normal at 60 to 65%.  Continue Crestor.  3.  Dyspnea on exertion.  No evidence of interstitial lung disease by high-resolution chest CT.  Plan to reduce Coreg to 12.5 mg twice daily in case there is a component of chronotropic incompetence or symptomatic bradycardia.  4.  Essential hypertension, continue current dose of Avapro.  Medication Adjustments/Labs and Tests Ordered: Current medicines are reviewed at length with the patient today.  Concerns regarding medicines are outlined above.   Tests Ordered: No orders of the defined types were placed in this encounter.   Medication Changes: No orders of the defined types were placed in this encounter.   Disposition:  Follow up 6 months.  Signed, Satira Sark, MD, [facility] 05/31/2020 11:42 AM    [facility] [place], [place], [place] [address] Phone: [phone]; Fax: [phone]

## 2020-06-25 DIAGNOSIS — I10 Essential (primary) hypertension: Secondary | ICD-10-CM | POA: Diagnosis not present

## 2020-07-17 DIAGNOSIS — R5383 Other fatigue: Secondary | ICD-10-CM | POA: Diagnosis not present

## 2020-07-17 DIAGNOSIS — Z0000 Encounter for general adult medical examination without abnormal findings: Secondary | ICD-10-CM | POA: Diagnosis not present

## 2020-07-17 DIAGNOSIS — Z7189 Other specified counseling: Secondary | ICD-10-CM | POA: Diagnosis not present

## 2020-07-17 DIAGNOSIS — I10 Essential (primary) hypertension: Secondary | ICD-10-CM | POA: Diagnosis not present

## 2020-07-17 DIAGNOSIS — E7800 Pure hypercholesterolemia, unspecified: Secondary | ICD-10-CM | POA: Diagnosis not present

## 2020-07-17 DIAGNOSIS — Z299 Encounter for prophylactic measures, unspecified: Secondary | ICD-10-CM | POA: Diagnosis not present

## 2020-07-17 DIAGNOSIS — Z79899 Other long term (current) drug therapy: Secondary | ICD-10-CM | POA: Diagnosis not present

## 2020-07-25 DIAGNOSIS — I10 Essential (primary) hypertension: Secondary | ICD-10-CM | POA: Diagnosis not present

## 2020-08-07 ENCOUNTER — Other Ambulatory Visit: Payer: Self-pay

## 2020-08-07 ENCOUNTER — Encounter: Payer: Self-pay | Admitting: Urology

## 2020-08-07 ENCOUNTER — Ambulatory Visit: Payer: Medicare Other | Admitting: Urology

## 2020-08-07 VITALS — BP 133/73 | HR 79 | Temp 98.4°F | Wt 203.8 lb

## 2020-08-07 DIAGNOSIS — N3943 Post-void dribbling: Secondary | ICD-10-CM | POA: Diagnosis not present

## 2020-08-07 DIAGNOSIS — R102 Pelvic and perineal pain: Secondary | ICD-10-CM

## 2020-08-07 LAB — URINALYSIS, ROUTINE W REFLEX MICROSCOPIC
Bilirubin, UA: NEGATIVE
Glucose, UA: NEGATIVE
Ketones, UA: NEGATIVE
Leukocytes,UA: NEGATIVE
Nitrite, UA: NEGATIVE
Protein,UA: NEGATIVE
RBC, UA: NEGATIVE
Specific Gravity, UA: 1.01 (ref 1.005–1.030)
Urobilinogen, Ur: 0.2 mg/dL (ref 0.2–1.0)
pH, UA: 5.5 (ref 5.0–7.5)

## 2020-08-07 MED ORDER — CLOTRIMAZOLE-BETAMETHASONE 1-0.05 % EX CREA: 1.0000 "application " | TOPICAL_CREAM | Freq: Two times a day (BID) | CUTANEOUS | 11 refills | Status: AC

## 2020-08-07 MED ORDER — NYSTATIN 100000 UNIT/GM EX POWD: CUTANEOUS | 11 refills | Status: DC

## 2020-08-07 NOTE — Progress Notes (Signed)
08/07/2020 11:42 AM   Shane Osborne 11-04-1940 [id number]  Referring provider: Glenda Chroman, MD [place],  [place] [id number]  Followup post void dribbling and pelvic pain  HPI: Shane Osborne is a 80yo here for followup for post void dribbling and pelvic pain. He continues to have issues with intermittent flares of candida in his groin which causes intermittent pain. He uses clotrimazole cream and nystatin powder PRN with good results. He continue to have post void dribbling but he is not bothered by his LUTS. Urine stream strong. Nocturia 1x.  IPSS 5 QOL 0.    PMH: Past Medical History:  Diagnosis Date   Aortic stenosis    Childhood asthma    Coronary atherosclerosis    Mild at cardiac catheterization 2018   Essential hypertension    GERD (gastroesophageal reflux disease)    Hyperlipidemia     Surgical History: Past Surgical History:  Procedure Laterality Date   BIOPSY  07/27/2019   Procedure: BIOPSY;  Surgeon: Rogene Houston, MD;  [facility];  Service: Endoscopy;;   COLONOSCOPY  10/01/2010   Procedure: COLONOSCOPY;  Surgeon: Rogene Houston, MD;  [facility];  Service: Endoscopy;  Laterality: N/A;   COLONOSCOPY N/A 09/01/2013   Procedure: COLONOSCOPY;  Surgeon: Rogene Houston, MD;  [facility];  Service: Endoscopy;  Laterality: N/A;  940   COLONOSCOPY N/A 07/27/2019   Procedure: COLONOSCOPY;  Surgeon: Rogene Houston, MD;  [facility];  Service: Endoscopy;  Laterality: N/A;  1030   ESOPHAGOGASTRODUODENOSCOPY     ESOPHAGOGASTRODUODENOSCOPY N/A 04/11/2014   Procedure: ESOPHAGOGASTRODUODENOSCOPY (EGD);  Surgeon: Rogene Houston, MD;  [facility];  Service: Endoscopy;  Laterality: N/A;  1200 - moved to 12:55 - Ann to notify pt   ESOPHAGOGASTRODUODENOSCOPY N/A 07/27/2019   Procedure: ESOPHAGOGASTRODUODENOSCOPY (EGD);  Surgeon: Rogene Houston, MD;  [facility];  Service: Endoscopy;  Laterality: N/A;   LEFT  HEART CATH AND CORONARY ANGIOGRAPHY N/A 03/23/2016   Procedure: Left Heart Cath and Coronary Angiography;  Surgeon: Sherren Mocha, MD;  Location: [facility];  Service: Cardiovascular;  Laterality: N/A;   POLYPECTOMY  07/27/2019   Procedure: POLYPECTOMY;  Surgeon: Rogene Houston, MD;  [facility];  Service: Endoscopy;;   right leg  20 years ago   X 8 secondary to fracture   rt inguinal      hernia repair 2 months ago    Home Medications:  Allergies as of 08/07/2020       Reactions   Other    Brazilian nuts - break out in hive throat swelling    Sulfa Antibiotics Rash   Vancomycin Rash        Medication List        Accurate as of August 07, 2020 11:42 AM. If you have any questions, ask your nurse or doctor.          albuterol 108 (90 Base) MCG/ACT inhaler Commonly known as: VENTOLIN HFA Inhale 2 puffs into the lungs every 6 (six) hours as needed for wheezing or shortness of breath.   betamethasone valerate lotion 0.1 % Commonly known as: VALISONE APPLY TO THE AFFECTED AREA(S) TWICE DAILY   carvedilol 12.5 MG tablet Commonly known as: COREG Take 1 tablet (12.5 mg total) by mouth 2 (two) times daily.   clotrimazole-betamethasone cream Commonly known as: LOTRISONE Apply 1 application topically 2 (two) times daily.   Fish  Oil 1200 MG Caps Take 1 capsule by mouth 2 (two) times daily before a meal.   fluticasone 0.05 % cream Commonly known as: CUTIVATE APPLY TO THE AFFECTED AREA(S) UP TO TWICE DAILY AS NEEDED - mix with ketoconazole cream   furosemide 20 MG tablet Commonly known as: LASIX Take 20 mg by mouth daily as needed.   irbesartan 300 MG tablet Commonly known as: AVAPRO TAKE 1 TABLET BY MOUTH EVERY DAY   ketoconazole 2 % cream Commonly known as: NIZORAL APPLY TO THE AFFECTED AREA(S) UP TO TWICE DAILY AS NEEDED - mix with fluticasone cream   multivitamin tablet Take 1 tablet by mouth daily.   nystatin powder Commonly known as:  MYCOSTATIN/NYSTOP SMARTSIG:3 Gram(s) Topical Twice Daily   pantoprazole 40 MG tablet Commonly known as: PROTONIX Take 1 tablet (40 mg total) by mouth 2 (two) times daily before a meal.   potassium chloride SA 20 MEQ tablet Commonly known as: KLOR-CON Take 20 mEq by mouth daily.   PROBIOTIC PO Take 1 capsule by mouth daily.   rosuvastatin 20 MG tablet Commonly known as: CRESTOR Take 20 mg by mouth daily.        Allergies:  Allergies  Allergen Reactions   Other     Brazilian nuts - break out in hive throat swelling    Sulfa Antibiotics Rash   Vancomycin Rash    Family History: Family History  Problem Relation Age of Onset   Cancer Mother    Heart attack Father     Social History:  reports that he has never smoked. He has never used smokeless tobacco. He reports current alcohol use of about 5.0 standard drinks of alcohol per week. He reports that he does not use drugs.  ROS: All other review of systems were reviewed and are negative except what is noted above in HPI  Physical Exam: BP 133/73   Pulse 79   Temp 98.4 F (36.9 C)   Wt 203 lb 12.8 oz (92.4 kg)   BMI 30.10 kg/m   Constitutional:  Alert and oriented, No acute distress. HEENT: [place] AT, moist mucus membranes.  Trachea midline, no masses. Cardiovascular: No clubbing, cyanosis, or edema. Respiratory: Normal respiratory effort, no increased work of breathing. GI: Abdomen is soft, nontender, nondistended, no abdominal masses GU: No CVA tenderness.  Lymph: No cervical or inguinal lymphadenopathy. Skin: No rashes, bruises or suspicious lesions. Neurologic: Grossly intact, no focal deficits, moving all 4 extremities. Psychiatric: Normal mood and affect.  Laboratory Data: Lab Results  Component Value Date   WBC 6.7 03/20/2016   HGB 14.9 03/20/2016   HCT 43.1 03/20/2016   MCV 92.7 03/20/2016   PLT 259 03/20/2016    Lab Results  Component Value Date   CREATININE 1.06 06/07/2019    No results found  for: PSA  No results found for: TESTOSTERONE  No results found for: HGBA1C  Urinalysis No results found for: COLORURINE, APPEARANCEUR, LABSPEC, PHURINE, GLUCOSEU, HGBUR, BILIRUBINUR, KETONESUR, PROTEINUR, UROBILINOGEN, NITRITE, LEUKOCYTESUR  No results found for: LABMICR, [place], RBCUA, LABEPIT, MUCUS, BACTERIA  Pertinent Imaging:  No results found for this or any previous visit.  No results found for this or any previous visit.  No results found for this or any previous visit.  No results found for this or any previous visit.  No results found for this or any previous visit.  No results found for this or any previous visit.  No results found for this or any previous visit.  No results  found for this or any previous visit.   Assessment & Plan:    1. Post-void dribbling Patient defers therapy at this time since he is not bothered by his dribbling - Urinalysis, Routine w reflex microscopic  2. Pelvic and perineal pain Continue clotrimazole ointment and nystatin powder PRN   No follow-ups on file.  Nicolette Bang, MD  [facility] [place]

## 2020-08-07 NOTE — Progress Notes (Signed)
Urological Symptom Review  Patient is experiencing the following symptoms: Frequent urination Leakage of urine   Review of Systems  Gastrointestinal (upper)  : Negative for upper GI symptoms  Gastrointestinal (lower) : Negative for lower GI symptoms  Constitutional : Fatigue  Skin: Negative for skin symptoms  Eyes: Negative for eye symptoms  Ear/Nose/Throat : Negative for Ear/Nose/Throat symptoms  Hematologic/Lymphatic: Easy bruising  Cardiovascular : Leg swelling  Respiratory : Negative for respiratory symptoms  Endocrine: Negative for endocrine symptoms  Musculoskeletal: Negative for musculoskeletal symptoms  Neurological: Negative for neurological symptoms  Psychologic: Negative for psychiatric symptoms

## 2020-08-07 NOTE — Patient Instructions (Signed)
Benign Prostatic Hyperplasia  Benign prostatic hyperplasia (BPH) is an enlarged prostate gland that is caused by the normal aging process and not by cancer. The prostate is a walnut-sized gland that is involved in the production of semen. It is located in front of the rectum and below the bladder. The bladder stores urine and the urethra is the tube that carries the urine out of the body. The prostate may get bigger asa man gets older. An enlarged prostate can press on the urethra. This can make it harder to pass urine. The build-up of urine in the bladder can cause infection. Back pressure and infection may progress to bladder damage and kidney (renal) failure. What are the causes? This condition is part of a normal aging process. However, not all men develop problems from this condition. If the prostate enlarges away from the urethra, urine flow will not be blocked. If it enlarges toward the urethra andcompresses it, there will be problems passing urine. What increases the risk? This condition is more likely to develop in men over the age of 50 years. What are the signs or symptoms? Symptoms of this condition include: Getting up often during the night to urinate. Needing to urinate frequently during the day. Difficulty starting urine flow. Decrease in size and strength of your urine stream. Leaking (dribbling) after urinating. Inability to pass urine. This needs immediate treatment. Inability to completely empty your bladder. Pain when you pass urine. This is more common if there is also an infection. Urinary tract infection (UTI). How is this diagnosed? This condition is diagnosed based on your medical history, a physical exam, and your symptoms. Tests will also be done, such as: A post-void bladder scan. This measures any amount of urine that may remain in your bladder after you finish urinating. A digital rectal exam. In a rectal exam, your health care provider checks your prostate by  putting a lubricated, gloved finger into your rectum to feel the back of your prostate gland. This exam detects the size of your gland and any abnormal lumps or growths. An exam of your urine (urinalysis). A prostate specific antigen (PSA) screening. This is a blood test used to screen for prostate cancer. An ultrasound. This test uses sound waves to electronically produce a picture of your prostate gland. Your health care provider may refer you to a specialist in kidney and prostate diseases (urologist). How is this treated? Once symptoms begin, your health care provider will monitor your condition (active surveillance or watchful waiting). Treatment for this condition will depend on the severity of your condition. Treatment may include: Observation and yearly exams. This may be the only treatment needed if your condition and symptoms are mild. Medicines to relieve your symptoms, including: Medicines to shrink the prostate. Medicines to relax the muscle of the prostate. Surgery in severe cases. Surgery may include: Prostatectomy. In this procedure, the prostate tissue is removed completely through an open incision or with a laparoscope or robotics. Transurethral resection of the prostate (TURP). In this procedure, a tool is inserted through the opening at the tip of the penis (urethra). It is used to cut away tissue of the inner core of the prostate. The pieces are removed through the same opening of the penis. This removes the blockage. Transurethral incision (TUIP). In this procedure, small cuts are made in the prostate. This lessens the prostate's pressure on the urethra. Transurethral microwave thermotherapy (TUMT). This procedure uses microwaves to create heat. The heat destroys and removes a small   amount of prostate tissue. Transurethral needle ablation (TUNA). This procedure uses radio frequencies to destroy and remove a small amount of prostate tissue. Interstitial laser coagulation (ILC).  This procedure uses a laser to destroy and remove a small amount of prostate tissue. Transurethral electrovaporization (TUVP). This procedure uses electrodes to destroy and remove a small amount of prostate tissue. Prostatic urethral lift. This procedure inserts an implant to push the lobes of the prostate away from the urethra. Follow these instructions at home: Take over-the-counter and prescription medicines only as told by your health care provider. Monitor your symptoms for any changes. Contact your health care provider with any changes. Avoid drinking large amounts of liquid before going to bed or out in public. Avoid or reduce how much caffeine or alcohol you drink. Give yourself time when you urinate. Keep all follow-up visits as told by your health care provider. This is important. Contact a health care provider if: You have unexplained back pain. Your symptoms do not get better with treatment. You develop side effects from the medicine you are taking. Your urine becomes very dark or has a bad smell. Your lower abdomen becomes distended and you have trouble passing your urine. Get help right away if: You have a fever or chills. You suddenly cannot urinate. You feel lightheaded, or very dizzy, or you faint. There are large amounts of blood or clots in the urine. Your urinary problems become hard to manage. You develop moderate to severe low back or flank pain. The flank is the side of your body between the ribs and the hip. These symptoms may represent a serious problem that is an emergency. Do not wait to see if the symptoms will go away. Get medical help right away. Call your local emergency services (911 in the [place] Do not drive yourself to the hospital. Summary Benign prostatic hyperplasia (BPH) is an enlarged prostate that is caused by the normal aging process and not by cancer. An enlarged prostate can press on the urethra. This can make it hard to pass urine. This  condition is part of a normal aging process and is more likely to develop in men over the age of 50 years. Get help right away if you suddenly cannot urinate. This information is not intended to replace advice given to you by your health care provider. Make sure you discuss any questions you have with your healthcare provider. Document Revised: 09/21/2019 Document Reviewed: 09/21/2019 Elsevier Patient Education  2022 [employer]  

## 2020-08-23 DIAGNOSIS — I10 Essential (primary) hypertension: Secondary | ICD-10-CM | POA: Diagnosis not present

## 2020-08-25 DIAGNOSIS — E7800 Pure hypercholesterolemia, unspecified: Secondary | ICD-10-CM | POA: Diagnosis not present

## 2020-08-25 DIAGNOSIS — M159 Polyosteoarthritis, unspecified: Secondary | ICD-10-CM | POA: Diagnosis not present

## 2020-08-25 DIAGNOSIS — E1165 Type 2 diabetes mellitus with hyperglycemia: Secondary | ICD-10-CM | POA: Diagnosis not present

## 2020-08-25 DIAGNOSIS — I10 Essential (primary) hypertension: Secondary | ICD-10-CM | POA: Diagnosis not present

## 2020-09-25 DIAGNOSIS — I10 Essential (primary) hypertension: Secondary | ICD-10-CM | POA: Diagnosis not present

## 2020-10-18 ENCOUNTER — Emergency Department (HOSPITAL_COMMUNITY): Payer: Medicare Other

## 2020-10-18 ENCOUNTER — Other Ambulatory Visit: Payer: Self-pay

## 2020-10-18 ENCOUNTER — Observation Stay (HOSPITAL_BASED_OUTPATIENT_CLINIC_OR_DEPARTMENT_OTHER): Payer: Medicare Other

## 2020-10-18 ENCOUNTER — Encounter (HOSPITAL_COMMUNITY): Payer: Self-pay | Admitting: Emergency Medicine

## 2020-10-18 ENCOUNTER — Observation Stay (HOSPITAL_COMMUNITY)
Admission: EM | Admit: 2020-10-18 | Discharge: 2020-10-18 | Disposition: A | Discharge status: Home / Self Care | Payer: Medicare Other | Attending: Internal Medicine | Admitting: Internal Medicine

## 2020-10-18 DIAGNOSIS — I10 Essential (primary) hypertension: Secondary | ICD-10-CM | POA: Diagnosis not present

## 2020-10-18 DIAGNOSIS — R072 Precordial pain: Secondary | ICD-10-CM | POA: Diagnosis not present

## 2020-10-18 DIAGNOSIS — E785 Hyperlipidemia, unspecified: Secondary | ICD-10-CM | POA: Diagnosis not present

## 2020-10-18 DIAGNOSIS — K8020 Calculus of gallbladder without cholecystitis without obstruction: Secondary | ICD-10-CM | POA: Diagnosis not present

## 2020-10-18 DIAGNOSIS — R079 Chest pain, unspecified: Secondary | ICD-10-CM | POA: Diagnosis not present

## 2020-10-18 DIAGNOSIS — R0689 Other abnormalities of breathing: Secondary | ICD-10-CM | POA: Diagnosis not present

## 2020-10-18 DIAGNOSIS — R0789 Other chest pain: Secondary | ICD-10-CM | POA: Diagnosis present

## 2020-10-18 DIAGNOSIS — Z20822 Contact with and (suspected) exposure to covid-19: Secondary | ICD-10-CM | POA: Insufficient documentation

## 2020-10-18 DIAGNOSIS — R911 Solitary pulmonary nodule: Secondary | ICD-10-CM | POA: Diagnosis not present

## 2020-10-18 DIAGNOSIS — Z79899 Other long term (current) drug therapy: Secondary | ICD-10-CM | POA: Diagnosis not present

## 2020-10-18 DIAGNOSIS — J45909 Unspecified asthma, uncomplicated: Secondary | ICD-10-CM | POA: Diagnosis not present

## 2020-10-18 DIAGNOSIS — I350 Nonrheumatic aortic (valve) stenosis: Secondary | ICD-10-CM | POA: Diagnosis not present

## 2020-10-18 DIAGNOSIS — I959 Hypotension, unspecified: Secondary | ICD-10-CM | POA: Diagnosis not present

## 2020-10-18 DIAGNOSIS — J9811 Atelectasis: Secondary | ICD-10-CM | POA: Diagnosis not present

## 2020-10-18 LAB — BASIC METABOLIC PANEL
Anion gap: 8 (ref 5–15)
BUN: 9 mg/dL (ref 8–23)
CO2: 25 mmol/L (ref 22–32)
Calcium: 8.7 mg/dL — ABNORMAL LOW (ref 8.9–10.3)
Chloride: 106 mmol/L (ref 98–111)
Creatinine, Ser: 1.01 mg/dL (ref 0.61–1.24)
GFR, Estimated: >60 mL/min (ref >60)
Glucose, Bld: 94 mg/dL (ref 70–99)
Potassium: 3.8 mmol/L (ref 3.5–5.1)
Sodium: 139 mmol/L (ref 135–145)

## 2020-10-18 LAB — TROPONIN I (HIGH SENSITIVITY)
Troponin I (High Sensitivity): 8 ng/L (ref <18)
Troponin I (High Sensitivity): 7 ng/L (ref <18)
Troponin I (High Sensitivity): 10 ng/L (ref <18)

## 2020-10-18 LAB — C-REACTIVE PROTEIN: CRP: 0.9 mg/dL (ref <1.0)

## 2020-10-18 LAB — ECHOCARDIOGRAM LIMITED
Area-P 1/2: 2.63 cm2
Calc EF: 61.3 %
Height: 69 in
S' Lateral: 2.4 cm
Single Plane A2C EF: 64.6 %
Single Plane A4C EF: 63.7 %
Weight: 3259.28 oz

## 2020-10-18 LAB — RESP PANEL BY RT-PCR (FLU A&B, COVID) ARPGX2
Influenza A by PCR: NEGATIVE
Influenza B by PCR: NEGATIVE
SARS Coronavirus 2 by RT PCR: NEGATIVE

## 2020-10-18 LAB — CBC
HCT: 44 % (ref 39.0–52.0)
Hemoglobin: 14.6 g/dL (ref 13.0–17.0)
MCH: 32.7 pg (ref 26.0–34.0)
MCHC: 33.2 g/dL (ref 30.0–36.0)
MCV: 98.4 fL (ref 80.0–100.0)
Platelets: 215 10*3/uL (ref 150–400)
RBC: 4.47 MIL/uL (ref 4.22–5.81)
RDW: 13.1 % (ref 11.5–15.5)
WBC: 7.9 10*3/uL (ref 4.0–10.5)
nRBC: 0 % (ref 0.0–0.2)

## 2020-10-18 LAB — SEDIMENTATION RATE: Sed Rate: 18 mm/hr — ABNORMAL HIGH (ref 0–16)

## 2020-10-18 MED ORDER — ROSUVASTATIN CALCIUM 20 MG PO TABS
20.0000 mg | ORAL_TABLET | Freq: Every day | ORAL | Status: DC
  Administered 2020-10-18: 20 mg via ORAL
  Filled 2020-10-18: qty 1

## 2020-10-18 MED ORDER — IRBESARTAN 150 MG PO TABS
300.0000 mg | ORAL_TABLET | Freq: Every day | ORAL | Status: DC
  Administered 2020-10-18: 300 mg via ORAL
  Filled 2020-10-18: qty 2

## 2020-10-18 MED ORDER — ALBUTEROL SULFATE HFA 108 (90 BASE) MCG/ACT IN AERS: 2.0000 | INHALATION_SPRAY | Freq: Four times a day (QID) | RESPIRATORY_TRACT | Status: DC | PRN

## 2020-10-18 MED ORDER — SODIUM CHLORIDE 0.9 % IV SOLN: 250.0000 mL | INTRAVENOUS | Status: DC | PRN

## 2020-10-18 MED ORDER — ALBUTEROL SULFATE (2.5 MG/3ML) 0.083% IN NEBU: 2.5000 mg | INHALATION_SOLUTION | Freq: Four times a day (QID) | RESPIRATORY_TRACT | Status: DC | PRN

## 2020-10-18 MED ORDER — SODIUM CHLORIDE 0.9% FLUSH: 3.0000 mL | Freq: Two times a day (BID) | INTRAVENOUS | Status: DC

## 2020-10-18 MED ORDER — SODIUM CHLORIDE 0.9% FLUSH: 3.0000 mL | INTRAVENOUS | Status: DC | PRN

## 2020-10-18 MED ORDER — ACETAMINOPHEN 325 MG PO TABS: 650.0000 mg | ORAL_TABLET | Freq: Four times a day (QID) | ORAL | Status: DC | PRN

## 2020-10-18 MED ORDER — IOHEXOL 350 MG/ML SOLN
100.0000 mL | Freq: Once | INTRAVENOUS | Status: AC | PRN
  Administered 2020-10-18: 100 mL via INTRAVENOUS

## 2020-10-18 MED ORDER — CARVEDILOL 12.5 MG PO TABS
12.5000 mg | ORAL_TABLET | Freq: Two times a day (BID) | ORAL | Status: DC
  Administered 2020-10-18: 12.5 mg via ORAL
  Filled 2020-10-18: qty 1

## 2020-10-18 MED ORDER — SODIUM CHLORIDE 0.9 % IV BOLUS
500.0000 mL | Freq: Once | INTRAVENOUS | Status: AC
  Administered 2020-10-18: 500 mL via INTRAVENOUS

## 2020-10-18 MED ORDER — ONDANSETRON HCL 4 MG PO TABS: 4.0000 mg | ORAL_TABLET | Freq: Four times a day (QID) | ORAL | Status: DC | PRN

## 2020-10-18 MED ORDER — MORPHINE SULFATE (PF) 4 MG/ML IV SOLN
4.0000 mg | Freq: Once | INTRAVENOUS | Status: AC
  Administered 2020-10-18: 4 mg via INTRAVENOUS
  Filled 2020-10-18: qty 1

## 2020-10-18 MED ORDER — ACETAMINOPHEN 650 MG RE SUPP: 650.0000 mg | Freq: Four times a day (QID) | RECTAL | Status: DC | PRN

## 2020-10-18 MED ORDER — ONDANSETRON HCL 4 MG/2ML IJ SOLN: 4.0000 mg | Freq: Four times a day (QID) | INTRAMUSCULAR | Status: DC | PRN

## 2020-10-18 MED ORDER — ENOXAPARIN SODIUM 40 MG/0.4ML IJ SOSY
40.0000 mg | PREFILLED_SYRINGE | INTRAMUSCULAR | Status: DC
  Administered 2020-10-18: 40 mg via SUBCUTANEOUS
  Filled 2020-10-18: qty 0.4

## 2020-10-18 MED ORDER — ONDANSETRON HCL 4 MG/2ML IJ SOLN
4.0000 mg | Freq: Once | INTRAMUSCULAR | Status: AC
  Administered 2020-10-18: 4 mg via INTRAVENOUS
  Filled 2020-10-18: qty 2

## 2020-10-18 NOTE — Plan of Care (Signed)
  Problem: Education: Goal: Knowledge of General Education information will improve Description: Including pain rating scale, medication(s)/side effects and non-pharmacologic comfort measures 10/18/2020 1600 by Melony Overly, RN Outcome: Adequate for Discharge 10/18/2020 1559 by Melony Overly, RN Outcome: Adequate for Discharge   Problem: Health Behavior/Discharge Planning: Goal: Ability to manage health-related needs will improve 10/18/2020 1600 by Melony Overly, RN Outcome: Adequate for Discharge 10/18/2020 1559 by Melony Overly, RN Outcome: Adequate for Discharge   Problem: Clinical Measurements: Goal: Ability to maintain clinical measurements within normal limits will improve 10/18/2020 1600 by Melony Overly, RN Outcome: Adequate for Discharge 10/18/2020 1559 by Melony Overly, RN Outcome: Adequate for Discharge Goal: Will remain free from infection 10/18/2020 1600 by Melony Overly, RN Outcome: Adequate for Discharge 10/18/2020 1559 by Melony Overly, RN Outcome: Adequate for Discharge Goal: Diagnostic test results will improve 10/18/2020 1600 by Melony Overly, RN Outcome: Adequate for Discharge 10/18/2020 1559 by Melony Overly, RN Outcome: Adequate for Discharge Goal: Respiratory complications will improve 10/18/2020 1600 by Melony Overly, RN Outcome: Adequate for Discharge 10/18/2020 1559 by Melony Overly, RN Outcome: Adequate for Discharge Goal: Cardiovascular complication will be avoided 10/18/2020 1600 by Melony Overly, RN Outcome: Adequate for Discharge 10/18/2020 1559 by Melony Overly, RN Outcome: Adequate for Discharge   Problem: Activity: Goal: Risk for activity intolerance will decrease 10/18/2020 1600 by Melony Overly, RN Outcome: Adequate for Discharge 10/18/2020 1559 by Melony Overly, RN Outcome: Adequate for Discharge   Problem: Nutrition: Goal: Adequate nutrition will be maintained 10/18/2020 1600 by Melony Overly, RN Outcome:  Adequate for Discharge 10/18/2020 1559 by Melony Overly, RN Outcome: Adequate for Discharge   Problem: Coping: Goal: Level of anxiety will decrease 10/18/2020 1600 by Melony Overly, RN Outcome: Adequate for Discharge 10/18/2020 1559 by Melony Overly, RN Outcome: Adequate for Discharge   Problem: Elimination: Goal: Will not experience complications related to bowel motility 10/18/2020 1600 by Melony Overly, RN Outcome: Adequate for Discharge 10/18/2020 1559 by Melony Overly, RN Outcome: Adequate for Discharge Goal: Will not experience complications related to urinary retention 10/18/2020 1600 by Melony Overly, RN Outcome: Adequate for Discharge 10/18/2020 1559 by Melony Overly, RN Outcome: Adequate for Discharge   Problem: Pain Managment: Goal: General experience of comfort will improve 10/18/2020 1600 by Melony Overly, RN Outcome: Adequate for Discharge 10/18/2020 1559 by Melony Overly, RN Outcome: Adequate for Discharge   Problem: Safety: Goal: Ability to remain free from injury will improve 10/18/2020 1600 by Melony Overly, RN Outcome: Adequate for Discharge 10/18/2020 1559 by Melony Overly, RN Outcome: Adequate for Discharge   Problem: Skin Integrity: Goal: Risk for impaired skin integrity will decrease 10/18/2020 1600 by Melony Overly, RN Outcome: Adequate for Discharge 10/18/2020 1559 by Melony Overly, RN Outcome: Adequate for Discharge

## 2020-10-18 NOTE — Progress Notes (Signed)
Removed patient's oxygen per orders. Patient while sitting with an oxygen saturation of 99-100 on room air. While ambulating in the hallway, oxygen saturations range from 94-98. Patient had some slight discomfort in chest but no increase in pain during ambulation.

## 2020-10-18 NOTE — H&P (Signed)
History and Physical    ANTIONO Osborne NAT:[id number] DOB: 1940/10/13 DOA: 10/18/2020  Referring MD/NP/PA: Veryl Speak MD PCP: Glenda Chroman, MD  Outpatient Specialists: Pulmonology, Elsworth Soho MD.  Patient coming from: home  Chief Complaint: chest pain, dyspnea with exertion  HPI: Shane Osborne is a 80 y.o. male with medical history significant of hypertension, aortic valve stenosis, gerd, hyperlipidemia, and dyspnea on exertion for 'several months' which he has been seeing pulmonology. He reports his pain this morning as a constant sharp ache, from epigastrium and sternal area, radiating to the left chest and left arm, without nausea or vomiting. The pain awoke him from sleep at 0100 10/18/20, and taking a deep breath causes him severe pain. Deep breaths provoke his pain, currently he is breathing shallow to avoid provocation. He reports episodes of paleness and diaphoresis with his pain, exertion worsens his pain and provokes his diaphoresis. He denies any precipitating events last night, reports dinner of tomatoes, crackers, mayonnaise. He reports a history of gerd, and confidently reports this is absolutely not consistent with his gerd. He reports he takes his carvedilol, rosuvastatin, irbesartan daily, takes his furosemide as needed for lower extremity swelling, has not taken it recently. He reports no nausea, no vomiting, reports one normal non bloody bowel movement yesterday, no cough, no fever, no chills, no recent viral uri symptoms, no sick contacts, no recent chest pain aside from the current event. Reports recently possibly darker urine, but denies less urine, urgency, pain, blood, dysuria.  ED Course: Mr Suchy presented with 'chest pain' and shortness of breath, 12 lead was acquired, cbc, bmp, high sensitivity troponins, morphine 4mg  IV was given without relief, chest CTA was without findings of a PE, 12 lead is without st segment elevation or depression but has infrequent PVC's. Ntg was given  prior to ED arrival by EMS without relief, asa as well due to 'chest pain'.  Review of Systems: Body mass index is 30.08 kg/m.  General:  Well nourished, well developed, in no acute distress, mildly uncomfortable HEENT: normal, no erythema, no scleral jaundice Neck: no JVD Vascular: Distal pulses 2+ bilaterally, cap refill less than 2 seconds,  Cardiac:  normal S1, S2; RRR with occasional ectopic beats, mildly tachycardic. 2/6 systolic ejection murmur along RUSB.  Lungs:  clear to auscultation bilaterally, no wheezing, rhonchi or rales, limited inhalation due to patient reports severe pain with deep inhalation, patient maintains a shallow tidal volume with the current pain Abd: soft, ruq tenderness to deep palpation, no hepatomegaly noted, inspiratory halt noted with ruq deep palpation and inhalation, patient reports that reproduces the current pain. Ext: no pitting edema, previous r ankle scar from distant past surgery for right ankle osteomyelitis Musculoskeletal:  5/5 strength in all extremities, moves appropriately Skin: pink, warm, dry, small scale noted left upper chest, months old per pt and is known to his pcp Neuro:  CNs 2-12 intact, no focal abnormalities noted Psych:  Normal affect, normal mood, cooperative  Past Medical History:  Diagnosis Date   Aortic stenosis    Childhood asthma    Coronary atherosclerosis    Mild at cardiac catheterization 2018   Essential hypertension    GERD (gastroesophageal reflux disease)    Hyperlipidemia     Past Surgical History:  Procedure Laterality Date   BIOPSY  07/27/2019   Procedure: BIOPSY;  Surgeon: Rogene Houston, MD;  [facility];  Service: Endoscopy;;   COLONOSCOPY  10/01/2010   Procedure: COLONOSCOPY;  Surgeon: Bernadene Person  Gloriann Loan, MD;  [facility];  Service: Endoscopy;  Laterality: N/A;   COLONOSCOPY N/A 09/01/2013   Procedure: COLONOSCOPY;  Surgeon: Rogene Houston, MD;  [facility];  Service:  Endoscopy;  Laterality: N/A;  940   COLONOSCOPY N/A 07/27/2019   Procedure: COLONOSCOPY;  Surgeon: Rogene Houston, MD;  [facility];  Service: Endoscopy;  Laterality: N/A;  1030   ESOPHAGOGASTRODUODENOSCOPY     ESOPHAGOGASTRODUODENOSCOPY N/A 04/11/2014   Procedure: ESOPHAGOGASTRODUODENOSCOPY (EGD);  Surgeon: Rogene Houston, MD;  [facility];  Service: Endoscopy;  Laterality: N/A;  1200 - moved to 12:55 - Ann to notify pt   ESOPHAGOGASTRODUODENOSCOPY N/A 07/27/2019   Procedure: ESOPHAGOGASTRODUODENOSCOPY (EGD);  Surgeon: Rogene Houston, MD;  [facility];  Service: Endoscopy;  Laterality: N/A;   LEFT HEART CATH AND CORONARY ANGIOGRAPHY N/A 03/23/2016   Procedure: Left Heart Cath and Coronary Angiography;  Surgeon: Sherren Mocha, MD;  Location: [facility];  Service: Cardiovascular;  Laterality: N/A;   POLYPECTOMY  07/27/2019   Procedure: POLYPECTOMY;  Surgeon: Rogene Houston, MD;  [facility];  Service: Endoscopy;;   right leg  20 years ago   X 8 secondary to fracture   rt inguinal      hernia repair 2 months ago     reports that he has never smoked. He has never used smokeless tobacco. He reports current alcohol use of about 5.0 standard drinks per week. He reports that he does not use drugs.  Allergies  Allergen Reactions   Other     Brazilian nuts - break out in hive throat swelling    Sulfa Antibiotics Rash   Vancomycin Rash    Family History  Problem Relation Age of Onset   Cancer Mother    Heart attack Father     Prior to Admission medications   Medication Sig Start Date End Date Taking? Authorizing Provider  albuterol (VENTOLIN HFA) 108 (90 Base) MCG/ACT inhaler Inhale 2 puffs into the lungs every 6 (six) hours as needed for wheezing or shortness of breath. 11/21/19  Yes Rigoberto Noel, MD  betamethasone valerate lotion (VALISONE) 0.1 % Apply 1 application topically daily as needed for irritation. 02/27/19  Yes [provider]  carvedilol (COREG) 12.5 MG tablet Take 1 tablet (12.5 mg total) by mouth 2 (two) times daily. 05/31/20  Yes Satira Sark, MD  clotrimazole-betamethasone (LOTRISONE) cream Apply 1 application topically 2 (two) times daily. 08/07/20  Yes McKenzie, Candee Furbish, MD  fluticasone (CUTIVATE) 0.05 % cream Apply 1 application topically daily as needed (irritation). 07/03/19  Yes [provider]  furosemide (LASIX) 20 MG tablet Take 20 mg by mouth daily as needed.   Yes [provider]  irbesartan (AVAPRO) 300 MG tablet TAKE 1 TABLET BY MOUTH EVERY DAY 01/12/20  Yes Verta Ellen., NP  ketoconazole (NIZORAL) 2 % cream Apply 1 application topically 2 (two) times daily as needed for irritation. 07/03/19  Yes [provider]  Multiple Vitamin (MULTIVITAMIN) tablet Take 1 tablet by mouth daily.   Yes [provider]  Omega-3 Fatty Acids (FISH OIL) 1200 MG CAPS Take 1 capsule by mouth 2 (two) times daily before a meal.    Yes [provider]  potassium chloride SA (KLOR-CON) 20 MEQ tablet Take 20 mEq by mouth daily.   Yes [provider]  Probiotic Product (PROBIOTIC PO) Take 1 capsule by mouth daily.  Yes [provider]  rosuvastatin (CRESTOR) 20 MG tablet Take 20 mg by mouth daily.   Yes [provider]  nystatin (MYCOSTATIN/NYSTOP) powder SMARTSIG:3 Gram(s) Topical Twice Daily Patient not taking: Reported on 10/18/2020 08/07/20   Cleon Gustin, MD  pantoprazole (PROTONIX) 40 MG tablet Take 1 tablet (40 mg total) by mouth 2 (two) times daily before a meal. Patient not taking: No sig reported 07/27/19   Rogene Houston, MD    Physical Exam: Vitals:   10/18/20 0700 10/18/20 0730 10/18/20 0853 10/18/20 0932  BP: (!) 151/95 (!) 150/81 132/78 (!) 147/87  Pulse: (!) 106 (!) 101 97 98  Resp: (!) 28 (!) 21 (!) 26 18  Temp:   100.1 F (37.8 C) 98 F (36.7 C)  TempSrc:    Oral  SpO2: 100% 99% 100% 99%  Weight:       Height:          Constitutional: NAD, calm, comfortable Vitals:   10/18/20 0700 10/18/20 0730 10/18/20 0853 10/18/20 0932  BP: (!) 151/95 (!) 150/81 132/78 (!) 147/87  Pulse: (!) 106 (!) 101 97 98  Resp: (!) 28 (!) 21 (!) 26 18  Temp:   100.1 F (37.8 C) 98 F (36.7 C)  TempSrc:    Oral  SpO2: 100% 99% 100% 99%  Weight:      Height:       Labs on Admission: I have personally reviewed following labs and imaging studies  CBC: Recent Labs  Lab 10/18/20 0335  WBC 7.9  HGB 14.6  HCT 44.0  MCV 98.4  PLT 270   Basic Metabolic Panel: Recent Labs  Lab 10/18/20 0335  NA 139  K 3.8  CL 106  CO2 25  GLUCOSE 94  BUN 9  CREATININE 1.01  CALCIUM 8.7*   GFR: Estimated Creatinine Clearance: 65.5 mL/min (by C-G formula based on SCr of 1.01 mg/dL). Cardiac Enzymes: High sensitivity troponin 1, 10/18/2020 0335 8, 0515 7, 0858 10. Urine analysis:    Component Value Date/Time   APPEARANCEUR Clear 08/07/2020 1130   GLUCOSEU Negative 08/07/2020 1130   BILIRUBINUR Negative 08/07/2020 1130   PROTEINUR Negative 08/07/2020 1130   NITRITE Negative 08/07/2020 1130   LEUKOCYTESUR Negative 08/07/2020 1130   Sepsis Labs: @LABRCNTIP (procalcitonin:4,lacticidven:4) ) Recent Results (from the past 240 hour(s))  Resp Panel by RT-PCR (Flu A&B, Covid) Nasopharyngeal Swab     Status: None   Collection Time: 10/18/20  6:42 AM   Specimen: Nasopharyngeal Swab; Nasopharyngeal(NP) swabs in vial transport medium  Result Value Ref Range Status   SARS Coronavirus 2 by RT PCR NEGATIVE NEGATIVE Final    Comment: (NOTE) SARS-CoV-2 target nucleic acids are NOT DETECTED.  The SARS-CoV-2 RNA is generally detectable in upper respiratory specimens during the acute phase of infection. The lowest concentration of SARS-CoV-2 viral copies this assay can detect is 138 copies/mL. A negative result does not preclude SARS-Cov-2 infection and should not be used as the sole basis for treatment or other  patient management decisions. A negative result may occur with  improper specimen collection/handling, submission of specimen other than nasopharyngeal swab, presence of viral mutation(s) within the areas targeted by this assay, and inadequate number of viral copies(<138 copies/mL). A negative result must be combined with clinical observations, patient history, and epidemiological information. The expected result is Negative.  Fact Sheet for Patients:  EntrepreneurPulse.com.au  Fact Sheet for Healthcare Providers:  IncredibleEmployment.be  This test is no t yet approved or cleared by the [place]  States FDA and  has been authorized for detection and/or diagnosis of SARS-CoV-2 by FDA under an Emergency Use Authorization (EUA). This EUA will remain  in effect (meaning this test can be used) for the duration of the COVID-19 declaration under Section 564(b)(1) of the Act, 21 U.S.C.section 360bbb-3(b)(1), unless the authorization is terminated  or revoked sooner.       Influenza A by PCR NEGATIVE NEGATIVE Final   Influenza B by PCR NEGATIVE NEGATIVE Final    Comment: (NOTE) The Xpert Xpress SARS-CoV-2/FLU/RSV plus assay is intended as an aid in the diagnosis of influenza from Nasopharyngeal swab specimens and should not be used as a sole basis for treatment. Nasal washings and aspirates are unacceptable for Xpert Xpress SARS-CoV-2/FLU/RSV testing.  Fact Sheet for Patients: EntrepreneurPulse.com.au  Fact Sheet for Healthcare Providers: IncredibleEmployment.be  This test is not yet approved or cleared by the [place] FDA and has been authorized for detection and/or diagnosis of SARS-CoV-2 by FDA under an Emergency Use Authorization (EUA). This EUA will remain in effect (meaning this test can be used) for the duration of the COVID-19 declaration under Section 564(b)(1) of the Act, 21 U.S.C. section  360bbb-3(b)(1), unless the authorization is terminated or revoked.  Performed at [facility], [address], [place], [place] [address]      Radiological Exams on Admission: DG Chest 2 View  Result Date: 10/18/2020 CLINICAL DATA:  Chest pain. Sudden substernal chest pain with left arm and jaw pain. EXAM: CHEST - 2 VIEW COMPARISON:  Chest radiograph 03/20/2016.  CT chest 10/23/2019 FINDINGS: Shallow inspiration. Atelectasis in both lung bases. Heart size and pulmonary vascularity are normal for technique. No definite consolidation, effusion, or pneumothorax. Colonic interposition under the right hemidiaphragm. Calcification of the aorta. Degenerative changes in the spine and shoulders. IMPRESSION: Shallow inspiration with atelectasis in the lung bases. Electronically Signed   By: Lucienne Capers M.D.   On: 10/18/2020 03:26   CT Angio Chest PE W and/or Wo Contrast  Result Date: 10/18/2020 CLINICAL DATA:  PE. Sub sternal chest pain that awoke patient from sleep. Also complains of pain in the right arm. EXAM: CT ANGIOGRAPHY CHEST WITH CONTRAST TECHNIQUE: Multidetector CT imaging of the chest was performed using the standard protocol during bolus administration of intravenous contrast. Multiplanar CT image reconstructions and MIPs were obtained to evaluate the vascular anatomy. CONTRAST:  167mL OMNIPAQUE IOHEXOL 350 MG/ML SOLN COMPARISON:  CT chest 10/23/2019 FINDINGS: Cardiovascular: Satisfactory opacification of the pulmonary arteries to the segmental level. No evidence of pulmonary embolism. Aortic atherosclerosis. Coronary artery calcifications. Heart size normal. No pericardial effusion. Normal heart size. No pericardial effusion. Mediastinum/Nodes: No enlarged mediastinal, hilar, or axillary lymph nodes. Thyroid gland, trachea, and esophagus demonstrate no significant findings. Lungs/Pleura: No pleural effusion. Areas of subsegmental scarring with volume loss are noted within the right upper lobe,  right middle lobe and right lower lobe. Dependent changes are noted within the posterior lung bases. Mild scattered areas of ground-glass attenuation identified bilaterally. 4 mm right middle lobe lung nodule is unchanged, image 50/6. Also in the right middle lobe is a stable 4 mm nodule, image 52/6. Stable subpleural nodule within the posterior left lower lobe measuring 5 mm, image 78/6. Calcified granuloma noted within the right apex. Upper Abdomen: No acute findings.  1.7 cm gallstone. Musculoskeletal: Thoracic degenerative disc disease. Review of the MIP images confirms the above findings. IMPRESSION: 1. No evidence for acute pulmonary embolus. 2. Several pulmonary nodules are identified which appear unchanged from 10/23/2019.  These are all 5 mm or less and compatible with a benign process. 3. Similar appearance subsegmental areas of scarring with volume loss in the right lung. 4. Gallstones. 5. Aortic Atherosclerosis (ICD10-I70.0). Coronary artery calcifications. Electronically Signed   By: Kerby Moors M.D.   On: 10/18/2020 05:49    EKG: Independently reviewed. Sinus rhythm, no st segment elevation or depression, normal qrs duration, underlying rhythm is regular, normal axis, rate of 87.  Assessment/Plan Principle problem:   Atypical chest pain -pain that is constant, sharply achy, originating epigastric and sternal, radiating to the left chest and arm, decreased with rest, positioning, and shallow breaths, worsened with exertion and normal or deep inspiration. Cardiology consulted. -high sensitivity troponins normal -chest cta without PE finding -echocardiogram completed for chance of pericardial effusion, awaiting results, no rubs appreciated on auscletation -chest ct is without traumatic findings, does show gallstone present on previous ct  Active problems:  Essential Hypertension controlled - continue carvedilol 12.5mg  bid - continue irbesartan 300mg  qd -currently moderately  hypertensive, probably due to pain, also have not had morning medications  Aortic stenosis -2/6 systolic ejection murmur at the right upper sternal border, radiates to the carotids, pt denies syncope, 2d echocardiogram performed and awaiting results  Interstitial Lung Disease -follows with pulmonology, currently his lung volume is diminished due to pain, but otherwise clear lung sounds, chest ct revealed no new lung findings from last chest ct  Hyperlipidemia - continue rosuvastatin 20mg   -consider cmp in the event of suspicion of elevated liver enzymes, if so, hold.  Overweight BMI 29.79    Code Status: full Disposition Plan: observation for 24 hours and if no worsening, he may follow up with his pcp Consults called: Cardiology, Carlyle Dolly MD. Admission status: obs  Severity of Illness: The appropriate patient status for this patient is OBSERVATION. Observation status is judged to be reasonable and necessary in order to provide the required intensity of service to ensure the patient's safety. The patient's presenting symptoms, physical exam findings, and initial radiographic and laboratory data in the context of their medical condition is felt to place them at decreased risk for further clinical deterioration. Furthermore, it is anticipated that the patient will be medically stable for discharge from the hospital within 2 midnights of admission. The following factors support the patient status of observation.   " The patient's presenting symptoms include sternal pain with radiation to the left chest and arm, no nausea or vomiting, reports shortness of breath with exertion " The physical exam findings include clear and shallow breath sounds, ruq tenderness to deep palpation, no chest tenderness to palpation, 'chest pain' provoked with ruq palpation, extremities unremarkable,  " The initial radiographic and laboratory data are chest CTA without findings of PE, negative troponins, no  laboratory sign of infection,    Nioma Mccubbins D Arlyn Bumpus student [facility]  If 7PM-7AM, please contact night-coverage [email or web address] Password [facility]  10/18/2020, 10:24 AM

## 2020-10-18 NOTE — Discharge Summary (Signed)
Physician Discharge Summary  Shane Osborne PYK:[id number] DOB: [birth year] DOA: 10/18/2020  PCP: Glenda Chroman, MD  Admit date: 10/18/2020  Discharge date: 10/18/2020  Admitted From:Home  Disposition:  Home  Recommendations for Outpatient Follow-up:  Follow up with PCP in 1-2 weeks Continue on home medications as prior  Home Health: None  Equipment/Devices: None  Discharge Condition:Stable  CODE STATUS: Full  Diet recommendation: Heart Healthy  Brief/Interim Summary:  Shane Osborne is a 80 y.o. male with medical history significant of hypertension, aortic valve stenosis, gerd, hyperlipidemia, and dyspnea on exertion for 'several months' which he has been seeing Cardiology. He reports his pain this morning as a constant sharp ache, from epigastrium and sternal area, radiating to the left chest and left arm, without nausea or vomiting. The pain awoke him from sleep at 0100 10/18/20, and taking a deep breath caused him severe pain.  He was admitted for atypical chest pain evaluation that appeared to be pleuritic in nature.  He underwent a PE study with no findings of PE and further work-up for ACS was unrevealing.  He has undergone 2D echocardiogram as well with no significant wall motion abnormalities or changes to his LV function.  He has been seen by cardiology with recommendations to follow-up outpatient as previously scheduled.  He has also ambulated with no significant difficulties or hypoxemia noted.  He is in stable condition for discharge today.  Discharge Diagnoses:  Active Problems:   Atypical chest pain  Principal discharge diagnosis: Atypical chest pain-pleuritic with no sign of ACS.  Discharge Instructions  Discharge Instructions     Diet - low sodium heart healthy   Complete by: As directed    Increase activity slowly   Complete by: As directed       Allergies as of 10/18/2020       Reactions   Other    [place] nuts - break out in hive throat swelling     Sulfa Antibiotics Rash   Vancomycin Rash        Medication List     STOP taking these medications    nystatin powder Commonly known as: MYCOSTATIN/NYSTOP   pantoprazole 40 MG tablet Commonly known as: PROTONIX       TAKE these medications    albuterol 108 (90 Base) MCG/ACT inhaler Commonly known as: VENTOLIN HFA Inhale 2 puffs into the lungs every 6 (six) hours as needed for wheezing or shortness of breath.   betamethasone valerate lotion 0.1 % Commonly known as: VALISONE Apply 1 application topically daily as needed for irritation.   carvedilol 12.5 MG tablet Commonly known as: COREG Take 1 tablet (12.5 mg total) by mouth 2 (two) times daily.   clotrimazole-betamethasone cream Commonly known as: LOTRISONE Apply 1 application topically 2 (two) times daily.   Fish Oil 1200 MG Caps Take 1 capsule by mouth 2 (two) times daily before a meal.   fluticasone 0.05 % cream Commonly known as: CUTIVATE Apply 1 application topically daily as needed (irritation).   furosemide 20 MG tablet Commonly known as: LASIX Take 20 mg by mouth daily as needed.   irbesartan 300 MG tablet Commonly known as: AVAPRO TAKE 1 TABLET BY MOUTH EVERY DAY   ketoconazole 2 % cream Commonly known as: NIZORAL Apply 1 application topically 2 (two) times daily as needed for irritation.   multivitamin tablet Take 1 tablet by mouth daily.   potassium chloride SA 20 MEQ tablet Commonly known as: KLOR-CON Take 20 mEq by mouth daily.  PROBIOTIC PO Take 1 capsule by mouth daily.   rosuvastatin 20 MG tablet Commonly known as: CRESTOR Take 20 mg by mouth daily.        Follow-up Information     Vyas, Costella Hatcher, MD. Schedule an appointment as soon as possible for a visit.   Specialty: Internal Medicine Contact information: [place] [address] [phone]                Allergies  Allergen Reactions   Other     [place] nuts - break out in hive throat swelling     Sulfa Antibiotics Rash   Vancomycin Rash    Consultations: Cardiology   Procedures/Studies: DG Chest 2 View  Result Date: 10/18/2020 CLINICAL DATA:  Chest pain. Sudden substernal chest pain with left arm and jaw pain. EXAM: CHEST - 2 VIEW COMPARISON:  Chest radiograph 03/20/2016.  CT chest 10/23/2019 FINDINGS: Shallow inspiration. Atelectasis in both lung bases. Heart size and pulmonary vascularity are normal for technique. No definite consolidation, effusion, or pneumothorax. Colonic interposition under the right hemidiaphragm. Calcification of the aorta. Degenerative changes in the spine and shoulders. IMPRESSION: Shallow inspiration with atelectasis in the lung bases. Electronically Signed   By: Lucienne Capers M.D.   On: 10/18/2020 03:26   CT Angio Chest PE W and/or Wo Contrast  Result Date: 10/18/2020 CLINICAL DATA:  PE. Sub sternal chest pain that awoke patient from sleep. Also complains of pain in the right arm. EXAM: CT ANGIOGRAPHY CHEST WITH CONTRAST TECHNIQUE: Multidetector CT imaging of the chest was performed using the standard protocol during bolus administration of intravenous contrast. Multiplanar CT image reconstructions and MIPs were obtained to evaluate the vascular anatomy. CONTRAST:  138mL OMNIPAQUE IOHEXOL 350 MG/ML SOLN COMPARISON:  CT chest 10/23/2019 FINDINGS: Cardiovascular: Satisfactory opacification of the pulmonary arteries to the segmental level. No evidence of pulmonary embolism. Aortic atherosclerosis. Coronary artery calcifications. Heart size normal. No pericardial effusion. Normal heart size. No pericardial effusion. Mediastinum/Nodes: No enlarged mediastinal, hilar, or axillary lymph nodes. Thyroid gland, trachea, and esophagus demonstrate no significant findings. Lungs/Pleura: No pleural effusion. Areas of subsegmental scarring with volume loss are noted within the right upper lobe, right middle lobe and right lower lobe. Dependent changes are noted within the  posterior lung bases. Mild scattered areas of ground-glass attenuation identified bilaterally. 4 mm right middle lobe lung nodule is unchanged, image 50/6. Also in the right middle lobe is a stable 4 mm nodule, image 52/6. Stable subpleural nodule within the posterior left lower lobe measuring 5 mm, image 78/6. Calcified granuloma noted within the right apex. Upper Abdomen: No acute findings.  1.7 cm gallstone. Musculoskeletal: Thoracic degenerative disc disease. Review of the MIP images confirms the above findings. IMPRESSION: 1. No evidence for acute pulmonary embolus. 2. Several pulmonary nodules are identified which appear unchanged from 10/23/2019. These are all 5 mm or less and compatible with a benign process. 3. Similar appearance subsegmental areas of scarring with volume loss in the right lung. 4. Gallstones. 5. Aortic Atherosclerosis (ICD10-I70.0). Coronary artery calcifications. Electronically Signed   By: Kerby Moors M.D.   On: 10/18/2020 05:49   ECHOCARDIOGRAM LIMITED  Result Date: 10/18/2020    ECHOCARDIOGRAM LIMITED REPORT   Patient Name:   Shane Osborne Date of Exam: 10/18/2020 Medical Rec #:  [id number]     Height:       69.0 in Accession #:    [id number]    Weight:  203.7 lb Date of Birth:  Jun 01, 1940    BSA:          2.082 m Patient Age:    80 years      BP:           147/87 mmHg Patient Gender: M             HR:           87 bpm. Exam Location:  Inpatient Procedure: Limited Echo, Cardiac Doppler and Color Doppler Indications:    R07.9* Chest pain, unspecified  History:        Patient has prior history of Echocardiogram examinations, most                 recent 05/23/2020. Risk Factors:Hypertension and Dyslipidemia.  Sonographer:    Maudry Mayhew MHA, RDMS, RVT, RDCS Referring Phys: [id number] Royanne Foots [place]  1. Left ventricular ejection fraction, by estimation, is 60 to 65%. The left ventricle has normal function. The left ventricle has no regional wall motion  abnormalities.  2. Right ventricular systolic function is normal. The right ventricular size is normal.  3. The aortic valve was not well visualized.  4. Limited echo to evaluate LV function FINDINGS  Left Ventricle: Left ventricular ejection fraction, by estimation, is 60 to 65%. The left ventricle has normal function. The left ventricle has no regional wall motion abnormalities. Right Ventricle: The right ventricular size is normal. Right vetricular wall thickness was not well visualized. Right ventricular systolic function is normal. Pericardium: There is no evidence of pericardial effusion. Aortic Valve: The aortic valve was not well visualized. LEFT VENTRICLE PLAX 2D LVIDd:         3.40 cm LVIDs:         2.40 cm LV PW:         0.60 cm LV IVS:        0.60 cm  LV Volumes (MOD) LV vol d, MOD A2C: 45.5 ml LV vol d, MOD A4C: 47.6 ml LV vol s, MOD A2C: 16.1 ml LV vol s, MOD A4C: 17.3 ml LV SV MOD A2C:     29.4 ml LV SV MOD A4C:     47.6 ml LV SV MOD BP:      28.7 ml MITRAL VALVE MV Area (PHT): 2.63 cm MV Decel Time: 288 msec MV E velocity: 109.00 cm/s MV A velocity: 137.00 cm/s MV E/A ratio:  0.80 Carlyle Dolly MD Electronically signed by Carlyle Dolly MD Signature Date/Time: 10/18/2020/11:55:43 AM    Final      Discharge Exam: Vitals:   10/18/20 0932 10/18/20 1300  BP: (!) 147/87 111/69  Pulse: 98 81  Resp: 18 18  Temp: 98 F (36.7 C) 98.7 F (37.1 C)  SpO2: 99% 99%   Vitals:   10/18/20 0853 10/18/20 0930 10/18/20 0932 10/18/20 1300  BP: 132/78  (!) 147/87 111/69  Pulse: 97  98 81  Resp: (!) 26  18 18   Temp: 100.1 F (37.8 C)  98 F (36.7 C) 98.7 F (37.1 C)  TempSrc:   Oral Oral  SpO2: 100%  99% 99%  Weight:  91.5 kg    Height:  5\' 9"  (1.753 m)      General: Pt is alert, awake, not in acute distress Cardiovascular: RRR, S1/S2 +, no rubs, no gallops Respiratory: CTA bilaterally, no wheezing, no rhonchi Abdominal: Soft, NT, ND, bowel sounds + Extremities: no edema, no  cyanosis    The results of significant diagnostics  from this hospitalization (including imaging, microbiology, ancillary and laboratory) are listed below for reference.     Microbiology: Recent Results (from the past 240 hour(s))  Resp Panel by RT-PCR (Flu A&B, Covid) Nasopharyngeal Swab     Status: None   Collection Time: 10/18/20  6:42 AM   Specimen: Nasopharyngeal Swab; Nasopharyngeal(NP) swabs in vial transport medium  Result Value Ref Range Status   SARS Coronavirus 2 by RT PCR NEGATIVE NEGATIVE Final    Comment: (NOTE) SARS-CoV-2 target nucleic acids are NOT DETECTED.  The SARS-CoV-2 RNA is generally detectable in upper respiratory specimens during the acute phase of infection. The lowest concentration of SARS-CoV-2 viral copies this assay can detect is 138 copies/mL. A negative result does not preclude SARS-Cov-2 infection and should not be used as the sole basis for treatment or other patient management decisions. A negative result may occur with  improper specimen collection/handling, submission of specimen other than nasopharyngeal swab, presence of viral mutation(s) within the areas targeted by this assay, and inadequate number of viral copies(<138 copies/mL). A negative result must be combined with clinical observations, patient history, and epidemiological information. The expected result is Negative.  Fact Sheet for Patients:  EntrepreneurPulse.com.au  Fact Sheet for Healthcare Providers:  IncredibleEmployment.be  This test is no t yet approved or cleared by the [place] FDA and  has been authorized for detection and/or diagnosis of SARS-CoV-2 by FDA under an Emergency Use Authorization (EUA). This EUA will remain  in effect (meaning this test can be used) for the duration of the COVID-19 declaration under Section 564(b)(1) of the Act, 21 U.S.C.section 360bbb-3(b)(1), unless the authorization is terminated  or revoked  sooner.       Influenza A by PCR NEGATIVE NEGATIVE Final   Influenza B by PCR NEGATIVE NEGATIVE Final    Comment: (NOTE) The Xpert Xpress SARS-CoV-2/FLU/RSV plus assay is intended as an aid in the diagnosis of influenza from Nasopharyngeal swab specimens and should not be used as a sole basis for treatment. Nasal washings and aspirates are unacceptable for Xpert Xpress SARS-CoV-2/FLU/RSV testing.  Fact Sheet for Patients: EntrepreneurPulse.com.au  Fact Sheet for Healthcare Providers: IncredibleEmployment.be  This test is not yet approved or cleared by the [place] FDA and has been authorized for detection and/or diagnosis of SARS-CoV-2 by FDA under an Emergency Use Authorization (EUA). This EUA will remain in effect (meaning this test can be used) for the duration of the COVID-19 declaration under Section 564(b)(1) of the Act, 21 U.S.C. section 360bbb-3(b)(1), unless the authorization is terminated or revoked.  Performed at [facility], [address] [place], [place] [address]      Labs: BNP (last 3 results) No results for input(s): BNP in the last 8760 hours. Basic Metabolic Panel: Recent Labs  Lab 10/18/20 0335  NA 139  K 3.8  CL 106  CO2 25  GLUCOSE 94  BUN 9  CREATININE 1.01  CALCIUM 8.7*   Liver Function Tests: No results for input(s): AST, ALT, ALKPHOS, BILITOT, PROT, ALBUMIN in the last 168 hours. No results for input(s): LIPASE, AMYLASE in the last 168 hours. No results for input(s): AMMONIA in the last 168 hours. CBC: Recent Labs  Lab 10/18/20 0335  WBC 7.9  HGB 14.6  HCT 44.0  MCV 98.4  PLT 215   Cardiac Enzymes: No results for input(s): CKTOTAL, CKMB, CKMBINDEX, TROPONINI in the last 168 hours. BNP: Invalid input(s): POCBNP CBG: No results for input(s): GLUCAP in the last 168 hours. D-Dimer No results  for input(s): DDIMER in the last 72 hours. Hgb A1c No results for input(s): HGBA1C in the last  72 hours. Lipid Profile No results for input(s): CHOL, HDL, LDLCALC, TRIG, CHOLHDL, LDLDIRECT in the last 72 hours. Thyroid function studies No results for input(s): TSH, T4TOTAL, T3FREE, THYROIDAB in the last 72 hours.  Invalid input(s): FREET3 Anemia work up No results for input(s): VITAMINB12, FOLATE, FERRITIN, TIBC, IRON, RETICCTPCT in the last 72 hours. Urinalysis    Component Value Date/Time   APPEARANCEUR Clear 08/07/2020 1130   GLUCOSEU Negative 08/07/2020 1130   BILIRUBINUR Negative 08/07/2020 1130   PROTEINUR Negative 08/07/2020 1130   NITRITE Negative 08/07/2020 1130   LEUKOCYTESUR Negative 08/07/2020 1130   Sepsis Labs Invalid input(s): PROCALCITONIN,  WBC,  LACTICIDVEN Microbiology Recent Results (from the past 240 hour(s))  Resp Panel by RT-PCR (Flu A&B, Covid) Nasopharyngeal Swab     Status: None   Collection Time: 10/18/20  6:42 AM   Specimen: Nasopharyngeal Swab; Nasopharyngeal(NP) swabs in vial transport medium  Result Value Ref Range Status   SARS Coronavirus 2 by RT PCR NEGATIVE NEGATIVE Final    Comment: (NOTE) SARS-CoV-2 target nucleic acids are NOT DETECTED.  The SARS-CoV-2 RNA is generally detectable in upper respiratory specimens during the acute phase of infection. The lowest concentration of SARS-CoV-2 viral copies this assay can detect is 138 copies/mL. A negative result does not preclude SARS-Cov-2 infection and should not be used as the sole basis for treatment or other patient management decisions. A negative result may occur with  improper specimen collection/handling, submission of specimen other than nasopharyngeal swab, presence of viral mutation(s) within the areas targeted by this assay, and inadequate number of viral copies(<138 copies/mL). A negative result must be combined with clinical observations, patient history, and epidemiological information. The expected result is Negative.  Fact Sheet for Patients:   EntrepreneurPulse.com.au  Fact Sheet for Healthcare Providers:  IncredibleEmployment.be  This test is no t yet approved or cleared by the [place] FDA and  has been authorized for detection and/or diagnosis of SARS-CoV-2 by FDA under an Emergency Use Authorization (EUA). This EUA will remain  in effect (meaning this test can be used) for the duration of the COVID-19 declaration under Section 564(b)(1) of the Act, 21 U.S.C.section 360bbb-3(b)(1), unless the authorization is terminated  or revoked sooner.       Influenza A by PCR NEGATIVE NEGATIVE Final   Influenza B by PCR NEGATIVE NEGATIVE Final    Comment: (NOTE) The Xpert Xpress SARS-CoV-2/FLU/RSV plus assay is intended as an aid in the diagnosis of influenza from Nasopharyngeal swab specimens and should not be used as a sole basis for treatment. Nasal washings and aspirates are unacceptable for Xpert Xpress SARS-CoV-2/FLU/RSV testing.  Fact Sheet for Patients: EntrepreneurPulse.com.au  Fact Sheet for Healthcare Providers: IncredibleEmployment.be  This test is not yet approved or cleared by the [place] FDA and has been authorized for detection and/or diagnosis of SARS-CoV-2 by FDA under an Emergency Use Authorization (EUA). This EUA will remain in effect (meaning this test can be used) for the duration of the COVID-19 declaration under Section 564(b)(1) of the Act, 21 U.S.C. section 360bbb-3(b)(1), unless the authorization is terminated or revoked.  Performed at [facility], [address], [place], [place] [address]      Time coordinating discharge: 35 minutes  SIGNED:   Rodena Goldmann, DO [facility] 10/18/2020, 3:17 PM  If 7PM-7AM, please contact night-coverage [email or web address]

## 2020-10-18 NOTE — ED Provider Notes (Signed)
[facility] EMERGENCY DEPARTMENT Provider Note   CSN: [id number] Arrival date & time: 10/18/20  0253     History Chief Complaint  Patient presents with   Chest Pain    Shane Osborne is a 80 y.o. male.  Patient is an 80 year old male with past medical history of GERD, hypertension, hyperlipidemia, coronary atherosclerosis which was mild at the time of his last heart cath in 2018.  Patient presenting today with complaints of chest discomfort.  He went to bed this evening feeling well, then woke from sleep with sharp pain to the front of his chest radiating to his left shoulder.  He feels short of breath and the pain is worse when he attempts to take a deep breath.  He received nitroglycerin and aspirin by EMS with no relief.  He denies any leg pain or swelling.  He denies fevers, chills, or productive cough.  The history is provided by the patient.  Chest Pain Pain location:  Substernal area Pain quality: sharp   Pain radiates to:  L shoulder Pain severity:  Moderate Onset quality:  Sudden Duration:  2 hours Timing:  Constant Progression:  Unchanged Chronicity:  New Context: breathing   Relieved by:  Nothing Worsened by:  Certain positions and deep breathing Ineffective treatments:  Nitroglycerin and aspirin     Past Medical History:  Diagnosis Date   Aortic stenosis    Childhood asthma    Coronary atherosclerosis    Mild at cardiac catheterization 2018   Essential hypertension    GERD (gastroesophageal reflux disease)    Hyperlipidemia     Patient Active Problem List   Diagnosis Date Noted   ILD (interstitial lung disease) ([place]) 09/29/2019   Atrial fibrillation ([place]) 04/19/2017   Upper airway cough syndrome 08/19/2016   Dyspnea on exertion 05/19/2016   Morbid obesity due to excess calories ([place]) 05/19/2016   Abnormal nuclear cardiac imaging test 03/23/2016   Abnormal nuclear stress test    Heme positive stool 08/22/2013   Hyperlipidemia 02/27/2009   Essential  hypertension 02/27/2009   CHEST PAIN UNSPECIFIED 02/27/2009    Past Surgical History:  Procedure Laterality Date   BIOPSY  07/27/2019   Procedure: BIOPSY;  Surgeon: Rogene Houston, MD;  [facility]: [facility];  Service: Endoscopy;;   COLONOSCOPY  10/01/2010   Procedure: COLONOSCOPY;  Surgeon: Rogene Houston, MD;  [facility]: [facility];  Service: Endoscopy;  Laterality: N/A;   COLONOSCOPY N/A 09/01/2013   Procedure: COLONOSCOPY;  Surgeon: Rogene Houston, MD;  [facility];  Service: Endoscopy;  Laterality: N/A;  940   COLONOSCOPY N/A 07/27/2019   Procedure: COLONOSCOPY;  Surgeon: Rogene Houston, MD;  [facility];  Service: Endoscopy;  Laterality: N/A;  1030   ESOPHAGOGASTRODUODENOSCOPY     ESOPHAGOGASTRODUODENOSCOPY N/A 04/11/2014   Procedure: ESOPHAGOGASTRODUODENOSCOPY (EGD);  Surgeon: Rogene Houston, MD;  [facility]: [facility];  Service: Endoscopy;  Laterality: N/A;  1200 - moved to 12:55 - Ann to notify pt   ESOPHAGOGASTRODUODENOSCOPY N/A 07/27/2019   Procedure: ESOPHAGOGASTRODUODENOSCOPY (EGD);  Surgeon: Rogene Houston, MD;  [facility];  Service: Endoscopy;  Laterality: N/A;   LEFT HEART CATH AND CORONARY ANGIOGRAPHY N/A 03/23/2016   Procedure: Left Heart Cath and Coronary Angiography;  Surgeon: Sherren Mocha, MD;  Location: [place] [facility];  Service: Cardiovascular;  Laterality: N/A;   POLYPECTOMY  07/27/2019   Procedure: POLYPECTOMY;  Surgeon: Rogene Houston, MD;  [facility];  Service: Endoscopy;;   right leg  20 years ago   X 8 secondary to fracture   rt inguinal      hernia repair 2 months ago       Family History  Problem Relation Age of Onset   Cancer Mother    Heart attack Father     Social History   Tobacco Use   Smoking status: Never   Smokeless tobacco: Never  Substance Use Topics   Alcohol use: Yes    Alcohol/week: 5.0 standard drinks    Types: 5 Shots of liquor per week    Comment: 2 oz in the  afternoon   Drug use: No    Home Medications Prior to Admission medications   Medication Sig Start Date End Date Taking? Authorizing Provider  albuterol (VENTOLIN HFA) 108 (90 Base) MCG/ACT inhaler Inhale 2 puffs into the lungs every 6 (six) hours as needed for wheezing or shortness of breath. 11/21/19   Rigoberto Noel, MD  betamethasone valerate lotion (VALISONE) 0.1 % APPLY TO THE AFFECTED AREA(S) TWICE DAILY 02/27/19   [provider]  carvedilol (COREG) 12.5 MG tablet Take 1 tablet (12.5 mg total) by mouth 2 (two) times daily. 05/31/20   Satira Sark, MD  clotrimazole-betamethasone (LOTRISONE) cream Apply 1 application topically 2 (two) times daily. 08/07/20   McKenzie, Candee Furbish, MD  fluticasone (CUTIVATE) 0.05 % cream APPLY TO THE AFFECTED AREA(S) UP TO TWICE DAILY AS NEEDED - mix with ketoconazole cream 07/03/19   [provider]  furosemide (LASIX) 20 MG tablet Take 20 mg by mouth daily as needed.    [provider]  irbesartan (AVAPRO) 300 MG tablet TAKE 1 TABLET BY MOUTH EVERY DAY 01/12/20   Verta Ellen., NP  ketoconazole (NIZORAL) 2 % cream APPLY TO THE AFFECTED AREA(S) UP TO TWICE DAILY AS NEEDED - mix with fluticasone cream 07/03/19   [provider]  Multiple Vitamin (MULTIVITAMIN) tablet Take 1 tablet by mouth daily.    [provider]  nystatin (MYCOSTATIN/NYSTOP) powder SMARTSIG:3 Gram(s) Topical Twice Daily 08/07/20   McKenzie, Candee Furbish, MD  Omega-3 Fatty Acids (FISH OIL) 1200 MG CAPS Take 1 capsule by mouth 2 (two) times daily before a meal.     [provider]  pantoprazole (PROTONIX) 40 MG tablet Take 1 tablet (40 mg total) by mouth 2 (two) times daily before a meal. 07/27/19   Rehman, Mechele Dawley, MD  potassium chloride SA (KLOR-CON) 20 MEQ tablet Take 20 mEq by mouth daily.    [provider]  Probiotic Product (PROBIOTIC PO) Take 1 capsule by mouth daily.     [provider]  rosuvastatin (CRESTOR) 20  MG tablet Take 20 mg by mouth daily.    [provider]    Allergies    Other, Sulfa antibiotics, and Vancomycin  Review of Systems   Review of Systems  Cardiovascular:  Positive for chest pain.  All other systems reviewed and are negative.  Physical Exam Updated Vital Signs BP (!) 142/88 (BP Location: Right Arm)   Pulse 78   Temp 98.7 F (37.1 C) (Oral)   Resp 20   Ht 5\' 9"  (1.753 m)   Wt 92.4 kg   SpO2 97%   BMI 30.08 kg/m   Physical Exam Vitals and nursing note reviewed.  Constitutional:      General: He is not in acute distress.    Appearance: He is well-developed. He is not diaphoretic.  HENT:  Head: Normocephalic and atraumatic.  Cardiovascular:     Rate and Rhythm: Normal rate and regular rhythm.     Heart sounds: No murmur heard.   No friction rub.  Pulmonary:     Effort: Pulmonary effort is normal. No respiratory distress.     Breath sounds: Normal breath sounds. No wheezing or rales.  Abdominal:     General: Bowel sounds are normal. There is no distension.     Palpations: Abdomen is soft.     Tenderness: There is no abdominal tenderness.  Musculoskeletal:        General: Normal range of motion.     Cervical back: Normal range of motion and neck supple.     Right lower leg: No tenderness. No edema.     Left lower leg: No tenderness. No edema.     Comments: There is no calf tenderness.  Shane Osborne' sign is absent bilaterally.  Skin:    General: Skin is warm and dry.  Neurological:     Mental Status: He is alert and oriented to person, place, and time.     Coordination: Coordination normal.    ED Results / Procedures / Treatments   Labs (all labs ordered are listed, but only abnormal results are displayed) Labs Reviewed  CBC  BASIC METABOLIC PANEL  TROPONIN I (HIGH SENSITIVITY)    EKG None  Radiology DG Chest 2 View  Result Date: 10/18/2020 CLINICAL DATA:  Chest pain. Sudden substernal chest pain with left arm and jaw pain. EXAM:  CHEST - 2 VIEW COMPARISON:  Chest radiograph 03/20/2016.  CT chest 10/23/2019 FINDINGS: Shallow inspiration. Atelectasis in both lung bases. Heart size and pulmonary vascularity are normal for technique. No definite consolidation, effusion, or pneumothorax. Colonic interposition under the right hemidiaphragm. Calcification of the aorta. Degenerative changes in the spine and shoulders. IMPRESSION: Shallow inspiration with atelectasis in the lung bases. Electronically Signed   By: Lucienne Capers M.D.   On: 10/18/2020 03:26    Procedures Procedures   Medications Ordered in ED Medications  morphine 4 MG/ML injection 4 mg (has no administration in time range)  ondansetron (ZOFRAN) injection 4 mg (has no administration in time range)  sodium chloride 0.9 % bolus 500 mL (has no administration in time range)    ED Course  I have reviewed the triage vital signs and the nursing notes.  Pertinent labs & imaging results that were available during my care of the patient were reviewed by me and considered in my medical decision making (see chart for details).    MDM Rules/Calculators/A&P  Patient is an 80 year old male with past medical history as per HPI.  He presents with chest pain that woke him from sleep this evening.  He describes pain to the center of his chest radiating to the left shoulder.  The pain is worse when he attempts to take a deep breath.  Patient's initial EKG is unchanged and shows no acute findings.  Troponin x2 is negative.  Due to the pleuritic nature of his pain, a CTA was obtained showing no sign of pulmonary embolism or other pathology that would explain his discomfort.  Patient continues with discomfort despite being treated with morphine.  I feel as though admission observation is appropriate.  I spoke with Dr. Josephine Cables who agrees to admit.  Final Clinical Impression(s) / ED Diagnoses Final diagnoses:  None    Rx / DC Orders ED Discharge Orders     None  Veryl Speak, MD 10/18/20 [phone]

## 2020-10-18 NOTE — Consult Note (Addendum)
Cardiology Consultation:   Patient ID: Shane Osborne MRN: [id number]; DOB: [birth year]  Admit date: 10/18/2020 Date of Consult: 10/18/2020  PCP:  Glenda Chroman, MD   [facility] Providers Cardiologist:  Rozann Lesches, MD   Patient Profile:   Shane Osborne is a 80 y.o. male with a hx of chest pain (prior cath in 02/2016 showing mild nonobstructive CAD, low-risk NST in 06/2019), mild to moderate AS, HTN, HLD and GERD who is being seen 10/18/2020 for the evaluation of chest pain at the request of Dr. Josephine Cables.  History of Present Illness:   Shane Osborne was last examined by Dr. Domenic Polite in 05/2020 and reported having baseline dyspnea on exertion but denied any recent chest pain or palpitations.  He was bradycardic with heart rate in the 50's, therefore Coreg was reduced to 12.5 mg twice daily in case chronotropic incompetence was contributing to his dyspnea on exertion.   He presented to [facility] ED this morning for evaluation of chest pain which awoke him from sleep. He reports being in his usual state of health until this morning when around 0130 he developed chest discomfort along his left pectoral region and reports discomfort along his neck and arm. He does have known rotator cuff issues and was unsure if his arm pain was due to this or a cardiac cause. He describes the discomfort as a "ache" which might occur after someone is in a car accident. He says his pain is significantly exacerbated with deep breathing and he felt like he cannot take a deep breath. No association with exertion or positional changes. He did receive Morphine and feels like this helped some but his pain has now persisted for 7+ hours. He has baseline dyspnea on exertion but says this has overall been stable. No reported orthopnea, PND or lower extremity edema. No recent viral illnesses.  Initial labs show WBC 7.9, Hgb 14.6, platelets 215, Na+ 139, K+ 3.8 and creatinine 1.01. Initial and repeat Hs Troponin values have  been negative at 8 and 7. COVID pending.CXR showing atelectasis. CTA showing no evidence of a PE but noted to have several pulmonary nodules which appear unchanged from prior imaging in 09/2019. Also showing aortic atherosclerosis and coronary artery calcifications along with gallstones. EKG shows NSR, HR 87 with 1st degree AV block and PAC's. Artifact noted but no definitive ST changes.    Past Medical History:  Diagnosis Date   Aortic stenosis    Childhood asthma    Coronary atherosclerosis    Mild at cardiac catheterization 2018   Essential hypertension    GERD (gastroesophageal reflux disease)    Hyperlipidemia     Past Surgical History:  Procedure Laterality Date   BIOPSY  07/27/2019   Procedure: BIOPSY;  Surgeon: Rogene Houston, MD;  [facility];  Service: Endoscopy;;   COLONOSCOPY  10/01/2010   Procedure: COLONOSCOPY;  Surgeon: Rogene Houston, MD;  [facility];  Service: Endoscopy;  Laterality: N/A;   COLONOSCOPY N/A 09/01/2013   Procedure: COLONOSCOPY;  Surgeon: Rogene Houston, MD;  [facility];  Service: Endoscopy;  Laterality: N/A;  940   COLONOSCOPY N/A 07/27/2019   Procedure: COLONOSCOPY;  Surgeon: Rogene Houston, MD;  [facility];  Service: Endoscopy;  Laterality: N/A;  1030   ESOPHAGOGASTRODUODENOSCOPY     ESOPHAGOGASTRODUODENOSCOPY N/A 04/11/2014   Procedure: ESOPHAGOGASTRODUODENOSCOPY (EGD);  Surgeon: Rogene Houston, MD;  [facility];  Service: Endoscopy;  Laterality: N/A;  1200 - moved to 12:55 - Ann to notify pt   ESOPHAGOGASTRODUODENOSCOPY N/A 07/27/2019   Procedure: ESOPHAGOGASTRODUODENOSCOPY (EGD);  Surgeon: Rogene Houston, MD;  [facility];  Service: Endoscopy;  Laterality: N/A;   LEFT HEART CATH AND CORONARY ANGIOGRAPHY N/A 03/23/2016   Procedure: Left Heart Cath and Coronary Angiography;  Surgeon: Sherren Mocha, MD;  Location: [facility];  Service: Cardiovascular;  Laterality: N/A;    POLYPECTOMY  07/27/2019   Procedure: POLYPECTOMY;  Surgeon: Rogene Houston, MD;  [facility];  Service: Endoscopy;;   right leg  20 years ago   X 8 secondary to fracture   rt inguinal      hernia repair 2 months ago     Home Medications:  Prior to Admission medications   Medication Sig Start Date End Date Taking? Authorizing Provider  albuterol (VENTOLIN HFA) 108 (90 Base) MCG/ACT inhaler Inhale 2 puffs into the lungs every 6 (six) hours as needed for wheezing or shortness of breath. 11/21/19   Rigoberto Noel, MD  betamethasone valerate lotion (VALISONE) 0.1 % APPLY TO THE AFFECTED AREA(S) TWICE DAILY 02/27/19   [provider]  carvedilol (COREG) 12.5 MG tablet Take 1 tablet (12.5 mg total) by mouth 2 (two) times daily. 05/31/20   Satira Sark, MD  clotrimazole-betamethasone (LOTRISONE) cream Apply 1 application topically 2 (two) times daily. 08/07/20   McKenzie, Candee Furbish, MD  fluticasone (CUTIVATE) 0.05 % cream APPLY TO THE AFFECTED AREA(S) UP TO TWICE DAILY AS NEEDED - mix with ketoconazole cream 07/03/19   [provider]  furosemide (LASIX) 20 MG tablet Take 20 mg by mouth daily as needed.    [provider]  irbesartan (AVAPRO) 300 MG tablet TAKE 1 TABLET BY MOUTH EVERY DAY 01/12/20   Verta Ellen., NP  ketoconazole (NIZORAL) 2 % cream APPLY TO THE AFFECTED AREA(S) UP TO TWICE DAILY AS NEEDED - mix with fluticasone cream 07/03/19   [provider]  Multiple Vitamin (MULTIVITAMIN) tablet Take 1 tablet by mouth daily.    [provider]  nystatin (MYCOSTATIN/NYSTOP) powder SMARTSIG:3 Gram(s) Topical Twice Daily 08/07/20   McKenzie, Candee Furbish, MD  Omega-3 Fatty Acids (FISH OIL) 1200 MG CAPS Take 1 capsule by mouth 2 (two) times daily before a meal.     [provider]  pantoprazole (PROTONIX) 40 MG tablet Take 1 tablet (40 mg total) by mouth 2 (two) times daily before a meal. 07/27/19   Rehman, Mechele Dawley, MD  potassium  chloride SA (KLOR-CON) 20 MEQ tablet Take 20 mEq by mouth daily.    [provider]  Probiotic Product (PROBIOTIC PO) Take 1 capsule by mouth daily.     [provider]  rosuvastatin (CRESTOR) 20 MG tablet Take 20 mg by mouth daily.    [provider]    Inpatient Medications: Scheduled Meds:  Continuous Infusions:  PRN Meds:   Allergies:    Allergies  Allergen Reactions   Other     Brazilian nuts - break out in hive throat swelling    Sulfa Antibiotics Rash   Vancomycin Rash    Social History:   Social History   Socioeconomic History   Marital status: Married    Spouse name: Not on file   Number of children: Not on file   Years of education: Not on file   Highest education level: Not on file  Occupational History   Not on file  Tobacco Use  Smoking status: Never   Smokeless tobacco: Never  Substance and Sexual Activity   Alcohol use: Yes    Alcohol/week: 5.0 standard drinks    Types: 5 Shots of liquor per week    Comment: 2 oz in the afternoon   Drug use: No   Sexual activity: Not on file  Other Topics Concern   Not on file  Social History Narrative   Not on file   Social Determinants of Health   Financial Resource Strain: Not on file  Food Insecurity: Not on file  Transportation Needs: Not on file  Physical Activity: Not on file  Stress: Not on file  Social Connections: Not on file  Intimate Partner Violence: Not on file    Family History:    Family History  Problem Relation Age of Onset   Cancer Mother    Heart attack Father      ROS:  Please see the history of present illness.   All other ROS reviewed and negative.     Physical Exam/Data:   Vitals:   10/18/20 0600 10/18/20 0630 10/18/20 0700 10/18/20 0730  BP: (!) 145/96 (!) 142/90 (!) 151/95 (!) 150/81  Pulse: (!) 103 99 (!) 106 (!) 101  Resp: (!) 27 20 (!) 28 (!) 21  Temp:      TempSrc:      SpO2: 100% 100% 100% 99%  Weight:      Height:       No  intake or output data in the 24 hours ending 10/18/20 0829 Last 3 Weights 10/18/2020 08/07/2020 05/31/2020  Weight (lbs) 203 lb 11.3 oz 203 lb 12.8 oz 202 lb 3.2 oz  Weight (kg) 92.4 kg 92.443 kg 91.717 kg     Body mass index is 30.08 kg/m.  General:  Well nourished, well developed, in no acute distress. HEENT: normal Neck: no JVD Vascular: No carotid bruits; Distal pulses 2+ bilaterally Cardiac:  normal S1, S2; RRR with occasional ectopic beats. 2/6 SEM along RUSB.  Lungs:  clear to auscultation bilaterally, no wheezing, rhonchi or rales  Abd: soft, nontender, no hepatomegaly  Ext: no pitting edema Musculoskeletal:  No deformities, BUE and BLE strength normal and equal Skin: warm and dry  Neuro:  CNs 2-12 intact, no focal abnormalities noted Psych:  Normal affect   EKG:  The EKG was personally reviewed and demonstrates: NSR, HR 87 with 1st degree AV block and PAC's. Artifact noted but no definitive ST changes.   Telemetry:  Telemetry was personally reviewed and demonstrates: NSR, HR in 90's to 110's. Occasional PVC's.   Relevant CV Studies:  Cardiac Catheterization: 02/2016 1. Mild CAD with nonobstructive left main and RCA stenosis 2. Calcified coronary arteries without significant stenoses 3. Mildly elevated LVEDP  NST: 06/2019 The study is normal. This is a low risk study. The left ventricular ejection fraction is normal (55-65%). There was no ST segment deviation noted during stress.   Diaphragmatic attenuation no ischemia Low risk study EF 59%  Echocardiogram: 04/2020 IMPRESSIONS     1. Left ventricular ejection fraction, by estimation, is 60 to 65%. The  left ventricle has normal function. The left ventricle has no regional  wall motion abnormalities. Left ventricular diastolic parameters are  consistent with Grade I diastolic  dysfunction (impaired relaxation). The average left ventricular global  longitudinal strain is 18.0 %. The global longitudinal strain is  normal.   2. Right ventricular systolic function is normal. The right ventricular  size is normal.   3. The  mitral valve is normal in structure. Trivial mitral valve  regurgitation. No evidence of mitral stenosis.   4. The aortic valve has an indeterminant number of cusps. There is severe  calcifcation of the aortic valve. There is severe thickening of the aortic  valve. Aortic valve regurgitation is trivial. Mild to moderate aortic  valve stenosis. Aortic valve mean  gradient measures 14.4 mmHg. Aortic valve peak gradient measures 30.5  mmHg. Aortic valve area, by VTI measures 1.26 cm.   Comparison(s): Echocardiogram done 11/22/19 showed an EF of 55-60% with  mild to moderate AS and an AV Peak Grad of 27.9 mmHg.   Laboratory Data:  High Sensitivity Troponin:   Recent Labs  Lab 10/18/20 0335 10/18/20 0515  TROPONINIHS 8 7     Chemistry Recent Labs  Lab 10/18/20 0335  NA 139  K 3.8  CL 106  CO2 25  GLUCOSE 94  BUN 9  CREATININE 1.01  CALCIUM 8.7*  GFRNONAA >60  ANIONGAP 8    No results for input(s): PROT, ALBUMIN, AST, ALT, ALKPHOS, BILITOT in the last 168 hours. Lipids No results for input(s): CHOL, TRIG, HDL, LABVLDL, LDLCALC, CHOLHDL in the last 168 hours.  Hematology Recent Labs  Lab 10/18/20 0335  WBC 7.9  RBC 4.47  HGB 14.6  HCT 44.0  MCV 98.4  MCH 32.7  MCHC 33.2  RDW 13.1  PLT 215   Thyroid No results for input(s): TSH, FREET4 in the last 168 hours.  BNPNo results for input(s): BNP, PROBNP in the last 168 hours.  DDimer No results for input(s): DDIMER in the last 168 hours.   Radiology/Studies:  DG Chest 2 View  Result Date: 10/18/2020 CLINICAL DATA:  Chest pain. Sudden substernal chest pain with left arm and jaw pain. EXAM: CHEST - 2 VIEW COMPARISON:  Chest radiograph 03/20/2016.  CT chest 10/23/2019 FINDINGS: Shallow inspiration. Atelectasis in both lung bases. Heart size and pulmonary vascularity are normal for technique. No definite  consolidation, effusion, or pneumothorax. Colonic interposition under the right hemidiaphragm. Calcification of the aorta. Degenerative changes in the spine and shoulders. IMPRESSION: Shallow inspiration with atelectasis in the lung bases. Electronically Signed   By: Lucienne Capers M.D.   On: 10/18/2020 03:26   CT Angio Chest PE W and/or Wo Contrast  Result Date: 10/18/2020 CLINICAL DATA:  PE. Sub sternal chest pain that awoke patient from sleep. Also complains of pain in the right arm. EXAM: CT ANGIOGRAPHY CHEST WITH CONTRAST TECHNIQUE: Multidetector CT imaging of the chest was performed using the standard protocol during bolus administration of intravenous contrast. Multiplanar CT image reconstructions and MIPs were obtained to evaluate the vascular anatomy. CONTRAST:  167m OMNIPAQUE IOHEXOL 350 MG/ML SOLN COMPARISON:  CT chest 10/23/2019 FINDINGS: Cardiovascular: Satisfactory opacification of the pulmonary arteries to the segmental level. No evidence of pulmonary embolism. Aortic atherosclerosis. Coronary artery calcifications. Heart size normal. No pericardial effusion. Normal heart size. No pericardial effusion. Mediastinum/Nodes: No enlarged mediastinal, hilar, or axillary lymph nodes. Thyroid gland, trachea, and esophagus demonstrate no significant findings. Lungs/Pleura: No pleural effusion. Areas of subsegmental scarring with volume loss are noted within the right upper lobe, right middle lobe and right lower lobe. Dependent changes are noted within the posterior lung bases. Mild scattered areas of ground-glass attenuation identified bilaterally. 4 mm right middle lobe lung nodule is unchanged, image 50/6. Also in the right middle lobe is a stable 4 mm nodule, image 52/6. Stable subpleural nodule within the posterior left lower lobe measuring 5 mm, image  78/6. Calcified granuloma noted within the right apex. Upper Abdomen: No acute findings.  1.7 cm gallstone. Musculoskeletal: Thoracic degenerative  disc disease. Review of the MIP images confirms the above findings. IMPRESSION: 1. No evidence for acute pulmonary embolus. 2. Several pulmonary nodules are identified which appear unchanged from 10/23/2019. These are all 5 mm or less and compatible with a benign process. 3. Similar appearance subsegmental areas of scarring with volume loss in the right lung. 4. Gallstones. 5. Aortic Atherosclerosis (ICD10-I70.0). Coronary artery calcifications. Electronically Signed   By: Kerby Moors M.D.   On: 10/18/2020 05:49     Assessment and Plan:   1. Pleuritic Chest Pain - His pain has now persisted for more than 7 hours and he reports it is significantly exacerbated with deep breathing. No association with exertion. He has undergone prior cardiac work-up in the past with catheterization in 2018 showing mild nonobstructive CAD as outlined above and most recent ischemic evaluation was a low risk NST in 06/2019. - Hs Troponin values have been negative thus far and his EKG shows no definitive ST changes but will ask for a repeat tracing given artifact on his initial one. CTA negative for a PE. He denies any recent viral illnesses but will check ESR and CRP given his pleuritic symptoms. Agree with obtaining a limited echocardiogram to assess LV function, wall motion and to rule out a pericardial effusion. Would not anticipate further ischemic evaluation this admission unless enzymes trend upwards or his echocardiogram shows new abnormalities.  2. Aortic Stenosis - Echocardiogram in 04/2020 showed mild to moderate aortic valve stenosis. Will continue to follow as an outpatient.  3. HTN - BP has been mildly elevated while in the ED, at 150/81 on most recent check.  Would continue his prior to admission medications including Coreg and Irbesartan.  4. HLD - He is on Crestor 20 mg daily as an outpatient with goal LDL less than 70 given known coronary calcifications.    Risk Assessment/Risk Scores:     HEAR  Score (for undifferentiated chest pain):  HEAR Score: 4   For questions or updates, please contact [place] Please consult [email or web address] for contact info under    Signed, Erma Heritage, PA-C  10/18/2020 8:29 AM  Attending note Patient seen and discussed with PA Ahmed Prima, I agree with her documentation. 80 yo male history of nonobstructive CAD by cath in 2018, mild to mod AS, HTN, HL. Mitral regurgitation,    K 3.8 Cr 1.01 BUN 9 WBC 7.9 Hgb 14.6 Plt 215 ESR 18 CRP 0.9 Trop 8-->7-->10 COVID neg CXR no acute process CT PE no PE EKG SR, no ischemic changes  02/2016 cath: no significant CAD 06/2019 nuclear stress: no ischemia 04/2020 echo LVE 60-65%< grade I dd, trivial MR, mild to mod AS mean grad 14.4 AVA VTI 1.26  Presents with noncardiac chest pain. Lasting 9.5 hours constant to this point worst with deep breathing. EKG and enzymes are benign. Extensive testing over the last several years including cath in 2018 without significant coronary disease, 06/2019 nuclear stress without ishcemia. Can f/u limited echo, no plans for repeat ischemic testing at this time   Carlyle Dolly MD

## 2020-10-18 NOTE — Progress Notes (Signed)
Limited 2D echocardiogram complete.  10/18/2020 10:19 AM Kelby Aline., MHA, RVT, RDCS, RDMS

## 2020-10-18 NOTE — ED Triage Notes (Signed)
Pt c/o sudden substernal chest pain that woke him up from his sleep. Pt also c/o pain in left arm and jaw. Pt took ASA at home and EMS gave pt nitro, no improvement with pain.

## 2020-10-18 NOTE — ED Notes (Signed)
Pt ambulated to bathroom independently.  Upon returning pt was noted to be sob.  O2 sats 86% on ra and tachycardic to 124.  Placed back on 2lpm o2 via [place].

## 2020-10-24 DIAGNOSIS — Z23 Encounter for immunization: Secondary | ICD-10-CM | POA: Diagnosis not present

## 2020-10-24 DIAGNOSIS — Z09 Encounter for follow-up examination after completed treatment for conditions other than malignant neoplasm: Secondary | ICD-10-CM | POA: Diagnosis not present

## 2020-10-24 DIAGNOSIS — I350 Nonrheumatic aortic (valve) stenosis: Secondary | ICD-10-CM | POA: Diagnosis not present

## 2020-10-24 DIAGNOSIS — Z299 Encounter for prophylactic measures, unspecified: Secondary | ICD-10-CM | POA: Diagnosis not present

## 2020-10-24 DIAGNOSIS — I700 Atherosclerosis of aorta: Secondary | ICD-10-CM | POA: Diagnosis not present

## 2020-10-24 DIAGNOSIS — R079 Chest pain, unspecified: Secondary | ICD-10-CM | POA: Diagnosis not present

## 2020-10-24 DIAGNOSIS — I10 Essential (primary) hypertension: Secondary | ICD-10-CM | POA: Diagnosis not present

## 2020-10-24 NOTE — Progress Notes (Signed)
Cardiology Office Note  Date: 10/25/2020   ID: Shane Osborne 06/08/1940, MRN [id number]  PCP:  Glenda Chroman, MD  Cardiologist:  Rozann Lesches, MD Electrophysiologist:  None   Chief Complaint: 56-month follow-up/hospital follow-up  History of Present Illness: Shane Osborne is a 80 y.o. male with a history of HTN, atrial fibrillation, ILD, HLD, CAD, GERD.   He was last seen by Dr. Domenic Polite on 05/31/2020.  He reported dyspnea on exertion.  No exertional chest pain or palpitations.  He remained active with ADLs and walked his dog regularly.  Recent echocardiogram EF 60 to 65% with mild diastolic dysfunction, normal RV contraction, overall moderate calcific aortic stenosis.  Had previous reassuring ischemic testing in June 2021.  His medications were reviewed.  There was discussion of reducing Coreg to 12.5 mg p.o. twice daily in case there was a component of symptomatic bradycardia or chronotropic incompetence.   Plan was to continue surveillance of aortic stenosis and repeat echocardiogram the following year.  He was to continue Crestor for mild CAD.  Follow-up Myoview was low risk without significant ischemic territories.  EF was 60 to 65%.  No evidence of ILD by high-resolution CT.  Continue current doses of Avapro for HTN.  Had a recent presentation to [facility] ED 10/18/2020 for dyspnea on exertion for several months and had seen cardiology for similar complaints.  He reported epigastric pain and sternal pain radiating to left chest and left arm without nausea or vomiting.  He stated taking deep breaths caused him severe pain he was admitted for atypical chest pain evaluation which appeared pleuritic in nature.  He underwent PE study with no findings of PE and further work-up for ACS was unrevealing.  He underwent 2D echocardiogram with no significant wall motion abnormalities or changes to LV function.  He was seen by cardiology with recommendation to follow-up outpatient.  He is  here today.  States he has had no further episodes since discharge from hospital of chest pain.  He does have chronic shortness of breath.  He has an appointment with Dr. Elsworth Soho and already has high resolution CT scan scheduled outpatient for shortness of breath.  He states the pain woke him up about 1 AM in the morning starting in the epigastric area rating it up to mid and left chest.  Currently denies any anginal symptoms or similar symptoms since discharge.  We discussed possible repeat Lexiscan.  Previous Lexiscan stress test last year in June was low risk.  He had a previous chest CT with CAC scoring of 899 which was 63 percentile for age and sex matched control.   Past Medical History:  Diagnosis Date   Aortic stenosis    Childhood asthma    Coronary atherosclerosis    Mild at cardiac catheterization 2018   Essential hypertension    GERD (gastroesophageal reflux disease)    Hyperlipidemia     Past Surgical History:  Procedure Laterality Date   BIOPSY  07/27/2019   Procedure: BIOPSY;  Surgeon: Rogene Houston, MD;  [facility];  Service: Endoscopy;;   COLONOSCOPY  10/01/2010   Procedure: COLONOSCOPY;  Surgeon: Rogene Houston, MD;  [facility];  Service: Endoscopy;  Laterality: N/A;   COLONOSCOPY N/A 09/01/2013   Procedure: COLONOSCOPY;  Surgeon: Rogene Houston, MD;  [facility];  Service: Endoscopy;  Laterality: N/A;  940   COLONOSCOPY N/A 07/27/2019   Procedure: COLONOSCOPY;  Surgeon: Rogene Houston,  MD;  [facility];  Service: Endoscopy;  Laterality: N/A;  1030   ESOPHAGOGASTRODUODENOSCOPY     ESOPHAGOGASTRODUODENOSCOPY N/A 04/11/2014   Procedure: ESOPHAGOGASTRODUODENOSCOPY (EGD);  Surgeon: Rogene Houston, MD;  [facility];  Service: Endoscopy;  Laterality: N/A;  1200 - moved to 12:55 - Ann to notify pt   ESOPHAGOGASTRODUODENOSCOPY N/A 07/27/2019   Procedure: ESOPHAGOGASTRODUODENOSCOPY (EGD);  Surgeon: Rogene Houston, MD;   [facility];  Service: Endoscopy;  Laterality: N/A;   LEFT HEART CATH AND CORONARY ANGIOGRAPHY N/A 03/23/2016   Procedure: Left Heart Cath and Coronary Angiography;  Surgeon: Sherren Mocha, MD;  Location: [facility];  Service: Cardiovascular;  Laterality: N/A;   POLYPECTOMY  07/27/2019   Procedure: POLYPECTOMY;  Surgeon: Rogene Houston, MD;  [facility];  Service: Endoscopy;;   right leg  20 years ago   X 8 secondary to fracture   rt inguinal      hernia repair 2 months ago    Current Outpatient Medications  Medication Sig Dispense Refill   albuterol (VENTOLIN HFA) 108 (90 Base) MCG/ACT inhaler Inhale 2 puffs into the lungs every 6 (six) hours as needed for wheezing or shortness of breath. 8 g 6   betamethasone valerate lotion (VALISONE) 0.1 % Apply 1 application topically daily as needed for irritation.     carvedilol (COREG) 12.5 MG tablet Take 1 tablet (12.5 mg total) by mouth 2 (two) times daily. 180 tablet 2   clotrimazole-betamethasone (LOTRISONE) cream Apply 1 application topically 2 (two) times daily. 30 g 11   fluticasone (CUTIVATE) 0.05 % cream Apply 1 application topically daily as needed (irritation).     furosemide (LASIX) 20 MG tablet Take 20 mg by mouth daily as needed.     irbesartan (AVAPRO) 300 MG tablet TAKE 1 TABLET BY MOUTH EVERY DAY 30 tablet 6   ketoconazole (NIZORAL) 2 % cream Apply 1 application topically 2 (two) times daily as needed for irritation.     Multiple Vitamin (MULTIVITAMIN) tablet Take 1 tablet by mouth daily.     Omega-3 Fatty Acids (FISH OIL) 1200 MG CAPS Take 1 capsule by mouth 2 (two) times daily before a meal.      potassium chloride SA (KLOR-CON) 20 MEQ tablet Take 20 mEq by mouth daily.     Probiotic Product (PROBIOTIC PO) Take 1 capsule by mouth daily.      rosuvastatin (CRESTOR) 20 MG tablet Take 20 mg by mouth daily.     No current facility-administered medications for this visit.   Allergies:  Other, Sulfa  antibiotics, and Vancomycin   Social History: The patient  reports that he has never smoked. He has never used smokeless tobacco. He reports current alcohol use of about 5.0 standard drinks per week. He reports that he does not use drugs.   Family History: The patient's family history includes Cancer in his mother; Heart attack in his father.   ROS:  Please see the history of present illness. Otherwise, complete review of systems is positive for none.  All other systems are reviewed and negative.   Physical Exam: VS:  BP 136/80   Pulse 62   Ht 5\' 9"  (1.753 m)   Wt 200 lb 6.4 oz (90.9 kg)   SpO2 98%   BMI 29.59 kg/m , BMI Body mass index is 29.59 kg/m.  Wt Readings from Last 3 Encounters:  10/25/20 200 lb 6.4 oz (90.9 kg)  10/18/20 201 lb 11.5 oz (  91.5 kg)  08/07/20 203 lb 12.8 oz (92.4 kg)    General: Patient appears comfortable at rest. Neck: Supple, no elevated JVP or carotid bruits, no thyromegaly. Lungs: Clear to auscultation, nonlabored breathing at rest. Cardiac: Regular rate and rhythm, no S3 or significant systolic murmur, no pericardial rub.  Extremities: No pitting edema, distal pulses 2+. Skin: Warm and dry. Musculoskeletal: No kyphosis. Neuropsychiatric: Alert and oriented x3, affect grossly appropriate.  ECG: 10/18/2020 Sinus rhythm rate of 99, abnormal R wave progression, early transition  Recent Labwork: 10/18/2020: BUN 9; Creatinine, Ser 1.01; Hemoglobin 14.6; Platelets 215; Potassium 3.8; Sodium 139  No results found for: CHOL, TRIG, HDL, CHOLHDL, VLDL, LDLCALC, LDLDIRECT  Other Studies Reviewed Today:    CT chest 9/23/20222 IMPRESSION: 1. No evidence for acute pulmonary embolus. 2. Several pulmonary nodules are identified which appear unchanged from 10/23/2019. These are all 5 mm or less and compatible with a benign process. 3. Similar appearance subsegmental areas of scarring with volume loss in the right lung. 4. Gallstones. 5. Aortic  Atherosclerosis (ICD10-I70.0). Coronary artery calcifications.    Echocardiogram 10/18/2020 IMPRESSIONS   1. Left ventricular ejection fraction, by estimation, is 60 to 65%. The  left ventricle has normal function. The left ventricle has no regional  wall motion abnormalities.   2. Right ventricular systolic function is normal. The right ventricular  size is normal.   3. The aortic valve was not well visualized.   4. Limited echo to evaluate LV function    Cardiac catheterization 03/23/2016: 1. Mild CAD with nonobstructive left main and RCA stenosis 2. Calcified coronary arteries without significant stenoses 3. Mildly elevated LVEDP   CT coronary calcium score 06/08/2019: FINDINGS: Non-cardiac: See separate report from [facility].   Ascending Aorta: Normal size, mild ascending and descending aortic wall calcification.   Pericardium: Normal   Coronary arteries: Normal origin and left and right coronary arteries. Calcification involving all 3 coronary arteries   IMPRESSION: 1. Coronary calcium score of 899. This was 70th percentile for age and sex matched control.   2. High calcium scores. Recommend guideline directed medical therapy and risk factor modification.   Lexiscan Myoview 07/07/2019: The study is normal. This is a low risk study. The left ventricular ejection fraction is normal (55-65%). There was no ST segment deviation noted during stress.   Diaphragmatic attenuation no ischemia Low risk study EF 59%   High-resolution chest CT 10/23/2019: IMPRESSION: 1. No definitive evidence of interstitial lung disease. Air trapping is indicative of small airways disease. 2. Right middle lobe pulmonary nodules, stable from 06/08/2019 but not well seen on 02/13/2009. No follow-up needed if patient is low-risk (and has no known or suspected primary neoplasm). Non-contrast chest CT can be considered in 12 months if patient is high-risk. This recommendation follows  the consensus statement: Guidelines for Management of Incidental Pulmonary Nodules Detected on CT Images: From the Fleischner Society 2017; Radiology 2017; 284:228-243. 3. Cholelithiasis. 4. Aortic atherosclerosis (ICD10-I70.0). Coronary artery calcification.   Echocardiogram 05/23/2020:  1. Left ventricular ejection fraction, by estimation, is 60 to 65%. The  left ventricle has normal function. The left ventricle has no regional  wall motion abnormalities. Left ventricular diastolic parameters are  consistent with Grade I diastolic  dysfunction (impaired relaxation). The average left ventricular global  longitudinal strain is 18.0 %. The global longitudinal strain is normal.   2. Right ventricular systolic function is normal. The right ventricular  size is normal.   3. The mitral valve is normal in  structure. Trivial mitral valve  regurgitation. No evidence of mitral stenosis.   4. The aortic valve has an indeterminant number of cusps. There is severe  calcifcation of the aortic valve. There is severe thickening of the aortic  valve. Aortic valve regurgitation is trivial. Mild to moderate aortic  valve stenosis. Aortic valve mean  gradient measures 14.4 mmHg. Aortic valve peak gradient measures 30.5  mmHg. Aortic valve area, by VTI measures 1.26 cm.   Assessment and Plan:  1. Aortic valve stenosis, etiology of cardiac valve disease unspecified   2. Coronary artery disease involving native coronary artery of native heart with angina pectoris ([place])   3. DOE (dyspnea on exertion)   4. Essential hypertension    1. Aortic valve stenosis, etiology of cardiac valve disease unspecified Echocardiogram 05/23/2020 EF 60 to 65% no WMA's, G1 DD, trivial MR, severe calcification of aortic valve, severe thickening of aortic valve, trivial aortic valve regurgitation, mild to moderate aortic valve stenosis.  2. Coronary artery disease involving native coronary artery of native heart with angina  pectoris (HCC) High resolution CT scan chest in 2021 Aortic atherosclerosis Coronary artery calcification. Cardiac catheterization February 2018 mild CAD with nonobstructive left main and RCA stenosis, calcified coronary arteries without significant stenosis, mildly elevated LVEDP.  CT calcium score May 2021 899, 70th percentile for age and sex matched controls.  High calcium score recommended guideline directed medical therapy and risk factor modification.  Continue carvedilol 12.5 mg p.o. twice daily.  Continue Crestor 20 mg daily.  No further chest pain since hospital discharge   3. DOE (dyspnea on exertion) History of chronic DOE with recent lung nodules on CT scan.  He has an upcoming visit with Dr. Elsworth Soho and has a high resolution CT of the chest scheduled.  Follow-up with Dr. Elsworth Soho.  Patient states Dr. Elsworth Soho gave him an albuterol inhaler which does not seem to help very much with his breathing.  4. Essential hypertension Blood pressure today 136/80.  Continue carvedilol 12.5 mg p.o. twice daily.  Continue Avapro 300 mg daily.  Continue Lasix 20 mg daily.  Continue potassium supplementation daily.  5. Chest pain, unspecified type Recent episode of chest pain and was ruled out for ACS and PE.  Please get a repeat Lexiscan stress test to rule out ischemic etiology.  Medication Adjustments/Labs and Tests Ordered: Current medicines are reviewed at length with the patient today.  Concerns regarding medicines are outlined above.   Disposition: Follow-up with Dr. Domenic Polite or APP 2 to 3 months  Signed, Levell July, NP 10/25/2020 3:55 PM    [facility] at [place], [place],  [address] Phone: [phone]; Fax: [phone]

## 2020-10-25 ENCOUNTER — Other Ambulatory Visit: Payer: Self-pay

## 2020-10-25 ENCOUNTER — Ambulatory Visit: Payer: Medicare Other | Admitting: Family Medicine

## 2020-10-25 ENCOUNTER — Telehealth: Payer: Self-pay | Admitting: Family Medicine

## 2020-10-25 ENCOUNTER — Encounter: Payer: Self-pay | Admitting: Family Medicine

## 2020-10-25 VITALS — BP 136/80 | HR 62 | Ht 69.0 in | Wt 200.4 lb

## 2020-10-25 DIAGNOSIS — R0600 Dyspnea, unspecified: Secondary | ICD-10-CM | POA: Diagnosis not present

## 2020-10-25 DIAGNOSIS — R079 Chest pain, unspecified: Secondary | ICD-10-CM | POA: Diagnosis not present

## 2020-10-25 DIAGNOSIS — E1165 Type 2 diabetes mellitus with hyperglycemia: Secondary | ICD-10-CM | POA: Diagnosis not present

## 2020-10-25 DIAGNOSIS — I10 Essential (primary) hypertension: Secondary | ICD-10-CM | POA: Diagnosis not present

## 2020-10-25 DIAGNOSIS — I350 Nonrheumatic aortic (valve) stenosis: Secondary | ICD-10-CM

## 2020-10-25 DIAGNOSIS — I25119 Atherosclerotic heart disease of native coronary artery with unspecified angina pectoris: Secondary | ICD-10-CM

## 2020-10-25 DIAGNOSIS — R0609 Other forms of dyspnea: Secondary | ICD-10-CM

## 2020-10-25 DIAGNOSIS — E7800 Pure hypercholesterolemia, unspecified: Secondary | ICD-10-CM | POA: Diagnosis not present

## 2020-10-25 NOTE — Patient Instructions (Addendum)
Medication Instructions:  Your physician recommends that you continue on your current medications as directed. Please refer to the Current Medication list given to you today.  *If you need a refill on your cardiac medications before your next appointment, please call your pharmacy*   Lab Work: None If you have labs (blood work) drawn today and your tests are completely normal, you will receive your results only by: [place] (if you have MyChart) OR A paper copy in the mail If you have any lab test that is abnormal or we need to change your treatment, we will call you to review the results.   Testing/Procedures: Your physician has requested that you have a lexiscan myoview. For further information please visit [email or web address]. Please follow instruction sheet, as given.    Follow-Up: At [facility], you and your health needs are our priority.  As part of our continuing mission to provide you with exceptional heart care, we have created designated Provider Care Teams.  These Care Teams include your primary Cardiologist (physician) and Advanced Practice Providers (APPs -  Physician Assistants and Nurse Practitioners) who all work together to provide you with the care you need, when you need it.  We recommend signing up for the patient portal called "MyChart".  Sign up information is provided on this After Visit Summary.  MyChart is used to connect with patients for Virtual Visits (Telemedicine).  Patients are able to view lab/test results, encounter notes, upcoming appointments, etc.  Non-urgent messages can be sent to your provider as well.   To learn more about what you can do with MyChart, go to [email or web address].    Your next appointment:   2-3 month(s)  The format for your next appointment:   In Person  Provider:   Katina Dung, NP   Other Instructions

## 2020-10-25 NOTE — Telephone Encounter (Signed)
Checking percert on the following patient for testing scheduled at [facility].     LEXISCAN   10/29/2020

## 2020-10-29 ENCOUNTER — Encounter (HOSPITAL_COMMUNITY)
Admission: RE | Admit: 2020-10-29 | Discharge: 2020-10-29 | Disposition: A | Discharge status: Home / Self Care | Payer: Medicare Other | Source: Ambulatory Visit | Attending: Family Medicine | Admitting: Family Medicine

## 2020-10-29 ENCOUNTER — Other Ambulatory Visit: Payer: Self-pay

## 2020-10-29 ENCOUNTER — Ambulatory Visit (HOSPITAL_COMMUNITY)
Admission: RE | Admit: 2020-10-29 | Discharge: 2020-10-29 | Disposition: A | Discharge status: Home / Self Care | Payer: Medicare Other | Source: Ambulatory Visit | Attending: Family Medicine | Admitting: Family Medicine

## 2020-10-29 DIAGNOSIS — R079 Chest pain, unspecified: Secondary | ICD-10-CM

## 2020-10-29 LAB — NM MYOCAR MULTI W/SPECT W/WALL MOTION / EF
LV dias vol: 91 mL (ref 62–150)
LV sys vol: 43 mL
Nuc Stress EF: 53 %
Peak HR: 93 {beats}/min
RATE: 0.3
Rest HR: 52 {beats}/min
Rest Nuclear Isotope Dose: 11 mCi
SDS: 0
SRS: 1
SSS: 1
ST Depression (mm): 0 mm
Stress Nuclear Isotope Dose: 31 mCi
TID: 0.96

## 2020-10-29 MED ORDER — SODIUM CHLORIDE FLUSH 0.9 % IV SOLN
INTRAVENOUS | Status: AC
  Administered 2020-10-29: 10 mL via INTRAVENOUS
  Filled 2020-10-29: qty 10

## 2020-10-29 MED ORDER — TECHNETIUM TC 99M TETROFOSMIN IV KIT
10.0000 | PACK | Freq: Once | INTRAVENOUS | Status: AC | PRN
  Administered 2020-10-29: 11 via INTRAVENOUS

## 2020-10-29 MED ORDER — REGADENOSON 0.4 MG/5ML IV SOLN
INTRAVENOUS | Status: AC
  Administered 2020-10-29: 0.4 mg via INTRAVENOUS
  Filled 2020-10-29: qty 5

## 2020-10-29 MED ORDER — TECHNETIUM TC 99M TETROFOSMIN IV KIT
30.0000 | PACK | Freq: Once | INTRAVENOUS | Status: AC | PRN
  Administered 2020-10-29: 31 via INTRAVENOUS

## 2020-11-01 ENCOUNTER — Telehealth: Payer: Self-pay | Admitting: Cardiology

## 2020-11-01 NOTE — Telephone Encounter (Signed)
Calling for stress test results  

## 2020-11-04 NOTE — Telephone Encounter (Signed)
Pt notified, PCP copied.

## 2020-11-04 NOTE — Telephone Encounter (Signed)
Please let the patient know the stress test was determined to be low risk by the interpreting physician . He did have frequent PVC's during the test.   Verta Ellen, NP  10/30/2020 7:56 PM    Left a message for patient to give office a call back for results.

## 2020-11-05 DIAGNOSIS — Z299 Encounter for prophylactic measures, unspecified: Secondary | ICD-10-CM | POA: Diagnosis not present

## 2020-11-05 DIAGNOSIS — I700 Atherosclerosis of aorta: Secondary | ICD-10-CM | POA: Diagnosis not present

## 2020-11-05 DIAGNOSIS — J849 Interstitial pulmonary disease, unspecified: Secondary | ICD-10-CM | POA: Diagnosis not present

## 2020-11-05 DIAGNOSIS — I10 Essential (primary) hypertension: Secondary | ICD-10-CM | POA: Diagnosis not present

## 2020-11-18 ENCOUNTER — Other Ambulatory Visit: Payer: Self-pay

## 2020-11-18 ENCOUNTER — Ambulatory Visit (HOSPITAL_COMMUNITY)
Admission: RE | Admit: 2020-11-18 | Discharge: 2020-11-18 | Disposition: A | Discharge status: Home / Self Care | Payer: Medicare Other | Source: Ambulatory Visit | Attending: Pulmonary Disease | Admitting: Pulmonary Disease

## 2020-11-18 DIAGNOSIS — R911 Solitary pulmonary nodule: Secondary | ICD-10-CM | POA: Diagnosis not present

## 2020-11-18 DIAGNOSIS — J849 Interstitial pulmonary disease, unspecified: Secondary | ICD-10-CM | POA: Insufficient documentation

## 2020-11-18 DIAGNOSIS — I700 Atherosclerosis of aorta: Secondary | ICD-10-CM | POA: Diagnosis not present

## 2020-11-18 DIAGNOSIS — R0602 Shortness of breath: Secondary | ICD-10-CM | POA: Diagnosis not present

## 2020-11-18 DIAGNOSIS — R918 Other nonspecific abnormal finding of lung field: Secondary | ICD-10-CM | POA: Diagnosis not present

## 2020-11-25 DIAGNOSIS — I10 Essential (primary) hypertension: Secondary | ICD-10-CM | POA: Diagnosis not present

## 2020-11-25 DIAGNOSIS — E7800 Pure hypercholesterolemia, unspecified: Secondary | ICD-10-CM | POA: Diagnosis not present

## 2020-11-25 DIAGNOSIS — M159 Polyosteoarthritis, unspecified: Secondary | ICD-10-CM | POA: Diagnosis not present

## 2020-11-25 DIAGNOSIS — E1165 Type 2 diabetes mellitus with hyperglycemia: Secondary | ICD-10-CM | POA: Diagnosis not present

## 2020-12-02 ENCOUNTER — Ambulatory Visit: Payer: Medicare Other | Admitting: Pulmonary Disease

## 2020-12-02 ENCOUNTER — Other Ambulatory Visit: Payer: Self-pay

## 2020-12-02 ENCOUNTER — Encounter: Payer: Self-pay | Admitting: Pulmonary Disease

## 2020-12-02 DIAGNOSIS — J8410 Pulmonary fibrosis, unspecified: Secondary | ICD-10-CM | POA: Diagnosis not present

## 2020-12-02 DIAGNOSIS — R0609 Other forms of dyspnea: Secondary | ICD-10-CM | POA: Diagnosis not present

## 2020-12-02 NOTE — Patient Instructions (Addendum)
You have scarring in your right lung Your right diaphragm appears weaker You have moderate aortic valve narrowing  CT scan appears stable, which is reassuring Rehab program recommended

## 2020-12-02 NOTE — Assessment & Plan Note (Signed)
This does not appear to be IPF or other ILD.  This appears to be simply postinfectious/inflammatory scarring and is nonprogressive on the last year. He does seem to have mild elevation of his right hemidiaphragm but I do not feel that this should be causing him significant dyspnea and so we will not pursue further work-up

## 2020-12-02 NOTE — Progress Notes (Signed)
   Subjective:    Patient ID: Shane Osborne, male    DOB: 23-Feb-1940, 80 y.o.   MRN: [id number]  HPI 80 yo never smoker, retired Administrator for [employer] of dyspnea on exertion PMH - mod AS Referred by cardiology because cardiac CT suggested ILD, initial impression was postinfectious scarring  Chief Complaint  Patient presents with   Follow-up    Sore throat starting a few days ago. SOB is same since last OV a year ago HRCT done 11/21/20    On initial impression on his last office visit 1 year ago was post infectious/inflammatory scarring.  He reports persistent dyspnea on usual activities especially while vacuuming or climbing stairs, NYHA class III. He underwent cardiology evaluation which confirmed moderate aortic stenosis on his last echo 04/2020 with a peak gradient of 31.  He had an ED visit 10/18/2020 for chest pain, cardiac evaluation was negative for acute ischemia. He underwent nuclear perfusion study 10/2018 which showed normal LVEF and was a low risk study  We reviewed follow-up HRCT today  He has lost 14 pounds in the last year    Significant tests/ events reviewed PFTs 10/2019 >> no airway obstruction, ratio 85, mild restriction with TLC 75%, 11% bronchodilator response, normal diffusion capacity   HRCT 10/2020  , bandlike scarring and volume loss of RUL &  right lung base, with associated elevation of the right hemidiaphragm, tiny 76mm nodules stable  CT angio chest 09/2020 >> neg PE  HRCT 09/2019 >> no ILD, air trapping, RML nodules 51mm stable from 05/2019 -Mild bronchiectasis at bases to my review   Echo 04/2019 normal LVEF, moderate aortic stenosis, peak gradient 29, elevated RVSP, mild MR   Cardiac CT 05/2019 >> Patchy areas of ground-glass attenuation and mild septal thickening noted in the visualized lungs Aortic atherosclerosis. Elevation of the right hemidiaphragm   Elevated L HD since 02/13/09 CTa chest  -Cath 03/23/16 and LVEDP 20      - Spirometry 05/19/2016   FEV1 2.05 (89%)  Ratio 76 with classic upper airway truncation pattern  Review of Systems neg for any significant sore throat, dysphagia, itching, sneezing, nasal congestion or excess/ purulent secretions, fever, chills, sweats, unintended wt loss, pleuritic or exertional cp, hempoptysis, orthopnea pnd or change in chronic leg swelling. Also denies presyncope, palpitations, heartburn, abdominal pain, nausea, vomiting, diarrhea or change in bowel or urinary habits, dysuria,hematuria, rash, arthralgias, visual complaints, headache, numbness weakness or ataxia.     Objective:   Physical Exam  Gen. Pleasant, obese, in no distress ENT - no lesions, no post nasal drip Neck: No JVD, no thyromegaly, no carotid bruits Lungs: no use of accessory muscles, no dullness to percussion, decreased without rales or rhonchi  Cardiovascular: Rhythm regular, heart sounds  normal, no murmurs or gallops, no peripheral edema Musculoskeletal: No deformities, no cyanosis or clubbing , no tremors        Assessment & Plan:

## 2020-12-02 NOTE — Assessment & Plan Note (Signed)
His dyspnea appears to be multifactorial -he has mild restriction related to postinflammatory scarring in the right lung.  He has elevated right hemidiaphragm and he also has moderate aortic stenosis. He has done well by losing 14 pounds over the past year but his dyspnea exists and is NYHA class III although nonprogressive over the past year.  Of the above issues only the aortic stenosis is fixable but peak gradient does not suggest that this is severe.  I explained to him that it may be best to take a wait and watch approach.  I offered him cardiac rehab to improve his conditioning

## 2020-12-17 NOTE — Progress Notes (Signed)
Cardiology Office Note  Date: 12/18/2020   ID: [name], [name] 1940-10-24, MRN [id number]  PCP:  Glenda Chroman, MD  Cardiologist:  Rozann Lesches, MD Electrophysiologist:  None   Chief Complaint: 72-month follow-up/hospital follow-up  History of Present Illness: Shane Osborne is a 80 y.o. male with a history of HTN, atrial fibrillation, ILD, HLD, CAD, GERD.   He was last seen by Dr. Domenic Polite on 05/31/2020.  He reported dyspnea on exertion.  No exertional chest pain or palpitations.  He remained active with ADLs and walked his dog regularly.  Recent echocardiogram EF 60 to 65% with mild diastolic dysfunction, normal RV contraction, overall moderate calcific aortic stenosis.  Had previous reassuring ischemic testing in June 2021.  His medications were reviewed.  There was discussion of reducing Coreg to 12.5 mg p.o. twice daily in case there was a component of symptomatic bradycardia or chronotropic incompetence.   Plan was to continue surveillance of aortic stenosis and repeat echocardiogram the following year.  He was to continue Crestor for mild CAD.  Follow-up Myoview was low risk without significant ischemic territories.  EF was 60 to 65%.  No evidence of ILD by high-resolution CT.  Continue current doses of Avapro for HTN.  Had a recent presentation to [facility] ED 10/18/2020 for dyspnea on exertion for several months and had seen cardiology for similar complaints.  He reported epigastric pain and sternal pain radiating to left chest and left arm without nausea or vomiting.  He stated taking deep breaths caused him severe pain he was admitted for atypical chest pain evaluation which appeared pleuritic in nature.  He underwent PE study with no findings of PE and further work-up for ACS was unrevealing.  He underwent 2D echocardiogram with no significant wall motion abnormalities or changes to LV function.  He was seen by cardiology with recommendation to follow-up outpatient.  At last  visit he stated had no further episodes since discharge from hospital of chest pain.  Has chronic shortness of breath.  Had an appointment with Dr. Elsworth Soho and already had he is here for follow-up status post recent stress test which was considered low risk.  Had high resolution CT scan scheduled outpatient for shortness of breath.  He stated the pain woke him up about 1 AM in the morning starting in the epigastric area rating it up to mid and left chest.  Currently denies any anginal symptoms or similar symptoms since discharge.  We discussed possible repeat Lexiscan.  Previous Lexiscan stress test last year in June was low risk.  He had a previous chest CT with CAC scoring of 899 which was 25 percentile for age and sex matched control.   He is here for follow-up status post recent Lexiscan stress test which was considered low risk.  He denies any further chest pain.  He does have some DOE when performing more than usual ADLs.  Recently had a high-resolution CT with Dr. Elsworth Soho which showed some mild lobular air trapping on expiratory phase imaging suggestive of small airway disease.  Evidence of coronary artery disease, aortic valve calcifications.  Cholelithiasis, innominate obstructive right nephrolithiasis.,  Aortic atherosclerosis.  The only complaint he is really having today is complaints of fluctuating blood pressures.  We discussed his aortic stenosis on echocardiogram in April of this year with evidence of mild to moderate stenosis.  He denies any current anginal symptoms.  No exertional symptoms with normal ADLs.  Denies any orthostatic symptoms, CVA or  TIA-like symptoms, PND, orthopnea, bleeding, claudication-like symptoms, DVT or PE-like symptoms, or lower extremity edema.  BP today is 126/82 heart rate of 58.   Past Medical History:  Diagnosis Date   Aortic stenosis    Childhood asthma    Coronary atherosclerosis    Mild at cardiac catheterization 2018   Essential hypertension    GERD  (gastroesophageal reflux disease)    Hyperlipidemia     Past Surgical History:  Procedure Laterality Date   BIOPSY  07/27/2019   Procedure: BIOPSY;  Surgeon: Rogene Houston, MD;  [facility]: [facility];  Service: Endoscopy;;   COLONOSCOPY  10/01/2010   Procedure: COLONOSCOPY;  Surgeon: Rogene Houston, MD;  Location: [facility];  Service: Endoscopy;  Laterality: N/A;   COLONOSCOPY N/A 09/01/2013   Procedure: COLONOSCOPY;  Surgeon: Rogene Houston, MD;  [facility]: [facility];  Service: Endoscopy;  Laterality: N/A;  940   COLONOSCOPY N/A 07/27/2019   Procedure: COLONOSCOPY;  Surgeon: Rogene Houston, MD;  [facility]: [facility];  Service: Endoscopy;  Laterality: N/A;  1030   ESOPHAGOGASTRODUODENOSCOPY     ESOPHAGOGASTRODUODENOSCOPY N/A 04/11/2014   Procedure: ESOPHAGOGASTRODUODENOSCOPY (EGD);  Surgeon: Rogene Houston, MD;  [facility]: [facility];  Service: Endoscopy;  Laterality: N/A;  1200 - moved to 12:55 - Ann to notify pt   ESOPHAGOGASTRODUODENOSCOPY N/A 07/27/2019   Procedure: ESOPHAGOGASTRODUODENOSCOPY (EGD);  Surgeon: Rogene Houston, MD;  [facility]: [facility];  Service: Endoscopy;  Laterality: N/A;   LEFT HEART CATH AND CORONARY ANGIOGRAPHY N/A 03/23/2016   Procedure: Left Heart Cath and Coronary Angiography;  Surgeon: Sherren Mocha, MD;  Location: [facility];  Service: Cardiovascular;  Laterality: N/A;   POLYPECTOMY  07/27/2019   Procedure: POLYPECTOMY;  Surgeon: Rogene Houston, MD;  [facility]: [facility];  Service: Endoscopy;;   right leg  20 years ago   X 8 secondary to fracture   rt inguinal      hernia repair 2 months ago    Current Outpatient Medications  Medication Sig Dispense Refill   albuterol (VENTOLIN HFA) 108 (90 Base) MCG/ACT inhaler Inhale 2 puffs into the lungs every 6 (six) hours as needed for wheezing or shortness of breath. 8 g 6   betamethasone valerate lotion (VALISONE) 0.1 % Apply 1 application topically daily as needed for irritation.      carvedilol (COREG) 12.5 MG tablet Take 1 tablet (12.5 mg total) by mouth 2 (two) times daily. 180 tablet 2   clotrimazole-betamethasone (LOTRISONE) cream Apply 1 application topically 2 (two) times daily. 30 g 11   fluticasone (CUTIVATE) 0.05 % cream Apply 1 application topically daily as needed (irritation).     furosemide (LASIX) 20 MG tablet Take 20 mg by mouth daily as needed.     irbesartan (AVAPRO) 300 MG tablet TAKE 1 TABLET BY MOUTH EVERY DAY 30 tablet 6   ketoconazole (NIZORAL) 2 % cream Apply 1 application topically 2 (two) times daily as needed for irritation.     Multiple Vitamin (MULTIVITAMIN) tablet Take 1 tablet by mouth daily.     Omega-3 Fatty Acids (FISH OIL) 1200 MG CAPS Take 1 capsule by mouth 2 (two) times daily before a meal.      potassium chloride SA (KLOR-CON) 20 MEQ tablet Take 20 mEq by mouth daily.     Probiotic Product (PROBIOTIC PO) Take 1 capsule by mouth daily.      rosuvastatin (CRESTOR) 20 MG tablet Take 20 mg by mouth daily.  No current facility-administered medications for this visit.   Allergies:  Other, Sulfa antibiotics, and Vancomycin   Social History: The patient  reports that he has never smoked. He has never used smokeless tobacco. He reports current alcohol use of about 5.0 standard drinks per week. He reports that he does not use drugs.   Family History: The patient's family history includes Cancer in his mother; Heart attack in his father.   ROS:  Please see the history of present illness. Otherwise, complete review of systems is positive for none.  All other systems are reviewed and negative.   Physical Exam: VS:  BP 126/82   Pulse (!) 58   Ht 5\' 9"  (1.753 m)   Wt 199 lb (90.3 kg)   SpO2 97%   BMI 29.39 kg/m , BMI Body mass index is 29.39 kg/m.  Wt Readings from Last 3 Encounters:  12/18/20 199 lb (90.3 kg)  12/02/20 195 lb 1.9 oz (88.5 kg)  10/25/20 200 lb 6.4 oz (90.9 kg)    General: Patient appears comfortable at rest. Neck:  Supple, no elevated JVP or carotid bruits, no thyromegaly. Lungs: Clear to auscultation, nonlabored breathing at rest. Cardiac: Regular rate and rhythm, no S3 or significant systolic murmur, no pericardial rub. Extremities: No pitting edema, distal pulses 2+. Skin: Warm and dry. Musculoskeletal: No kyphosis. Neuropsychiatric: Alert and oriented x3, affect grossly appropriate.  ECG: 10/18/2020 Sinus rhythm rate of 99, abnormal R wave progression, early transition  Recent Labwork: 10/18/2020: BUN 9; Creatinine, Ser 1.01; Hemoglobin 14.6; Platelets 215; Potassium 3.8; Sodium 139  No results found for: CHOL, TRIG, HDL, CHOLHDL, VLDL, LDLCALC, LDLDIRECT  Other Studies Reviewed Today:    High resolution CT scan ordered by pulmonary 11/20/2020 IMPRESSION: 1. Evaluation for pulmonary fibrosis is limited by breath motion artifact, particularly at the lung bases. Within this limitation, there is generally bland appearing, bandlike scarring and volume loss involving the right lung base, with associated elevation of the right hemidiaphragm, as well as additional bandlike scarring of the anterior right upper lobe. Findings are unchanged compared to prior examination and consistent with bland sequelae of prior infection or inflammation. No evidence of fibrotic interstitial lung disease. 2. Occasional small bilateral pulmonary nodules measuring 5 mm and smaller are stable and benign. No further routine CT follow-up is required for these nodules. 3. Mild, lobular air trapping on expiratory phase imaging, suggestive of small airways disease. 4. Coronary artery disease. 5. Aortic valve calcifications. Correlate for echocardiographic evidence of aortic valve dysfunction. 6. Cholelithiasis. 7. Nonobstructive right nephrolithiasis.   Aortic Atherosclerosis (ICD10-I70.0).    Nuclear stress test 10/29/2020  Narrative & Impression      Findings are consistent with no ischemia. The study is low  risk.   No ST deviation was noted. Arrhythmias during stress: frequent PVCs. The ECG was negative for ischemia.   LV perfusion is normal in the setting of diaphragmatic attenuation.  There is an inferior/inferoseptal perfusion defect that is more prominent on rest imaging consistent with soft tissue attenuation, no definite ischemia.   Left ventricular function is normal. Nuclear stress EF: 53 %.   Diaphragmatic attenuation artifact was present.       CT chest 9/23/20222 IMPRESSION: 1. No evidence for acute pulmonary embolus. 2. Several pulmonary nodules are identified which appear unchanged from 10/23/2019. These are all 5 mm or less and compatible with a benign process. 3. Similar appearance subsegmental areas of scarring with volume loss in the right lung. 4. Gallstones. 5. Aortic Atherosclerosis (  ICD10-I70.0). Coronary artery calcifications.    Echocardiogram 10/18/2020 IMPRESSIONS   1. Left ventricular ejection fraction, by estimation, is 60 to 65%. The  left ventricle has normal function. The left ventricle has no regional  wall motion abnormalities.   2. Right ventricular systolic function is normal. The right ventricular  size is normal.   3. The aortic valve was not well visualized.   4. Limited echo to evaluate LV function    Cardiac catheterization 03/23/2016: 1. Mild CAD with nonobstructive left main and RCA stenosis 2. Calcified coronary arteries without significant stenoses 3. Mildly elevated LVEDP   CT coronary calcium score 06/08/2019: FINDINGS: Non-cardiac: See separate report from [facility].   Ascending Aorta: Normal size, mild ascending and descending aortic wall calcification.   Pericardium: Normal   Coronary arteries: Normal origin and left and right coronary arteries. Calcification involving all 3 coronary arteries   IMPRESSION: 1. Coronary calcium score of 899. This was 70th percentile for age and sex matched control.   2. High  calcium scores. Recommend guideline directed medical therapy and risk factor modification.   Lexiscan Myoview 07/07/2019: The study is normal. This is a low risk study. The left ventricular ejection fraction is normal (55-65%). There was no ST segment deviation noted during stress.   Diaphragmatic attenuation no ischemia Low risk study EF 59%   High-resolution chest CT 10/23/2019: IMPRESSION: 1. No definitive evidence of interstitial lung disease. Air trapping is indicative of small airways disease. 2. Right middle lobe pulmonary nodules, stable from 06/08/2019 but not well seen on 02/13/2009. No follow-up needed if patient is low-risk (and has no known or suspected primary neoplasm). Non-contrast chest CT can be considered in 12 months if patient is high-risk. This recommendation follows the consensus statement: Guidelines for Management of Incidental Pulmonary Nodules Detected on CT Images: From the Fleischner Society 2017; Radiology 2017; 284:228-243. 3. Cholelithiasis. 4. Aortic atherosclerosis (ICD10-I70.0). Coronary artery calcification.   Echocardiogram 05/23/2020:  1. Left ventricular ejection fraction, by estimation, is 60 to 65%. The  left ventricle has normal function. The left ventricle has no regional  wall motion abnormalities. Left ventricular diastolic parameters are  consistent with Grade I diastolic  dysfunction (impaired relaxation). The average left ventricular global  longitudinal strain is 18.0 %. The global longitudinal strain is normal.   2. Right ventricular systolic function is normal. The right ventricular  size is normal.   3. The mitral valve is normal in structure. Trivial mitral valve  regurgitation. No evidence of mitral stenosis.   4. The aortic valve has an indeterminant number of cusps. There is severe  calcifcation of the aortic valve. There is severe thickening of the aortic  valve. Aortic valve regurgitation is trivial. Mild to moderate  aortic  valve stenosis. Aortic valve mean  gradient measures 14.4 mmHg. Aortic valve peak gradient measures 30.5  mmHg. Aortic valve area, by VTI measures 1.26 cm.   Assessment and Plan:  1. Aortic valve stenosis, etiology of cardiac valve disease unspecified   2. Coronary artery disease involving native coronary artery of native heart with angina pectoris ([place])   3. DOE (dyspnea on exertion)   4. Essential hypertension   5. Chest pain, unspecified type     1. Aortic valve stenosis, etiology of cardiac valve disease unspecified Echocardiogram 05/23/2020 EF 60 to 65% no WMA's, G1 DD, trivial MR, severe calcification of aortic valve, severe thickening of aortic valve, trivial aortic valve regurgitation, mild to moderate aortic valve stenosis.  We discussed progression  of aortic stenosis and parameters.  I gave him 2 handouts regarding aortic stenosis parameters and a copy of his echocardiogram so he could discuss this with his grandson who is a vascular surgeon regarding natural progression of aortic stenosis.  He verbalizes understanding.  Denies any issues other than some increased shortness of breath when performing more exertional activity.  2. Coronary artery disease involving native coronary artery of native heart with angina pectoris (H[facility]) High resolution CT scan chest in 2021 Aortic atherosclerosis Coronary artery calcification. Cardiac catheterization February 2018 mild CAD with nonobstructive left main and RCA stenosis, calcified coronary arteries without significant stenosis, mildly elevated LVEDP.  CT calcium score May 2021 899, 70th percentile for age and sex matched controls.  High calcium score recommended guideline directed medical therapy and risk factor modification.  Continue carvedilol 12.5 mg p.o. twice daily.  Continue Crestor 20 mg daily.  No further chest pain since hospital discharge   3. DOE (dyspnea on exertion) History of chronic DOE with recent lung nodules on CT  scan.  He recently saw Dr. Gearlean Alf had a high-resolution CT scan with results above.  No significant lung issues per Dr. Elsworth Soho.  Patient states Dr. Elsworth Soho gave him an albuterol inhaler which does not seem to help very much with his breathing.  4. Essential hypertension Blood pressure today 126/82 continue carvedilol 12.5 mg p.o. twice daily.  Continue Avapro 300 mg daily.  Continue Lasix 20 mg daily.  Continue potassium supplementation daily.  5. Chest pain, unspecified type  Recent low risk stress test.  See report above.  States he has had no more chest pain since previous episode.  Continue carvedilol 12.5 mg p.o. twice daily.  Continue Crestor 20 mg p.o. daily   Medication Adjustments/Labs and Tests Ordered: Current medicines are reviewed at length with the patient today.  Concerns regarding medicines are outlined above.   Disposition: Follow-up with Dr. Domenic Polite or APP 6 months  Signed, Levell July, NP 12/18/2020 10:55 AM    [facility] [place], [place], [place] [address] Phone: [phone]; Fax: [phone]

## 2020-12-18 ENCOUNTER — Encounter: Payer: Self-pay | Admitting: Family Medicine

## 2020-12-18 ENCOUNTER — Ambulatory Visit: Payer: Medicare Other | Admitting: Family Medicine

## 2020-12-18 VITALS — BP 126/82 | HR 58 | Ht 69.0 in | Wt 199.0 lb

## 2020-12-18 DIAGNOSIS — I25119 Atherosclerotic heart disease of native coronary artery with unspecified angina pectoris: Secondary | ICD-10-CM | POA: Diagnosis not present

## 2020-12-18 DIAGNOSIS — R079 Chest pain, unspecified: Secondary | ICD-10-CM | POA: Diagnosis not present

## 2020-12-18 DIAGNOSIS — R0609 Other forms of dyspnea: Secondary | ICD-10-CM

## 2020-12-18 DIAGNOSIS — I350 Nonrheumatic aortic (valve) stenosis: Secondary | ICD-10-CM | POA: Diagnosis not present

## 2020-12-18 DIAGNOSIS — I10 Essential (primary) hypertension: Secondary | ICD-10-CM

## 2020-12-18 NOTE — Patient Instructions (Addendum)

## 2020-12-25 DIAGNOSIS — I10 Essential (primary) hypertension: Secondary | ICD-10-CM | POA: Diagnosis not present

## 2021-01-24 ENCOUNTER — Ambulatory Visit: Payer: Medicare Other | Admitting: Family Medicine

## 2021-01-24 DIAGNOSIS — I10 Essential (primary) hypertension: Secondary | ICD-10-CM | POA: Diagnosis not present

## 2021-02-23 DIAGNOSIS — I10 Essential (primary) hypertension: Secondary | ICD-10-CM | POA: Diagnosis not present

## 2021-03-07 DIAGNOSIS — D692 Other nonthrombocytopenic purpura: Secondary | ICD-10-CM | POA: Diagnosis not present

## 2021-03-07 DIAGNOSIS — I700 Atherosclerosis of aorta: Secondary | ICD-10-CM | POA: Diagnosis not present

## 2021-03-07 DIAGNOSIS — R29898 Other symptoms and signs involving the musculoskeletal system: Secondary | ICD-10-CM | POA: Diagnosis not present

## 2021-03-07 DIAGNOSIS — Z299 Encounter for prophylactic measures, unspecified: Secondary | ICD-10-CM | POA: Diagnosis not present

## 2021-03-07 DIAGNOSIS — I10 Essential (primary) hypertension: Secondary | ICD-10-CM | POA: Diagnosis not present

## 2021-03-10 DIAGNOSIS — M47812 Spondylosis without myelopathy or radiculopathy, cervical region: Secondary | ICD-10-CM | POA: Diagnosis not present

## 2021-03-10 DIAGNOSIS — I6782 Cerebral ischemia: Secondary | ICD-10-CM | POA: Diagnosis not present

## 2021-03-10 DIAGNOSIS — I6381 Other cerebral infarction due to occlusion or stenosis of small artery: Secondary | ICD-10-CM | POA: Diagnosis not present

## 2021-03-10 DIAGNOSIS — G459 Transient cerebral ischemic attack, unspecified: Secondary | ICD-10-CM | POA: Diagnosis not present

## 2021-03-10 DIAGNOSIS — G319 Degenerative disease of nervous system, unspecified: Secondary | ICD-10-CM | POA: Diagnosis not present

## 2021-03-12 DIAGNOSIS — Z299 Encounter for prophylactic measures, unspecified: Secondary | ICD-10-CM | POA: Diagnosis not present

## 2021-03-12 DIAGNOSIS — I10 Essential (primary) hypertension: Secondary | ICD-10-CM | POA: Diagnosis not present

## 2021-03-12 DIAGNOSIS — I6381 Other cerebral infarction due to occlusion or stenosis of small artery: Secondary | ICD-10-CM | POA: Diagnosis not present

## 2021-03-12 DIAGNOSIS — G319 Degenerative disease of nervous system, unspecified: Secondary | ICD-10-CM | POA: Diagnosis not present

## 2021-03-12 DIAGNOSIS — R29898 Other symptoms and signs involving the musculoskeletal system: Secondary | ICD-10-CM | POA: Diagnosis not present

## 2021-03-17 DIAGNOSIS — R295 Transient paralysis: Secondary | ICD-10-CM | POA: Diagnosis not present

## 2021-03-17 DIAGNOSIS — G459 Transient cerebral ischemic attack, unspecified: Secondary | ICD-10-CM | POA: Diagnosis not present

## 2021-03-19 DIAGNOSIS — Z789 Other specified health status: Secondary | ICD-10-CM | POA: Diagnosis not present

## 2021-03-19 DIAGNOSIS — I10 Essential (primary) hypertension: Secondary | ICD-10-CM | POA: Diagnosis not present

## 2021-03-19 DIAGNOSIS — R002 Palpitations: Secondary | ICD-10-CM | POA: Diagnosis not present

## 2021-03-19 DIAGNOSIS — I739 Peripheral vascular disease, unspecified: Secondary | ICD-10-CM | POA: Diagnosis not present

## 2021-03-19 DIAGNOSIS — Z299 Encounter for prophylactic measures, unspecified: Secondary | ICD-10-CM | POA: Diagnosis not present

## 2021-03-19 DIAGNOSIS — R29898 Other symptoms and signs involving the musculoskeletal system: Secondary | ICD-10-CM | POA: Diagnosis not present

## 2021-03-20 DIAGNOSIS — R531 Weakness: Secondary | ICD-10-CM | POA: Diagnosis not present

## 2021-03-20 DIAGNOSIS — M4803 Spinal stenosis, cervicothoracic region: Secondary | ICD-10-CM | POA: Diagnosis not present

## 2021-03-20 DIAGNOSIS — M4319 Spondylolisthesis, multiple sites in spine: Secondary | ICD-10-CM | POA: Diagnosis not present

## 2021-03-20 DIAGNOSIS — M4802 Spinal stenosis, cervical region: Secondary | ICD-10-CM | POA: Diagnosis not present

## 2021-03-20 DIAGNOSIS — R29898 Other symptoms and signs involving the musculoskeletal system: Secondary | ICD-10-CM | POA: Diagnosis not present

## 2021-03-24 DIAGNOSIS — M4802 Spinal stenosis, cervical region: Secondary | ICD-10-CM | POA: Diagnosis not present

## 2021-03-24 DIAGNOSIS — R531 Weakness: Secondary | ICD-10-CM | POA: Diagnosis not present

## 2021-03-25 DIAGNOSIS — I10 Essential (primary) hypertension: Secondary | ICD-10-CM | POA: Diagnosis not present

## 2021-03-26 DIAGNOSIS — M4802 Spinal stenosis, cervical region: Secondary | ICD-10-CM | POA: Diagnosis not present

## 2021-03-26 DIAGNOSIS — R531 Weakness: Secondary | ICD-10-CM | POA: Diagnosis not present

## 2021-03-28 DIAGNOSIS — M4802 Spinal stenosis, cervical region: Secondary | ICD-10-CM | POA: Diagnosis not present

## 2021-03-28 DIAGNOSIS — R531 Weakness: Secondary | ICD-10-CM | POA: Diagnosis not present

## 2021-03-29 ENCOUNTER — Other Ambulatory Visit (INDEPENDENT_AMBULATORY_CARE_PROVIDER_SITE_OTHER): Payer: Self-pay | Admitting: Internal Medicine

## 2021-04-03 DIAGNOSIS — R531 Weakness: Secondary | ICD-10-CM | POA: Diagnosis not present

## 2021-04-03 DIAGNOSIS — M4802 Spinal stenosis, cervical region: Secondary | ICD-10-CM | POA: Diagnosis not present

## 2021-04-07 DIAGNOSIS — R531 Weakness: Secondary | ICD-10-CM | POA: Diagnosis not present

## 2021-04-07 DIAGNOSIS — M4802 Spinal stenosis, cervical region: Secondary | ICD-10-CM | POA: Diagnosis not present

## 2021-04-11 DIAGNOSIS — M4802 Spinal stenosis, cervical region: Secondary | ICD-10-CM | POA: Diagnosis not present

## 2021-04-11 DIAGNOSIS — R531 Weakness: Secondary | ICD-10-CM | POA: Diagnosis not present

## 2021-04-14 DIAGNOSIS — M4802 Spinal stenosis, cervical region: Secondary | ICD-10-CM | POA: Diagnosis not present

## 2021-04-14 DIAGNOSIS — R531 Weakness: Secondary | ICD-10-CM | POA: Diagnosis not present

## 2021-04-21 DIAGNOSIS — M4802 Spinal stenosis, cervical region: Secondary | ICD-10-CM | POA: Diagnosis not present

## 2021-04-21 DIAGNOSIS — R531 Weakness: Secondary | ICD-10-CM | POA: Diagnosis not present

## 2021-04-23 ENCOUNTER — Encounter: Payer: Self-pay | Admitting: Neurology

## 2021-04-23 ENCOUNTER — Ambulatory Visit: Payer: Medicare Other | Admitting: Neurology

## 2021-04-23 VITALS — BP 150/81 | HR 55 | Ht 69.0 in | Wt 195.8 lb

## 2021-04-23 DIAGNOSIS — R29898 Other symptoms and signs involving the musculoskeletal system: Secondary | ICD-10-CM

## 2021-04-23 DIAGNOSIS — G540 Brachial plexus disorders: Secondary | ICD-10-CM

## 2021-04-23 DIAGNOSIS — M5412 Radiculopathy, cervical region: Secondary | ICD-10-CM

## 2021-04-23 DIAGNOSIS — R200 Anesthesia of skin: Secondary | ICD-10-CM | POA: Diagnosis not present

## 2021-04-23 NOTE — Progress Notes (Signed)
?[facility] ? ? ? ?Provider:  Dr Jaynee Eagles ?Requesting Provider: Glenda Chroman, MD ?Primary Care Provider:  Glenda Chroman, MD ? ?CC:  weakness of left upper arm ? ?HPI:  Shane Osborne is a 81 y.o. male here as requested by Glenda Chroman, MD for weakness of left upper extremity.  Patient has a past medical history of hypercholesterolemia, elevated LFTs, esophagitis ulcerative, osteomyelitis, neck pain, urinary incontinence, obesity, interstitial lung disease, hypertension, impotence, kidney stones, palpitations, moderate aortic valve stenosis, cellulitis, gout, GERD, never smoked, no alcohol use.  I reviewed Dr. Woody Seller' notes, patient is having weakness in the left upper extremity, started March 05, 2021, progressive weakness and difficulty raising of the hand, I reviewed Dr. Woody Seller' examination which showed left upper extremity weakness, 4 out of 5 in the left upper extremity, hand grip, cranial nerve examination normal, gait is normal, physical exam normal.  An MRI of the brain was ordered as were carotid ultrasounds.  He has good muscle strength and grip in the hand.  Patient also possibly has cervical etiology, he has cervical degenerative joint disease or possibly lower motor neuron symptoms,.  I also reviewed physical therapy notes, from [facility] physical therapy, date of initial eval was March 20, 2021, he woke up 1 morning and could not lift his arms, he did have some mild tingling in the left upper arms, he was referred for brain MRI and ultrasound and EKG, he was referred for neck MRI as well, he reported to PT he really had no pain in his arm, occasional tingling in his shoulders at times, usually left greater than right, minimal improvement in strength and active function in the left upper extremity.  Examination showed shoulder flexion on the left 3- out of 5, shoulder abduction 3- out of 5 in the left, shoulder internal rotation 4 out of 5 in the left, shoulder external rotation 3- out of 5  on the left, elbow flexion 0 out of 5 on the left, elbow extension 4 out of 5 on the left, elbow supination 3- out of 5 in the left, elbow pronation 3+ out of 5 on the left, wrist flexion and wrist extension on the left 4 out of 5, he complains of radicular symptoms in the left upper extremity. ? ?He has had weakness in the left arm for many years, years ago he had a rotator cuff problem. He went through therapy an everything was ok he thought from the rotator cuff, didn't bother him enough. He has also had tingling in his left arm for months in either arm it would go from the neck all the way to the fingertips, definitely came from the neck, could be elicited by head position to the hands either side. No inciting events, just went to sleep regularly as usual and woke up one morning wit significant weakness in the left arm may have slept on the left arm. He has been performing PT and slightly better but not significant. Has to physically pick up his left arm, left arm dominant which has been very difficult. No new symptoms in the right arm or right leg since this started, basically stayed stable or slightly better in the left arm. No muscle twitches or muscle wasting, no unintentional weight loss, no problems with facial weakness or swallowing. No pain in the axilla. He is back on ASA since the mri results with chronic lacunar infarcts. No other focal neurologic deficits, associated symptoms, inciting events or modifiable factors. ? ?Reviewed  notes, labs and imaging from outside physicians, which showed: ?Imaging 02/2021:  ? ?MRI brain [facility] (Reviewed images of brain and c-spine brought by patient on CD from [facility] and agree):  ?1. No evidence of acute intracranial abnormality.  ?2. Chronic lacunar infarct within the posterior left lentiform  ?nucleus/left retro-lenticular white matter.  ?3. Background mild chronic small vessel ischemic changes within the  ?cerebral white matter.  ?4. Subcentimeter chronic infarct within  the left cerebellar  ?hemisphere.  ?5. Mild generalized cerebral atrophy.  ?6. Incompletely assessed upper cervical spondylosis. At C3-C4, there  ?is multifactorial suspected at least moderate spinal canal stenosis.  ?Given the provided history, consider a dedicated cervical spine MRI  ?for further evaluation.  ? ?MRI cervical spine: 1. Congenitally short pedicles, which narrow the AP diameter of the  ?spinal canal; when combined with mild degenerative disc disease,  ?there is mild spinal canal stenosis at C3-C4, C4-C5, C5-C6, C6-C7,  ?and C7-T1.  ?2. C3-C4 and C5-C6 severe left and moderate right neural foraminal  ?narrowing.  ?3. C2-C3 and C4-C5 severe left and mild right neural foraminal  ?narrowing.  ?4. C6-C7 severe right and moderate left neural foraminal narrowing. ? ?Review of Systems: ?Patient complains of symptoms per HPI as well as the following symptoms left arm weakness, numbness, insomnia, HTN, high cholesterol. Pertinent negatives and positives per HPI. All others negative. ? ? ?Social History  ? ?Socioeconomic History  ? Marital status: Married  ?  Spouse name: Not on file  ? Number of children: Not on file  ? Years of education: Not on file  ? Highest education level: Not on file  ?Occupational History  ? Not on file  ?Tobacco Use  ? Smoking status: Never  ? Smokeless tobacco: Never  ?Vaping Use  ? Vaping Use: Never used  ?Substance and Sexual Activity  ? Alcohol use: Yes  ?  Alcohol/week: 5.0 standard drinks  ?  Types: 5 Shots of liquor per week  ?  Comment: 2 oz in the afternoon  ? Drug use: No  ? Sexual activity: Not on file  ?Other Topics Concern  ? Not on file  ?Social History Narrative  ? Caffeine one cup daily.  Retired- drove truck.  Education HS. Married- 3 step children.  ? ?Social Determinants of Health  ? ?Financial Resource Strain: Not on file  ?Food Insecurity: Not on file  ?Transportation Needs: Not on file  ?Physical Activity: Not on file  ?Stress: Not on file  ?Social Connections:  Not on file  ?Intimate Partner Violence: Not on file  ? ? ?Family History  ?Problem Relation Age of Onset  ? Cancer Mother   ? Heart attack Father   ? ? ?Past Medical History:  ?Diagnosis Date  ? Aortic stenosis   ? Childhood asthma   ? Coronary atherosclerosis   ? Mild at cardiac catheterization 2018  ? Essential hypertension   ? GERD (gastroesophageal reflux disease)   ? Hyperlipidemia   ? ? ?Patient Active Problem List  ? Diagnosis Date Noted  ? Left arm weakness 04/23/2021  ? Left arm numbness 04/23/2021  ? Cervical radiculopathy 04/23/2021  ? Atypical chest pain 10/18/2020  ? Postinflammatory pulmonary fibrosis ([place]) 09/29/2019  ? Atrial fibrillation ([place]) 04/19/2017  ? Upper airway cough syndrome 08/19/2016  ? Dyspnea on exertion 05/19/2016  ? Morbid obesity due to excess calories ([place]) 05/19/2016  ? Abnormal nuclear cardiac imaging test 03/23/2016  ? Abnormal nuclear stress test   ? Heme positive stool 08/22/2013  ?  Hyperlipidemia 02/27/2009  ? Essential hypertension 02/27/2009  ? CHEST PAIN UNSPECIFIED 02/27/2009  ? ? ?Past Surgical History:  ?Procedure Laterality Date  ? BIOPSY  07/27/2019  ? Procedure: BIOPSY;  Surgeon: Rogene Houston, MD;  [facility];  Service: Endoscopy;;  ? COLONOSCOPY  10/01/2010  ? Procedure: COLONOSCOPY;  Surgeon: Rogene Houston, MD;  [facility];  Service: Endoscopy;  Laterality: N/A;  ? COLONOSCOPY N/A 09/01/2013  ? Procedure: COLONOSCOPY;  Surgeon: Rogene Houston, MD;  [facility];  Service: Endoscopy;  Laterality: N/A;  940  ? COLONOSCOPY N/A 07/27/2019  ? Procedure: COLONOSCOPY;  Surgeon: Rogene Houston, MD;  [facility];  Service: Endoscopy;  Laterality: N/A;  1030  ? ESOPHAGOGASTRODUODENOSCOPY    ? ESOPHAGOGASTRODUODENOSCOPY N/A 04/11/2014  ? Procedure: ESOPHAGOGASTRODUODENOSCOPY (EGD);  Surgeon: Rogene Houston, MD;  [facility];  Service: Endoscopy;  Laterality: N/A;  1200 - moved to 12:55 - Ann to notify pt  ?  ESOPHAGOGASTRODUODENOSCOPY N/A 07/27/2019  ? Procedure: ESOPHAGOGASTRODUODENOSCOPY (EGD);  Surgeon: Rogene Houston, MD;  [facility];  Service: Endoscopy;  Laterality: N/A;  ? LEFT HEART CATH AN

## 2021-04-23 NOTE — Patient Instructions (Signed)
MRI Chest to evaluate brachial plexus ?Cervical Radiculopathy ?Cervical radiculopathy happens when a nerve in the neck (a cervical nerve) is pinched or bruised. This condition can happen because of an injury to the cervical spine (vertebrae) in the neck, or as part of the normal aging process. Pressure on the cervical nerves can cause pain or numbness that travels from the neck all the way down to the arm and fingers. This condition usually gets better with rest. Treatment may be needed if the condition does not improve. ?What are the causes? ?This condition may be caused by: ?A neck injury. ?A bulging (herniated) disk. ?Muscle spasms. ?Muscle tightness in the neck due to overuse. ?Arthritis. ?Breakdown or degeneration in the bones and joints of the spine (spondylosis) due to aging. ?Bone spurs that may develop near the cervical nerves. ?What are the signs or symptoms? ?Symptoms of this condition include: ?Pain. The pain may travel from the neck to the arm and hand. The pain can be severe or irritating. It may get worse when you move your neck. ?Numbness or tingling in your arm or hand. ?Weakness in the affected arm and hand, in severe cases. ?How is this diagnosed? ?This condition may be diagnosed based on your symptoms, your medical history, and a physical exam. You may also have tests, including: ?X-rays. ?CT scan. ?MRI. ?Electromyogram (EMG). ?Nerve conduction tests. ?How is this treated? ?In many cases, treatment is not needed for this condition. With rest, the condition usually gets better over time. If treatment is needed, options may include: ?Wearing a soft neck collar (cervical collar) for short periods of time. ?Doing physical therapy to strengthen your neck muscles. ?Taking medicines. These may include NSAIDs, such as ibuprofen, or oral corticosteroids. ?Having spinal injections, in severe cases. ?Having surgery. This may be needed if other treatments do not help. Different types of surgery may be done  depending on the cause of this condition. ?Follow these instructions at home: ?If you have a cervical collar: ?Wear it as told by your health care provider. Remove it only as told by your health care provider. ?Ask your health care provider if you can remove the cervical collar for cleaning and bathing. If you are allowed to remove the collar for cleaning or bathing: ?Follow instructions from your health care provider about how to remove the collar safely. ?Clean the collar by wiping it with mild soap and water and drying it completely. ?Take out any removable pads in the collar every 1-2 days, and wash them by hand with soap and water. Let them air-dry completely before you put them back in the collar. ?Check your skin under the collar for irritation or sores. If you see any, tell your health care provider. ?Managing pain ?  ?Take over-the-counter and prescription medicines only as told by your health care provider. ?If directed, put ice on the affected area. To do this: ?If you have a soft neck collar, remove it as told by your health care provider. ?Put ice in a plastic bag. ?Place a towel between your skin and the bag. ?Leave the ice on for 20 minutes, 2-3 times a day. ?Remove the ice if your skin turns bright red. This is very important. If you cannot feel pain, heat, or cold, you have a greater risk of damage to the area. ?If applying ice does not help, you can try using heat. Use the heat source that your health care provider recommends, such as a moist heat pack or a heating pad. ?Place  a towel between your skin and the heat source. ?Leave the heat on for 20-30 minutes. ?Remove the heat if your skin turns bright red. This is especially important if you are unable to feel pain, heat, or cold. You have a greater risk of getting burned. ?Try a gentle neck and shoulder massage to help relieve symptoms. ?Activity ?Rest as needed. ?Return to your normal activities as told by your health care provider. Ask your  health care provider what activities are safe for you. ?Do stretching and strengthening exercises as told by your health care provider or your physical therapist. ?You may have to avoid lifting. Ask your health care provider how much you can safely lift. ?General instructions ?Use a flat pillow when you sleep. ?Do not drive while wearing a cervical collar. If you do not have a cervical collar, ask your health care provider if it is safe to drive while your neck heals. ?Ask your health care provider if the medicine prescribed to you requires you to avoid driving or using machinery. ?Do not use any products that contain nicotine or tobacco. These products include cigarettes, chewing tobacco, and vaping devices, such as e-cigarettes. If you need help quitting, ask your health care provider. ?Keep all follow-up visits. This is important. ?Contact a health care provider if: ?Your condition does not improve with treatment. ?Get help right away if: ?Your pain gets much worse and is not controlled with medicines. ?You have weakness or numbness in your hand, arm, face, or leg. ?You have a high fever. ?You have a stiff, rigid neck. ?You lose control of your bowels or your bladder (have incontinence). ?You have trouble with walking, balance, or speaking. ?Summary ?Cervical radiculopathy happens when a nerve in the neck is pinched or bruised. ?A nerve can get pinched from a bulging disk, arthritis, muscle spasms, or an injury to the neck. ?Symptoms include pain, tingling, or numbness radiating from the neck to the arm or hand. Weakness can also occur in severe cases. ?Treatment may include rest, wearing a cervical collar, and physical therapy. Medicines may be prescribed to help with pain. In severe cases, injections or surgery may be needed. ?This information is not intended to replace advice given to you by your health care provider. Make sure you discuss any questions you have with your health care provider. ?Document  Revised: 07/18/2020 Document Reviewed: 07/18/2020 ?Elsevier Patient Education ? [place]. ? ?

## 2021-04-24 ENCOUNTER — Telehealth: Payer: Self-pay | Admitting: Neurology

## 2021-04-24 DIAGNOSIS — I10 Essential (primary) hypertension: Secondary | ICD-10-CM | POA: Diagnosis not present

## 2021-04-24 LAB — BASIC METABOLIC PANEL
BUN/Creatinine Ratio: 15 (ref 10–24)
BUN: 14 mg/dL (ref 8–27)
CO2: 25 mmol/L (ref 20–29)
Calcium: 9.4 mg/dL (ref 8.6–10.2)
Chloride: 102 mmol/L (ref 96–106)
Creatinine, Ser: 0.95 mg/dL (ref 0.76–1.27)
Glucose: 114 mg/dL — ABNORMAL HIGH (ref 70–99)
Potassium: 4.2 mmol/L (ref 3.5–5.2)
Sodium: 142 mmol/L (ref 134–144)
eGFR: 80 mL/min/{1.73_m2} (ref >59)

## 2021-04-24 NOTE — Telephone Encounter (Signed)
I also mailed the MRI CD to [facility].  ?

## 2021-04-24 NOTE — Telephone Encounter (Signed)
[employer] medicare no auth req order sent to [facility] to be scheduled at [facility] ?

## 2021-04-24 NOTE — Telephone Encounter (Signed)
Sent to [facility] ph # [phone]. 

## 2021-04-27 ENCOUNTER — Other Ambulatory Visit: Payer: Self-pay | Admitting: Cardiology

## 2021-04-29 ENCOUNTER — Telehealth: Payer: Self-pay | Admitting: *Deleted

## 2021-04-29 NOTE — Telephone Encounter (Signed)
Spoke to patient gave lab results .Patient thanked me for calling  ?

## 2021-04-29 NOTE — Telephone Encounter (Signed)
-----   Message from Melvenia Beam, MD sent at 04/28/2021  8:06 PM EDT ----- ?Bmp unremarkable, thanks ?

## 2021-04-29 NOTE — Addendum Note (Signed)
Addended by: Sarina Ill B on: 04/29/2021 12:39 PM ? ? Modules accepted: Orders ? ?

## 2021-05-07 DIAGNOSIS — M5412 Radiculopathy, cervical region: Secondary | ICD-10-CM | POA: Diagnosis not present

## 2021-05-12 ENCOUNTER — Telehealth: Payer: Self-pay | Admitting: Neurology

## 2021-05-12 NOTE — Telephone Encounter (Signed)
Lvm for patient relaying this info. ?

## 2021-05-12 NOTE — Telephone Encounter (Signed)
Per office visit, the note says we cannot perform EMG/NCV here at this time, so the procedure has been deferred to [facility] and he was referred there. Patient needs to call [phone] to inquire.  ?

## 2021-05-12 NOTE — Telephone Encounter (Signed)
Pt claims he was to have a nerve conduction done and was wanting an update about scheduling that. Would like a call back.  ?

## 2021-05-13 ENCOUNTER — Ambulatory Visit (HOSPITAL_COMMUNITY)
Admission: RE | Admit: 2021-05-13 | Discharge: 2021-05-13 | Disposition: A | Discharge status: Home / Self Care | Payer: Medicare Other | Source: Ambulatory Visit | Attending: Neurology | Admitting: Neurology

## 2021-05-13 DIAGNOSIS — G540 Brachial plexus disorders: Secondary | ICD-10-CM | POA: Diagnosis not present

## 2021-05-13 DIAGNOSIS — R200 Anesthesia of skin: Secondary | ICD-10-CM | POA: Diagnosis not present

## 2021-05-13 DIAGNOSIS — M19012 Primary osteoarthritis, left shoulder: Secondary | ICD-10-CM | POA: Diagnosis not present

## 2021-05-13 DIAGNOSIS — R29898 Other symptoms and signs involving the musculoskeletal system: Secondary | ICD-10-CM | POA: Insufficient documentation

## 2021-05-13 DIAGNOSIS — M19011 Primary osteoarthritis, right shoulder: Secondary | ICD-10-CM | POA: Diagnosis not present

## 2021-05-13 DIAGNOSIS — M47812 Spondylosis without myelopathy or radiculopathy, cervical region: Secondary | ICD-10-CM | POA: Diagnosis not present

## 2021-05-13 DIAGNOSIS — R531 Weakness: Secondary | ICD-10-CM | POA: Diagnosis not present

## 2021-05-13 MED ORDER — GADOBUTROL 1 MMOL/ML IV SOLN
10.0000 mL | Freq: Once | INTRAVENOUS | Status: AC | PRN
  Administered 2021-05-13: 10 mL via INTRAVENOUS

## 2021-05-14 ENCOUNTER — Other Ambulatory Visit: Payer: Self-pay | Admitting: Neurology

## 2021-05-14 ENCOUNTER — Telehealth: Payer: Self-pay | Admitting: Neurology

## 2021-05-14 DIAGNOSIS — S46002A Unspecified injury of muscle(s) and tendon(s) of the rotator cuff of left shoulder, initial encounter: Secondary | ICD-10-CM

## 2021-05-14 DIAGNOSIS — R29898 Other symptoms and signs involving the musculoskeletal system: Secondary | ICD-10-CM

## 2021-05-14 NOTE — Telephone Encounter (Signed)
Please call and tell patient MRI showed that the left brachial plexus was normal but his rotator cuff appears severely affected and this may be the reason for his weakness and not the cervical radiculopathy. You can read this to him "Edema signal along the left supraspinatus and subscapularis musculature, and asymmetric mild muscle atrophy/loss of muscle bulk of the left rotator cuff musculature, could be due to denervation change versus chronic distal rotator cuff disease. This could be ?further evaluated with left shoulder MRI, if clinically indicated" ? ? ?I thnk we should complete a left shoulder MRI and I do think an emg/ncs is necessary prior to any intervention. Thanks see phone message. CC Dr. Zada Finders. ? ?If he is willing, I will order MRI of the left shoulder and await the emg/ncs results thanks.  ?

## 2021-05-14 NOTE — Telephone Encounter (Signed)
Spoke with patient and discussed the message as below in detail by Dr. Jaynee Eagles regarding patient's MRI result and suggested plan of care.  The patient is amenable to having an MRI of his left shoulder and also an EMG/NCS.  He is aware we would need results of the EMG prior to any intervention.  He will await a call from Dr. Barbra Sarks office regarding the EMG.  He will also await a call to schedule MRI after insurance authorization is obtained. ?

## 2021-05-14 NOTE — Telephone Encounter (Signed)
MRI has been ordered. EMG needs to be ordered. ?

## 2021-05-14 NOTE — Addendum Note (Signed)
Addended by: Gildardo Griffes on: 05/14/2021 12:28 PM ? ? Modules accepted: Orders ? ?

## 2021-05-14 NOTE — Telephone Encounter (Signed)
[employer] medicare no auth req sent to [facility] to be scheduled at [facility]  ?

## 2021-05-14 NOTE — Addendum Note (Signed)
Addended by: Gildardo Griffes on: 05/14/2021 02:28 PM ? ? Modules accepted: Orders ? ?

## 2021-05-20 ENCOUNTER — Telehealth: Payer: Self-pay | Admitting: Neurology

## 2021-05-20 DIAGNOSIS — R29898 Other symptoms and signs involving the musculoskeletal system: Secondary | ICD-10-CM

## 2021-05-20 NOTE — Telephone Encounter (Signed)
Order for NCV/EMG sent to [facility] [phone]. ?

## 2021-05-25 DIAGNOSIS — I10 Essential (primary) hypertension: Secondary | ICD-10-CM | POA: Diagnosis not present

## 2021-06-02 ENCOUNTER — Ambulatory Visit (HOSPITAL_COMMUNITY)
Admission: RE | Admit: 2021-06-02 | Discharge: 2021-06-02 | Disposition: A | Discharge status: Home / Self Care | Payer: Medicare Other | Source: Ambulatory Visit | Attending: Neurology | Admitting: Neurology

## 2021-06-02 DIAGNOSIS — R29898 Other symptoms and signs involving the musculoskeletal system: Secondary | ICD-10-CM | POA: Insufficient documentation

## 2021-06-02 DIAGNOSIS — M75112 Incomplete rotator cuff tear or rupture of left shoulder, not specified as traumatic: Secondary | ICD-10-CM | POA: Diagnosis not present

## 2021-06-02 DIAGNOSIS — S46002A Unspecified injury of muscle(s) and tendon(s) of the rotator cuff of left shoulder, initial encounter: Secondary | ICD-10-CM | POA: Insufficient documentation

## 2021-06-02 DIAGNOSIS — M19012 Primary osteoarthritis, left shoulder: Secondary | ICD-10-CM | POA: Diagnosis not present

## 2021-06-02 DIAGNOSIS — R531 Weakness: Secondary | ICD-10-CM | POA: Diagnosis not present

## 2021-06-04 NOTE — Progress Notes (Signed)
Patient has a significant rotator cuff muscular tear, atrophy of the musculature which is likely due to weakness and disuse and mild to moderate degenerative/arthritic changes.  I think his arm weakness may be due to rotator cuff problems and not his neck.  He needs to be seen by orthopedics for evaluation.  Please asked patient if he has already seen an orthopedic doctor, he may need surgery or treatment.  If he has an orthopedic doctor we can refer him back there, if he does not we can send him to [facility].

## 2021-06-09 ENCOUNTER — Telehealth: Payer: Self-pay | Admitting: *Deleted

## 2021-06-09 DIAGNOSIS — M75100 Unspecified rotator cuff tear or rupture of unspecified shoulder, not specified as traumatic: Secondary | ICD-10-CM

## 2021-06-09 NOTE — Telephone Encounter (Signed)
Spoke to patient gave results . Pt had questions about Neurosurgeon should he move forward with EMG that Dr Sherlyn Lick recommended Informed patient will send his question to Dr.Ahern and will send his referral to [facility] per Dr.Ahern request   ?

## 2021-06-09 NOTE — Telephone Encounter (Signed)
-----   Message from Melvenia Beam, MD sent at 06/04/2021  3:57 PM EDT ----- ?Patient has a significant rotator cuff muscular tear, atrophy of the musculature which is likely due to weakness and disuse and mild to moderate degenerative/arthritic changes.  I think his arm weakness may be due to rotator cuff problems and not his neck.  He needs to be seen by orthopedics for evaluation.  Please asked patient if he has already seen an orthopedic doctor, he may need surgery or treatment.  If he has an orthopedic doctor we can refer him back there, if he does not we can send him to [facility]. ?

## 2021-06-09 NOTE — Telephone Encounter (Signed)
This encounter was created in error - please disregard.

## 2021-06-10 ENCOUNTER — Telehealth: Payer: Self-pay | Admitting: Neurology

## 2021-06-10 NOTE — Telephone Encounter (Signed)
Referral for ORTHOPEDICS sent to [facility] [phone]. ?

## 2021-06-10 NOTE — Telephone Encounter (Signed)
Spoke to patient Per Dr.Ahern hold off on EMG until patient sees orthopedics. Pt expressed understanding and thanked me for calling  ?

## 2021-06-12 NOTE — Telephone Encounter (Signed)
Referral placed to hand center.

## 2021-06-12 NOTE — Addendum Note (Signed)
Addended by: Gildardo Griffes on: 06/12/2021 02:48 PM   Modules accepted: Orders

## 2021-06-12 NOTE — Telephone Encounter (Signed)
A regular referral is needed to be put in for evaluation at the hand center and they can determine if pt needs a NCV/EMG. Hand center is not accepting pts outside of the office for NCV/EMGS. 

## 2021-06-24 DIAGNOSIS — J069 Acute upper respiratory infection, unspecified: Secondary | ICD-10-CM | POA: Diagnosis not present

## 2021-06-24 DIAGNOSIS — Z299 Encounter for prophylactic measures, unspecified: Secondary | ICD-10-CM | POA: Diagnosis not present

## 2021-06-24 DIAGNOSIS — H109 Unspecified conjunctivitis: Secondary | ICD-10-CM | POA: Diagnosis not present

## 2021-06-24 DIAGNOSIS — I10 Essential (primary) hypertension: Secondary | ICD-10-CM | POA: Diagnosis not present

## 2021-07-04 DIAGNOSIS — R5383 Other fatigue: Secondary | ICD-10-CM | POA: Diagnosis not present

## 2021-07-04 DIAGNOSIS — Z7189 Other specified counseling: Secondary | ICD-10-CM | POA: Diagnosis not present

## 2021-07-04 DIAGNOSIS — I700 Atherosclerosis of aorta: Secondary | ICD-10-CM | POA: Diagnosis not present

## 2021-07-04 DIAGNOSIS — Z789 Other specified health status: Secondary | ICD-10-CM | POA: Diagnosis not present

## 2021-07-04 DIAGNOSIS — I10 Essential (primary) hypertension: Secondary | ICD-10-CM | POA: Diagnosis not present

## 2021-07-04 DIAGNOSIS — Z0000 Encounter for general adult medical examination without abnormal findings: Secondary | ICD-10-CM | POA: Diagnosis not present

## 2021-07-04 DIAGNOSIS — Z299 Encounter for prophylactic measures, unspecified: Secondary | ICD-10-CM | POA: Diagnosis not present

## 2021-07-04 DIAGNOSIS — E7800 Pure hypercholesterolemia, unspecified: Secondary | ICD-10-CM | POA: Diagnosis not present

## 2021-07-04 DIAGNOSIS — Z79899 Other long term (current) drug therapy: Secondary | ICD-10-CM | POA: Diagnosis not present

## 2021-07-08 DIAGNOSIS — M9907 Segmental and somatic dysfunction of upper extremity: Secondary | ICD-10-CM | POA: Diagnosis not present

## 2021-07-08 DIAGNOSIS — M25512 Pain in left shoulder: Secondary | ICD-10-CM | POA: Diagnosis not present

## 2021-07-16 DIAGNOSIS — C44629 Squamous cell carcinoma of skin of left upper limb, including shoulder: Secondary | ICD-10-CM | POA: Diagnosis not present

## 2021-07-16 DIAGNOSIS — L821 Other seborrheic keratosis: Secondary | ICD-10-CM | POA: Diagnosis not present

## 2021-07-31 DIAGNOSIS — L928 Other granulomatous disorders of the skin and subcutaneous tissue: Secondary | ICD-10-CM | POA: Diagnosis not present

## 2021-07-31 DIAGNOSIS — G5602 Carpal tunnel syndrome, left upper limb: Secondary | ICD-10-CM | POA: Diagnosis not present

## 2021-07-31 DIAGNOSIS — M6281 Muscle weakness (generalized): Secondary | ICD-10-CM | POA: Diagnosis not present

## 2021-08-09 ENCOUNTER — Other Ambulatory Visit: Payer: Self-pay | Admitting: Cardiology

## 2021-08-12 DIAGNOSIS — M25512 Pain in left shoulder: Secondary | ICD-10-CM | POA: Diagnosis not present

## 2021-08-12 DIAGNOSIS — G5602 Carpal tunnel syndrome, left upper limb: Secondary | ICD-10-CM | POA: Diagnosis not present

## 2021-08-12 DIAGNOSIS — M6281 Muscle weakness (generalized): Secondary | ICD-10-CM | POA: Diagnosis not present

## 2021-08-28 DIAGNOSIS — Z85828 Personal history of other malignant neoplasm of skin: Secondary | ICD-10-CM | POA: Diagnosis not present

## 2021-08-28 DIAGNOSIS — Z08 Encounter for follow-up examination after completed treatment for malignant neoplasm: Secondary | ICD-10-CM | POA: Diagnosis not present

## 2021-10-07 DIAGNOSIS — Z789 Other specified health status: Secondary | ICD-10-CM | POA: Diagnosis not present

## 2021-10-07 DIAGNOSIS — I739 Peripheral vascular disease, unspecified: Secondary | ICD-10-CM | POA: Diagnosis not present

## 2021-10-07 DIAGNOSIS — I25119 Atherosclerotic heart disease of native coronary artery with unspecified angina pectoris: Secondary | ICD-10-CM | POA: Diagnosis not present

## 2021-10-07 DIAGNOSIS — L309 Dermatitis, unspecified: Secondary | ICD-10-CM | POA: Diagnosis not present

## 2021-10-07 DIAGNOSIS — I10 Essential (primary) hypertension: Secondary | ICD-10-CM | POA: Diagnosis not present

## 2021-10-07 DIAGNOSIS — Z23 Encounter for immunization: Secondary | ICD-10-CM | POA: Diagnosis not present

## 2021-10-07 DIAGNOSIS — Z299 Encounter for prophylactic measures, unspecified: Secondary | ICD-10-CM | POA: Diagnosis not present

## 2021-11-19 ENCOUNTER — Other Ambulatory Visit: Payer: Self-pay | Admitting: Cardiology

## 2021-11-26 ENCOUNTER — Encounter: Payer: Self-pay | Admitting: Pulmonary Disease

## 2021-11-26 ENCOUNTER — Ambulatory Visit: Payer: Medicare Other | Admitting: Pulmonary Disease

## 2021-11-26 VITALS — BP 124/84 | HR 52 | Temp 98.7°F | Ht 69.0 in | Wt 192.2 lb

## 2021-11-26 DIAGNOSIS — R0609 Other forms of dyspnea: Secondary | ICD-10-CM | POA: Diagnosis not present

## 2021-11-26 DIAGNOSIS — I350 Nonrheumatic aortic (valve) stenosis: Secondary | ICD-10-CM

## 2021-11-26 DIAGNOSIS — J8410 Pulmonary fibrosis, unspecified: Secondary | ICD-10-CM | POA: Diagnosis not present

## 2021-11-26 DIAGNOSIS — J849 Interstitial pulmonary disease, unspecified: Secondary | ICD-10-CM | POA: Diagnosis not present

## 2021-11-26 NOTE — Assessment & Plan Note (Signed)
Repeat high-resolution CT chest to look for progression of fibrosis, if not then would be presumably postinflammatory/postinfectious

## 2021-11-26 NOTE — Assessment & Plan Note (Signed)
He has left diaphragm weakness, multifactorial dyspnea -Doubt that he needs PAP therapy at this time

## 2021-11-26 NOTE — Patient Instructions (Signed)
  X Amb sat  X echo for aortic stenosis  X HRCT chest

## 2021-11-26 NOTE — Assessment & Plan Note (Signed)
Agai, n his dyspnea is felt to be multifactorial - combi nation of aortic stenosis, diaphragm elevation and postinflammatory scarring.  We will obtain yearly follow-up echo to look for progression of aortic stenosis

## 2021-11-26 NOTE — Progress Notes (Signed)
   Subjective:    Patient ID: Shane Osborne, male    DOB: [birth year], 81 y.o.   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 He states that his dyspnea is unchanged.  He has lost 30 pounds since his last visit. He complains of weakness of his left arm and is seeing orthopedics He has not followed with cardiology .  He is able to sleep lying on his left side He admits to being sedentary, did not want to participate in center-based rehab  Significant tests/ events reviewed  HRCT 10/2020  , bandlike scarring and volume loss of RUL &  right lung base, with associated elevation of the right hemidiaphragm, tiny 47m nodules stable    HRCT 09/2019 >> no ILD, air trapping, RML nodules 48mstable from 05/2019 -Mild bronchiectasis at bases to my review   Echo 04/2019 normal LVEF, moderate aortic stenosis, peak gradient 29, elevated RVSP, mild MR   Cardiac CT 05/2019 >> Patchy areas of ground-glass attenuation and mild septal thickening  Aortic atherosclerosis. Elevation of the right hemidiaphragm    -Cath 03/23/16 and LVEDP 20      PFTs 10/2019 >> no airway obstruction, ratio 85, mild restriction with TLC 75%, 11% bronchodilator response, normal diffusion capacity - Spirometry 05/19/2016  FEV1 2.05 (89%)  Ratio 76 with classic upper airway truncation pattern   Review of Systems neg for any significant sore throat, dysphagia, itching, sneezing, nasal congestion or excess/ purulent secretions, fever, chills, sweats, unintended wt loss, pleuritic or exertional cp, hempoptysis, orthopnea pnd or change in chronic leg swelling. Also denies presyncope, palpitations, heartburn, abdominal pain, nausea, vomiting, diarrhea or change in bowel or urinary habits,  dysuria,hematuria, rash, arthralgias, visual complaints, headache, numbness weakness or ataxia.     Objective:   Physical Exam  Gen. Pleasant, well-nourished, in no distress ENT - no thrush, no pallor/icterus,no post nasal drip Neck: No JVD, no thyromegaly, no carotid bruits Lungs: no use of accessory muscles, no dullness to percussion, Rt basal rales no rhonchi  Cardiovascular: Rhythm regular, heart sounds  normal, no murmurs or gallops, no peripheral edema Musculoskeletal: No deformities, no cyanosis or clubbing        Assessment & Plan:

## 2021-12-04 ENCOUNTER — Ambulatory Visit (HOSPITAL_COMMUNITY)
Admission: RE | Admit: 2021-12-04 | Discharge: 2021-12-04 | Disposition: A | Discharge status: Home / Self Care | Payer: Medicare Other | Source: Ambulatory Visit | Attending: Pulmonary Disease | Admitting: Pulmonary Disease

## 2021-12-04 DIAGNOSIS — I350 Nonrheumatic aortic (valve) stenosis: Secondary | ICD-10-CM | POA: Insufficient documentation

## 2021-12-04 LAB — ECHOCARDIOGRAM COMPLETE
AR max vel: 0.84 cm2
AV Area VTI: 0.81 cm2
AV Area mean vel: 0.75 cm2
AV Mean grad: 23.5 mmHg
AV Peak grad: 40.3 mmHg
Ao pk vel: 3.18 m/s
Area-P 1/2: 2.37 cm2
S' Lateral: 2.9 cm

## 2021-12-04 NOTE — Progress Notes (Signed)
*  PRELIMINARY RESULTS* Echocardiogram 2D Echocardiogram has been performed.  Shane Osborne 12/04/2021, 12:46 PM

## 2021-12-29 DIAGNOSIS — L738 Other specified follicular disorders: Secondary | ICD-10-CM | POA: Diagnosis not present

## 2021-12-29 DIAGNOSIS — C44311 Basal cell carcinoma of skin of nose: Secondary | ICD-10-CM | POA: Diagnosis not present

## 2021-12-29 DIAGNOSIS — L218 Other seborrheic dermatitis: Secondary | ICD-10-CM | POA: Diagnosis not present

## 2021-12-29 DIAGNOSIS — L720 Epidermal cyst: Secondary | ICD-10-CM | POA: Diagnosis not present

## 2022-01-06 ENCOUNTER — Ambulatory Visit (HOSPITAL_COMMUNITY)
Admission: RE | Admit: 2022-01-06 | Discharge: 2022-01-06 | Disposition: A | Discharge status: Home / Self Care | Payer: Medicare Other | Source: Ambulatory Visit | Attending: Pulmonary Disease | Admitting: Pulmonary Disease

## 2022-01-06 DIAGNOSIS — J849 Interstitial pulmonary disease, unspecified: Secondary | ICD-10-CM | POA: Diagnosis not present

## 2022-01-06 DIAGNOSIS — I2510 Atherosclerotic heart disease of native coronary artery without angina pectoris: Secondary | ICD-10-CM | POA: Diagnosis not present

## 2022-01-06 DIAGNOSIS — I358 Other nonrheumatic aortic valve disorders: Secondary | ICD-10-CM | POA: Diagnosis not present

## 2022-01-06 DIAGNOSIS — R918 Other nonspecific abnormal finding of lung field: Secondary | ICD-10-CM | POA: Diagnosis not present

## 2022-01-08 DIAGNOSIS — I10 Essential (primary) hypertension: Secondary | ICD-10-CM | POA: Diagnosis not present

## 2022-01-08 DIAGNOSIS — Z299 Encounter for prophylactic measures, unspecified: Secondary | ICD-10-CM | POA: Diagnosis not present

## 2022-01-08 DIAGNOSIS — Z789 Other specified health status: Secondary | ICD-10-CM | POA: Diagnosis not present

## 2022-01-14 ENCOUNTER — Telehealth: Payer: Self-pay

## 2022-01-14 NOTE — Telephone Encounter (Signed)
Called Pt to give results of CT Scan but Pt had already been informed yesterday. I asked Pt if he had any questions or needed anything and stated no. Nothing further needed.

## 2022-01-14 NOTE — Telephone Encounter (Signed)
-----   Message from Rigoberto Noel, MD sent at 01/07/2022  5:01 PM EST ----- No change in scarring as prior. No evidence of pulmonary fibrosis. Nodules are unchanged from prior and considered benign

## 2022-01-28 ENCOUNTER — Other Ambulatory Visit: Payer: Self-pay | Admitting: Cardiology

## 2022-02-11 NOTE — Progress Notes (Signed)
Cardiology Office Note  Date: 02/12/2022   ID: [name], Guymon Jun 29, 1940, MRN [id number]  PCP:  Shane Chroman, MD  Cardiologist:  Shane Lesches, MD Electrophysiologist:  None   Chief Complaint  Patient presents with   Cardiac follow-up    History of Present Illness: Shane Osborne is an 82 y.o. male last seen by Shane Osborne in November 2022, I reviewed the note.  He is here for a routine visit.  I reviewed the last office note from Shane Osborne back in November 2023.  Shane Osborne reports dyspnea on exertion as before, NYHA class II-III depending on level of activity.  He tells me that he can walk a mile on level ground without much difficulty, but doing other chores such as vacuuming or cleaning up around the house is significantly more difficult.  He does not describe any chest tightness, no palpitations or syncope.  Echocardiogram from November 2023 revealed evidence of moderate to severe calcific aortic stenosis with mean gradient 24 mmHg and dimensionless index 0.29.  This progressed from mild to moderate aortic stenosis back in April 2022.  We discussed this today as well as plan for follow-up and also potential treatment options such as TAVR.  He is also bothered by cervical radiculopathy, left arm weakness is a predominant symptom.  He has been seen by neurology.  I reviewed his ECG today which shows sinus rhythm with PACs.  Past Medical History:  Diagnosis Date   Aortic stenosis    Childhood asthma    Coronary atherosclerosis    Mild at cardiac catheterization 2018   Essential hypertension    GERD (gastroesophageal reflux disease)    Hyperlipidemia     Current Outpatient Medications  Medication Sig Dispense Refill   albuterol (VENTOLIN HFA) 108 (90 Base) MCG/ACT inhaler Inhale 2 puffs into the lungs every 6 (six) hours as needed for wheezing or shortness of breath. 8 g 6   betamethasone valerate lotion (VALISONE) 0.1 % Apply 1 application topically daily as needed  for irritation.     carvedilol (COREG) 12.5 MG tablet TAKE 1 TABLET BY MOUTH TWICE DAILY 180 tablet 0   clotrimazole-betamethasone (LOTRISONE) cream Apply 1 application topically 2 (two) times daily. 30 g 11   fluticasone (CUTIVATE) 0.05 % cream Apply 1 application topically daily as needed (irritation).     furosemide (LASIX) 20 MG tablet TAKE 1 TABLET BY MOUTH EVERY DAY AS NEEDED FOR SWELLING 90 tablet 0   irbesartan (AVAPRO) 300 MG tablet TAKE 1 TABLET BY MOUTH EVERY DAY 30 tablet 6   ketoconazole (NIZORAL) 2 % cream Apply 1 application topically 2 (two) times daily as needed for irritation.     Multiple Vitamin (MULTIVITAMIN) tablet Take 1 tablet by mouth daily.     Omega-3 Fatty Acids (FISH OIL) 1200 MG CAPS Take 1 capsule by mouth 2 (two) times daily before a meal.      Probiotic Product (PROBIOTIC PO) Take 1 capsule by mouth daily.      rosuvastatin (CRESTOR) 20 MG tablet Take 20 mg by mouth daily.     No current facility-administered medications for this visit.   Allergies:  Other, Sulfa antibiotics, and Vancomycin   ROS: No orthopnea or PND.  Physical Exam: VS:  BP 138/75   Pulse 60   Ht '5\' 9"'$  (1.753 m)   Wt 192 lb 3.2 oz (87.2 kg)   SpO2 100%   BMI 28.38 kg/m , BMI Body mass index is 28.38  kg/m.  Wt Readings from Last 3 Encounters:  02/12/22 192 lb 3.2 oz (87.2 kg)  11/26/21 192 lb 3.2 oz (87.2 kg)  04/23/21 195 lb 12.8 oz (88.8 kg)    General: Patient appears comfortable at rest. HEENT: Conjunctiva and lids normal. Neck: Supple, no elevated JVP or carotid bruits. Lungs: Clear to auscultation, nonlabored breathing at rest. Cardiac: Regular rate and rhythm, no S3, 4-6/9 systolic murmur. Extremities: No pitting edema.  ECG:  An ECG dated 10/18/2020 was personally reviewed today and demonstrated:  Sinus rhythm with PACs and lead motion artifact.  Recent Labwork: 04/23/2021: BUN 14; Creatinine, Ser 0.95; Potassium 4.2; Sodium 142  June 2023: Hemoglobin 14.4, platelets  327, BUN 11, creatinine 1, potassium 4, AST 27, ALT 34, cholesterol 123, triglycerides 61, HDL 50, LDL 60, TSH 1.88  Other Studies Reviewed Today:  Lexiscan Myoview 10/29/2020:   Findings are consistent with no ischemia. The study is low risk.   No ST deviation was noted. Arrhythmias during stress: frequent PVCs. The ECG was negative for ischemia.   LV perfusion is normal in the setting of diaphragmatic attenuation.  There is an inferior/inferoseptal perfusion defect that is more prominent on rest imaging consistent with soft tissue attenuation, no definite ischemia.   Left ventricular function is normal. Nuclear stress EF: 53 %.   Diaphragmatic attenuation artifact was present.  Echocardiogram 12/04/2021:  1. Left ventricular ejection fraction, by estimation, is 60 to 65%. The  left ventricle has normal function. The left ventricle has no regional  wall motion abnormalities. Left ventricular diastolic parameters were  normal.   2. Right ventricular systolic function is low normal. The right  ventricular size is normal. There is normal pulmonary artery systolic  pressure.   3. The mitral valve is grossly normal. Trivial mitral valve  regurgitation. No evidence of mitral stenosis.   4. The aortic valve is abnormal. There is severe calcifcation of the  aortic valve. Aortic valve regurgitation is trivial. There is moderate to  severe aortic valve stenosis with V max 3.18 m/s, mean PG 24 mm Hg and  aortic valve area by VTI is 0.81 cm2.   5. The inferior vena cava is normal in size with greater than 50%  respiratory variability, suggesting right atrial pressure of 3 mmHg.   High Resolution Chest CT 12/122023: IMPRESSION: 1. Examination is somewhat limited by breath motion artifact. Within this limitation, there is again bland appearing scarring of the right-greater-than-left lung bases, with elevation of the right hemidiaphragm, as well as additional bandlike scarring in the anterior right  upper lobe. No evidence of fibrotic interstitial lung disease. 2. Occasional small pulmonary nodules are unchanged and definitively benign. No further follow-up required. No new nodules. 3. Lobular air trapping on expiratory phase imaging, consistent with small airways disease. 4. Coronary artery disease. 5. Aortic valve calcifications. Correlate for echocardiographic evidence of aortic valve dysfunction. 6. Cholelithiasis.  Assessment and Plan:  1.  Moderate to severe calcific degenerative aortic stenosis.  Echocardiogram from November 2023 revealed mean AV gradient 24 mmHg and dimensionless index 0.29.  This has progressed in comparison to prior study in 2022.  We discussed the implications today, I suspect this is certainly a component of his dyspnea on exertion, although likely still multifactorial as pointed out by Shane Osborne.  I did review his high-resolution chest CT results from December 2023 as well.  We will plan a follow-up echocardiogram in 6 months and clinical reassessment.  Certainly if things progress, we can have him  evaluated for possible TAVR candidacy.  2.  Nonobstructive CAD by prior cardiac catheterization.  Lexiscan Myoview in 2022 was low risk with no substantial ischemic burden.  Recommended continuing aspirin and Crestor.  Last LDL was 60.  Medication Adjustments/Labs and Tests Ordered: Current medicines are reviewed at length with the patient today.  Concerns regarding medicines are outlined above.   Tests Ordered: Orders Placed This Encounter  Procedures   EKG 12-Lead   ECHOCARDIOGRAM COMPLETE    Medication Changes: No orders of the defined types were placed in this encounter.   Disposition:  Follow up  6 months.  Signed, Satira Sark, MD, [facility] 02/12/2022 10:49 AM    [facility], [place], [place] [address] Phone: [phone]; Fax: [phone]

## 2022-02-12 ENCOUNTER — Encounter: Payer: Self-pay | Admitting: Cardiology

## 2022-02-12 ENCOUNTER — Ambulatory Visit: Payer: Medicare Other | Attending: Cardiology | Admitting: Cardiology

## 2022-02-12 VITALS — BP 138/75 | HR 60 | Ht 69.0 in | Wt 192.2 lb

## 2022-02-12 DIAGNOSIS — I350 Nonrheumatic aortic (valve) stenosis: Secondary | ICD-10-CM

## 2022-02-12 DIAGNOSIS — I25119 Atherosclerotic heart disease of native coronary artery with unspecified angina pectoris: Secondary | ICD-10-CM

## 2022-02-12 NOTE — Patient Instructions (Addendum)
Medication Instructions:  Your physician recommends that you continue on your current medications as directed. Please refer to the Current Medication list given to you today.  Labwork: none  Testing/Procedures: Your physician has requested that you have an echocardiogram in 6 months just before next visit in July 2024. Echocardiography is a painless test that uses sound waves to create images of your heart. It provides your doctor with information about the size and shape of your heart and how well your heart's chambers and valves are working. This procedure takes approximately one hour. There are no restrictions for this procedure. Please do NOT wear cologne, perfume, aftershave, or lotions (deodorant is allowed). Please arrive 15 minutes prior to your appointment time.  Follow-Up: Your physician recommends that you schedule a follow-up appointment in: 6 months  Any Other Special Instructions Will Be Listed Below (If Applicable).  If you need a refill on your cardiac medications before your next appointment, please call your pharmacy.

## 2022-02-19 DIAGNOSIS — Z85828 Personal history of other malignant neoplasm of skin: Secondary | ICD-10-CM | POA: Diagnosis not present

## 2022-02-19 DIAGNOSIS — Z08 Encounter for follow-up examination after completed treatment for malignant neoplasm: Secondary | ICD-10-CM | POA: Diagnosis not present

## 2022-04-09 DIAGNOSIS — I10 Essential (primary) hypertension: Secondary | ICD-10-CM | POA: Diagnosis not present

## 2022-04-09 DIAGNOSIS — Z299 Encounter for prophylactic measures, unspecified: Secondary | ICD-10-CM | POA: Diagnosis not present

## 2022-04-09 DIAGNOSIS — I739 Peripheral vascular disease, unspecified: Secondary | ICD-10-CM | POA: Diagnosis not present

## 2022-04-09 DIAGNOSIS — I700 Atherosclerosis of aorta: Secondary | ICD-10-CM | POA: Diagnosis not present

## 2022-04-09 DIAGNOSIS — I25119 Atherosclerotic heart disease of native coronary artery with unspecified angina pectoris: Secondary | ICD-10-CM | POA: Diagnosis not present

## 2022-04-20 DIAGNOSIS — Z299 Encounter for prophylactic measures, unspecified: Secondary | ICD-10-CM | POA: Diagnosis not present

## 2022-04-20 DIAGNOSIS — R52 Pain, unspecified: Secondary | ICD-10-CM | POA: Diagnosis not present

## 2022-04-20 DIAGNOSIS — M25462 Effusion, left knee: Secondary | ICD-10-CM | POA: Diagnosis not present

## 2022-04-20 DIAGNOSIS — M25562 Pain in left knee: Secondary | ICD-10-CM | POA: Diagnosis not present

## 2022-04-20 DIAGNOSIS — G319 Degenerative disease of nervous system, unspecified: Secondary | ICD-10-CM | POA: Diagnosis not present

## 2022-04-20 DIAGNOSIS — I10 Essential (primary) hypertension: Secondary | ICD-10-CM | POA: Diagnosis not present

## 2022-05-05 DIAGNOSIS — H0014 Chalazion left upper eyelid: Secondary | ICD-10-CM | POA: Diagnosis not present

## 2022-05-11 ENCOUNTER — Other Ambulatory Visit: Payer: Self-pay | Admitting: Cardiology

## 2022-06-10 DIAGNOSIS — X32XXXD Exposure to sunlight, subsequent encounter: Secondary | ICD-10-CM | POA: Diagnosis not present

## 2022-06-10 DIAGNOSIS — Z08 Encounter for follow-up examination after completed treatment for malignant neoplasm: Secondary | ICD-10-CM | POA: Diagnosis not present

## 2022-06-10 DIAGNOSIS — Z85828 Personal history of other malignant neoplasm of skin: Secondary | ICD-10-CM | POA: Diagnosis not present

## 2022-06-10 DIAGNOSIS — L570 Actinic keratosis: Secondary | ICD-10-CM | POA: Diagnosis not present

## 2022-07-07 DIAGNOSIS — Z299 Encounter for prophylactic measures, unspecified: Secondary | ICD-10-CM | POA: Diagnosis not present

## 2022-07-07 DIAGNOSIS — E7800 Pure hypercholesterolemia, unspecified: Secondary | ICD-10-CM | POA: Diagnosis not present

## 2022-07-07 DIAGNOSIS — Z7189 Other specified counseling: Secondary | ICD-10-CM | POA: Diagnosis not present

## 2022-07-07 DIAGNOSIS — Z79899 Other long term (current) drug therapy: Secondary | ICD-10-CM | POA: Diagnosis not present

## 2022-07-07 DIAGNOSIS — I25119 Atherosclerotic heart disease of native coronary artery with unspecified angina pectoris: Secondary | ICD-10-CM | POA: Diagnosis not present

## 2022-07-07 DIAGNOSIS — R5383 Other fatigue: Secondary | ICD-10-CM | POA: Diagnosis not present

## 2022-07-07 DIAGNOSIS — Z0000 Encounter for general adult medical examination without abnormal findings: Secondary | ICD-10-CM | POA: Diagnosis not present

## 2022-07-07 DIAGNOSIS — I739 Peripheral vascular disease, unspecified: Secondary | ICD-10-CM | POA: Diagnosis not present

## 2022-07-07 DIAGNOSIS — I700 Atherosclerosis of aorta: Secondary | ICD-10-CM | POA: Diagnosis not present

## 2022-07-07 DIAGNOSIS — I10 Essential (primary) hypertension: Secondary | ICD-10-CM | POA: Diagnosis not present

## 2022-07-27 ENCOUNTER — Other Ambulatory Visit: Payer: Medicare Other

## 2022-07-28 NOTE — Progress Notes (Unsigned)
    Cardiology Office Note  Date: 07/29/2022   ID: Shane Osborne, Shane Osborne 1940-04-27, MRN [id number]  History of Present Illness: Shane Osborne is an 82 y.o. male last seen in January.  He is here for a routine visit.  Reports no specific change in stamina, NYHA class II dyspnea, no exertional chest pain or syncope.  I reviewed his follow-up echocardiogram today which shows LVEF 60 to 65% and moderate to severe calcific aortic stenosis with relatively stable mean AV gradient of 22 mmHg and dimensionless index of 0.26.  We went over his medications today, discussed decreasing aspirin to every other day given frequent bruising.  He is tolerating Crestor well with last LDL 56.  Physical Exam: VS:  BP 128/80   Pulse (!) 52   Ht 5\' 9"  (1.753 m)   Wt 189 lb (85.7 kg)   SpO2 97%   BMI 27.91 kg/m , BMI Body mass index is 27.91 kg/m.  Wt Readings from Last 3 Encounters:  07/29/22 189 lb (85.7 kg)  02/12/22 192 lb 3.2 oz (87.2 kg)  11/26/21 192 lb 3.2 oz (87.2 kg)    General: Patient appears comfortable at rest. HEENT: Conjunctiva and lids normal. Neck: Supple, no elevated JVP or carotid bruits. Lungs: Clear to auscultation, nonlabored breathing at rest. Cardiac: Regular rate and rhythm, no S3, 3/6 systolic murmur. Extremities: No pitting edema.  ECG:  An ECG dated 02/12/2022 was personally reviewed today and demonstrated:  Sinus rhythm with PACs.  Labwork:  June 2024: Hemoglobin 15.1, platelets 218, 110, creatinine 1.04, potassium 3.8, AST 30, ALT 18, cholesterol 126, triglycerides 60, HDL 57, LDL 56, TSH 2.51  Other Studies Reviewed Today:  Echocardiogram 07/29/2022:  1. Left ventricular ejection fraction, by estimation, is 60 to 65%. The  left ventricle has normal function. The left ventricle has no regional  wall motion abnormalities. Left ventricular diastolic parameters are  consistent with Grade I diastolic  dysfunction (impaired relaxation).   2. RV-RA gradient 31 mmHg  suggesting upper normal to at least mildly  increased RVSP depending on CVP. Right ventricular systolic function is  normal. The right ventricular size is normal.   3. Left atrial size was mildly dilated.   4. The mitral valve is degenerative. Mild mitral valve regurgitation.   5. The aortic valve is tricuspid. There is moderate calcification of the  aortic valve. Aortic valve regurgitation is trivial. Moderate to severe  aortic valve stenosis. Aortic valve mean gradient measures 22.0 mmHg.  Dimentionless index 0.26.   6. Unable to estimate CVP.   Assessment and Plan:  1.  Degenerative calcific aortic stenosis, moderate to severe range by follow-up echocardiogram today.  Mean AV gradient 22 mmHg and dimensionless index 0.26.  He reports no definite change in stamina, no new exertional symptoms.  Plan will be to obtain a follow-up echocardiogram in 6 months with clinical reevaluation.  2.  Nonobstructive CAD documented at cardiac catheterization in 2018.  Follow-up Lexiscan Myoview in 2022 was low risk.  Decrease aspirin to 81 mg every other day and continue statin therapy.  3.  Mixed hyperlipidemia, LDL 56 in June.  Continue Crestor.  Disposition:  Follow up  6 months.  Signed, Jonelle Sidle, M.D., F.A.C.C. [facility]

## 2022-07-29 ENCOUNTER — Ambulatory Visit (INDEPENDENT_AMBULATORY_CARE_PROVIDER_SITE_OTHER): Payer: Medicare Other

## 2022-07-29 ENCOUNTER — Ambulatory Visit: Payer: Medicare Other | Attending: Cardiology | Admitting: Cardiology

## 2022-07-29 ENCOUNTER — Encounter: Payer: Self-pay | Admitting: Cardiology

## 2022-07-29 VITALS — BP 128/80 | HR 52 | Ht 69.0 in | Wt 189.0 lb

## 2022-07-29 DIAGNOSIS — E782 Mixed hyperlipidemia: Secondary | ICD-10-CM

## 2022-07-29 DIAGNOSIS — I350 Nonrheumatic aortic (valve) stenosis: Secondary | ICD-10-CM

## 2022-07-29 DIAGNOSIS — I25119 Atherosclerotic heart disease of native coronary artery with unspecified angina pectoris: Secondary | ICD-10-CM | POA: Diagnosis not present

## 2022-07-29 LAB — ECHOCARDIOGRAM COMPLETE
AR max vel: 1.31 cm2
AV Area VTI: 1.17 cm2
AV Area mean vel: 1.16 cm2
AV Mean grad: 22 mmHg
AV Peak grad: 36 mmHg
Ao pk vel: 3 m/s
Area-P 1/2: 3.33 cm2
Calc EF: 63.6 %
MV VTI: 1.96 cm2
S' Lateral: 2.4 cm
Single Plane A2C EF: 57.9 %
Single Plane A4C EF: 65 %

## 2022-07-29 MED ORDER — ASPIRIN 81 MG PO TBEC: 81.0000 mg | DELAYED_RELEASE_TABLET | ORAL | 3 refills | Status: DC

## 2022-07-29 NOTE — Patient Instructions (Addendum)
Medication Instructions:  Your physician has recommended you make the following change in your medication: Change aspirin 81 mg to every other day Continue other medications as prescirbed  Labwork: none  Testing/Procedures: Your physician has requested that you have an echocardiogram in 6 months just before your next visit. Echocardiography is a painless test that uses sound waves to create images of your heart. It provides your doctor with information about the size and shape of your heart and how well your heart's chambers and valves are working. This procedure takes approximately one hour. There are no restrictions for this procedure. Please do NOT wear cologne, perfume, aftershave, or lotions (deodorant is allowed). Please arrive 15 minutes prior to your appointment time.  Follow-Up: Your physician recommends that you schedule a follow-up appointment in: 6 months  Any Other Special Instructions Will Be Listed Below (If Applicable).  If you need a refill on your cardiac medications before your next appointment, please call your pharmacy.

## 2022-08-18 DIAGNOSIS — R109 Unspecified abdominal pain: Secondary | ICD-10-CM | POA: Diagnosis not present

## 2022-08-18 DIAGNOSIS — K219 Gastro-esophageal reflux disease without esophagitis: Secondary | ICD-10-CM | POA: Diagnosis not present

## 2022-08-18 DIAGNOSIS — I10 Essential (primary) hypertension: Secondary | ICD-10-CM | POA: Diagnosis not present

## 2022-08-18 DIAGNOSIS — Z299 Encounter for prophylactic measures, unspecified: Secondary | ICD-10-CM | POA: Diagnosis not present

## 2022-08-26 ENCOUNTER — Encounter (INDEPENDENT_AMBULATORY_CARE_PROVIDER_SITE_OTHER): Payer: Self-pay | Admitting: *Deleted

## 2022-09-02 ENCOUNTER — Ambulatory Visit (INDEPENDENT_AMBULATORY_CARE_PROVIDER_SITE_OTHER): Payer: Medicare Other | Admitting: Gastroenterology

## 2022-09-02 ENCOUNTER — Encounter (INDEPENDENT_AMBULATORY_CARE_PROVIDER_SITE_OTHER): Payer: Self-pay | Admitting: Gastroenterology

## 2022-09-02 VITALS — BP 137/63 | HR 84 | Temp 98.2°F | Ht 68.0 in | Wt 184.0 lb

## 2022-09-02 DIAGNOSIS — R634 Abnormal weight loss: Secondary | ICD-10-CM

## 2022-09-02 DIAGNOSIS — K641 Second degree hemorrhoids: Secondary | ICD-10-CM | POA: Diagnosis not present

## 2022-09-02 DIAGNOSIS — K279 Peptic ulcer, site unspecified, unspecified as acute or chronic, without hemorrhage or perforation: Secondary | ICD-10-CM | POA: Insufficient documentation

## 2022-09-02 DIAGNOSIS — R1032 Left lower quadrant pain: Secondary | ICD-10-CM | POA: Insufficient documentation

## 2022-09-02 DIAGNOSIS — K5904 Chronic idiopathic constipation: Secondary | ICD-10-CM | POA: Insufficient documentation

## 2022-09-02 MED ORDER — POLYETHYLENE GLYCOL 3350 17 G PO PACK: 17.0000 g | PACK | Freq: Two times a day (BID) | ORAL | 0 refills | Status: DC

## 2022-09-02 MED ORDER — PSYLLIUM 58.6 % PO PACK: 1.0000 | PACK | Freq: Two times a day (BID) | ORAL | 2 refills | Status: AC

## 2022-09-02 MED ORDER — PSYLLIUM 58.6 % PO PACK: 1.0000 | PACK | Freq: Two times a day (BID) | ORAL | 2 refills | Status: DC

## 2022-09-02 MED ORDER — POLYETHYLENE GLYCOL 3350 17 G PO PACK: 17.0000 g | PACK | Freq: Two times a day (BID) | ORAL | 2 refills | Status: AC

## 2022-09-02 NOTE — Patient Instructions (Signed)
It was very nice to meet you today, as dicussed with will plan for the following :  1) Avoid NSAID, ( diclofenac ) , take tylenol 2) Ensure adequate fluid intake: Aim for 8 glasses of water daily. Follow a high fiber diet: Include foods such as dates, prunes, pears, and kiwi. Take Miralax twice a day for the first week, then reduce to once daily thereafter. Use Metamucil twice a day.  3)Ct abdomen and pelvis

## 2022-09-02 NOTE — Progress Notes (Signed)
Shane Osborne , M.D. Gastroenterology & Hepatology [facility] Gastroenterology [address] [place], [place] [address] Primary Care Physician: Ignatius Specking, MD [address] [place] [address]  Chief Complaint:  Hematochezia,weight loss, abdominal discomfort , PUD   History of Present Illness: Shane Osborne is a 82 y.o. male with CAD , PUD, HTN , GERD , HLD AS  who presents for evaluation of Painless hematochezia,weight loss, abdominal discomfort  and PUD .  Past patient is noticing left lower quadrant discomfort and heaviness.  He feels as if there is a bulge in the left lower quadrant and has constant discomfort feeling like a pulling sensation.  Patient reports altered bowel movement with occasional hard stools and diarrhea with straining.  He has noticed fresh blood on wiping on the toilet.  He said he tries to eat healthy and walks daily but has lost 40 pounds in past 1.  Stepdaughter was diagnosed with pancreatic cancer and patient is concerned.  He has never been a smoker.  Patient is taking diclofenac acid for headaches.The patient denies having any nausea, vomiting, fever, chills, melena, hematemesis,  jaundice, pruritus.  Last XBJ:[id number]  The examined esophagus was normal.  The Z- line was regular and was found 39 cm from the incisors.  One non- bleeding superficial gastric ulcer with pigmented material was found on the lesser curvature of the gastric antrum. The lesion was three mm by five mm in largest dimension. Biopsies were taken with a cold forceps for histology. The pathology specimen was placed into Bottle Number 1.  Patchy mild inflammation characterized by congestion ( edema) , erosions and erythema was found in the gastric antrum and in the prepyloric region of the stomach. The duodenal bulb and second portion of the duodenum were normal.  Last Colonoscopy:2021  - Diverticulosis in the sigmoid colon and at the hepatic flexure. - One  small polyp at the hepatic flexure, removed with a cold snare. Resected and retrieved. - Medium- sized lipoma in the descending colon. - External and internal hemorrhoids.  A. ANTRUM, BIOPSY:  - Gastric antral and oxyntic mucosa with nonspecific reactive  gastropathy  - Warthin Starry stain is negative for Helicobacter pylori   B. COLON, HEPATIC FLEXURE, POLYPECTOMY:  - Tubular adenoma(s)  - Negative for high-grade dysplasia or malignancy   FHx: neg for any gastrointestinal/liver disease, no malignancies Social: neg smoking, alcohol or illicit drug use Surgical: no abdominal surgeries  Past Medical History: Past Medical History:  Diagnosis Date   Aortic stenosis    Childhood asthma    Coronary atherosclerosis    Mild at cardiac catheterization 2018   Essential hypertension    GERD (gastroesophageal reflux disease)    Hyperlipidemia     Past Surgical History: Past Surgical History:  Procedure Laterality Date   BIOPSY  07/27/2019   Procedure: BIOPSY;  Surgeon: Malissa Hippo, MD;  [facility];  Service: Endoscopy;;   COLONOSCOPY  10/01/2010   Procedure: COLONOSCOPY;  Surgeon: Malissa Hippo, MD;  [facility];  Service: Endoscopy;  Laterality: N/A;   COLONOSCOPY N/A 09/01/2013   Procedure: COLONOSCOPY;  Surgeon: Malissa Hippo, MD;  [facility];  Service: Endoscopy;  Laterality: N/A;  940   COLONOSCOPY N/A 07/27/2019   Procedure: COLONOSCOPY;  Surgeon: Malissa Hippo, MD;  [facility];  Service: Endoscopy;  Laterality: N/A;  1030   ESOPHAGOGASTRODUODENOSCOPY     ESOPHAGOGASTRODUODENOSCOPY N/A 04/11/2014  Procedure: ESOPHAGOGASTRODUODENOSCOPY (EGD);  Surgeon: Malissa Hippo, MD;  [facility];  Service: Endoscopy;  Laterality: N/A;  1200 - moved to 12:55 - Ann to notify pt   ESOPHAGOGASTRODUODENOSCOPY N/A 07/27/2019   Procedure: ESOPHAGOGASTRODUODENOSCOPY (EGD);  Surgeon: Malissa Hippo, MD;  [facility];   Service: Endoscopy;  Laterality: N/A;   LEFT HEART CATH AND CORONARY ANGIOGRAPHY N/A 03/23/2016   Procedure: Left Heart Cath and Coronary Angiography;  Surgeon: Tonny Bollman, MD;  Location: [facility] INVASIVE CV LAB;  Service: Cardiovascular;  Laterality: N/A;   POLYPECTOMY  07/27/2019   Procedure: POLYPECTOMY;  Surgeon: Malissa Hippo, MD;  [facility];  Service: Endoscopy;;   right leg  20 years ago   X 8 secondary to fracture   rt inguinal      hernia repair 2 months ago    Family History: Family History  Problem Relation Age of Onset   Cancer Mother    Heart attack Father     Social History: Social History   Tobacco Use  Smoking Status Never  Smokeless Tobacco Never   Social History   Substance and Sexual Activity  Alcohol Use Yes   Alcohol/week: 5.0 standard drinks of alcohol   Types: 5 Shots of liquor per week   Comment: 2 oz in the afternoon   Social History   Substance and Sexual Activity  Drug Use No    Allergies: Allergies  Allergen Reactions   Other     [place] nuts - break out in hive throat swelling    Sulfa Antibiotics Rash   Vancomycin Rash    Medications: Current Outpatient Medications  Medication Sig Dispense Refill   albuterol (VENTOLIN HFA) 108 (90 Base) MCG/ACT inhaler Inhale 2 puffs into the lungs every 6 (six) hours as needed for wheezing or shortness of breath. 8 g 6   aspirin EC 81 MG tablet Take 1 tablet (81 mg total) by mouth every other day. Swallow whole. 15 tablet 3   betamethasone valerate lotion (VALISONE) 0.1 % Apply 1 application topically daily as needed for irritation.     carvedilol (COREG) 12.5 MG tablet TAKE 1 TABLET BY MOUTH TWICE DAILY 180 tablet 1   clotrimazole-betamethasone (LOTRISONE) cream Apply 1 application topically 2 (two) times daily. 30 g 11   diclofenac (VOLTAREN) 75 MG EC tablet Take 75 mg by mouth 2 (two) times daily.     fluticasone (CUTIVATE) 0.05 % cream Apply 1 application topically daily as  needed (irritation).     furosemide (LASIX) 20 MG tablet TAKE 1 TABLET BY MOUTH EVERY DAY AS NEEDED FOR SWELLING 90 tablet 0   irbesartan (AVAPRO) 300 MG tablet TAKE 1 TABLET BY MOUTH EVERY DAY 30 tablet 6   ketoconazole (NIZORAL) 2 % cream Apply 1 application topically 2 (two) times daily as needed for irritation.     Multiple Vitamin (MULTIVITAMIN) tablet Take 1 tablet by mouth daily.     Omega-3 Fatty Acids (FISH OIL) 1200 MG CAPS Take 1 capsule by mouth 2 (two) times daily before a meal.      Probiotic Product (PROBIOTIC PO) Take 1 capsule by mouth daily.      rosuvastatin (CRESTOR) 20 MG tablet Take 20 mg by mouth daily.     No current facility-administered medications for this visit.    Review of Systems: GENERAL: negative for malaise, night sweats HEENT: No changes in hearing or vision, no nose bleeds or other nasal problems. NECK: Negative for lumps, goiter,  pain and significant neck swelling RESPIRATORY: Negative for cough, wheezing CARDIOVASCULAR: Negative for chest pain, leg swelling, palpitations, orthopnea GI: SEE HPI MUSCULOSKELETAL: Negative for joint pain or swelling, back pain, and muscle pain. SKIN: Negative for lesions, rash HEMATOLOGY Negative for prolonged bleeding, bruising easily, and swollen nodes. ENDOCRINE: Negative for cold or heat intolerance, polyuria, polydipsia and goiter. NEURO: negative for tremor, gait imbalance, syncope and seizures. The remainder of the review of systems is noncontributory.   Physical Exam: BP 137/63 (BP Location: Left Arm, Patient Position: Sitting, Cuff Size: Normal)   Pulse 84   Temp 98.2 F (36.8 C) (Oral)   Ht 5\' 8"  (1.727 m)   Wt 184 lb (83.5 kg)   BMI 27.98 kg/m  GENERAL: The patient is AO x3, in no acute distress. HEENT: Head is normocephalic and atraumatic. EOMI are intact. Mouth is well hydrated and without lesions. NECK: Supple. No masses LUNGS: Clear to auscultation. No presence of rhonchi/wheezing/rales. Adequate  chest expansion HEART: RRR, normal s1 and s2. ABDOMEN: Soft, nontender, no guarding, no peritoneal signs, and nondistended. BS +. No masses. EXTREMITIES: Without any cyanosis, clubbing, rash, lesions or edema. NEUROLOGIC: AOx3, no focal motor deficit. SKIN: no jaundice, no rashes   Imaging/Labs: as above  I personally reviewed and interpreted the available labs, imaging and endoscopic files.  Impression and Plan: FERDINANDO TRAINO is a 82 y.o. male with CAD , PUD, HTN , GERD , HLD AS  who presents for evaluation of Painless hematochezia,weight loss, abdominal discomfort  and PUD .  #Painless hematochezia   Patient is up-to-date to his colonoscopy last performed in 2021, could prep with 1 diminutive polyp recommended colonoscopy would be in 7 years but given patient advanced age decision to pursue colonoscopy is individualized  As patient is up to due to colonoscopy and noticing fresh blood only upon wiping I assume this is due to internal/external hemorrhoids seen in last colonoscopy with underlying constipation  Will continue to monitor patient and if after managing constipation patient continues to see blood we will consider early colonoscopy  #Constipation  Patient has altered bowel movements with constipation and diarrhea with occasional straining  Recs: Ensure adequate fluid intake: Aim for 8 glasses of water daily. Follow a high fiber diet: Include foods such as dates, prunes, pears, and kiwi. Take Miralax twice a day for the first week, then reduce to once daily thereafter. Use Metamucil twice a day.  # Abdominal pain and discomfort #Weight loss  Patient is having a new left lower quadrant abdominal pain and discomfort.  Feels like a bulge in the left lower quadrant although exam is benign today and no signs of hernia.  He reports of losing 40 pounds in past 1 year although is trying to eat healthy and walking every day.  Patient is concerned about weight loss because  stepdaughter was diagnosed with pancreatic cancer.  Given new GI symptoms and advanced age with weight loss is reasonable to pursue cross-sectional imaging of the abdomen  Will get CT abdomen pelvis with IV contrast  #Peptic ulcer disease  Last upper endoscopy 2021 with a very small superficial ulcer biopsy negative for H. Pylori.  Patient is taking diclofenac for headaches  Advised to avoid NSAIDs given history of peptic ulcer disease and take Tylenol rather If patient continues to have abdominal symptoms we will consider upper endoscopy to assess healing of the ulcer.  Although given the ulcer was very small repeat endoscopy to assess healing of ulcer is not necessarily  recommended in these cases  Patient to return to clinic to evaluate resolution of symptoms.  All questions were answered.      Shane Lawman, MD Gastroenterology and Hepatology [facility]   This chart has been completed using [facility] Dictation software, and while attempts have been made to ensure accuracy , certain words and phrases may not be transcribed as intended

## 2022-09-16 DIAGNOSIS — L218 Other seborrheic dermatitis: Secondary | ICD-10-CM | POA: Diagnosis not present

## 2022-09-17 ENCOUNTER — Ambulatory Visit (HOSPITAL_COMMUNITY)
Admission: RE | Admit: 2022-09-17 | Discharge: 2022-09-17 | Disposition: A | Discharge status: Home / Self Care | Payer: Medicare Other | Source: Ambulatory Visit | Attending: Gastroenterology | Admitting: Gastroenterology

## 2022-09-17 DIAGNOSIS — R634 Abnormal weight loss: Secondary | ICD-10-CM | POA: Insufficient documentation

## 2022-09-17 DIAGNOSIS — N200 Calculus of kidney: Secondary | ICD-10-CM | POA: Diagnosis not present

## 2022-09-17 DIAGNOSIS — K8020 Calculus of gallbladder without cholecystitis without obstruction: Secondary | ICD-10-CM | POA: Diagnosis not present

## 2022-09-17 DIAGNOSIS — K5730 Diverticulosis of large intestine without perforation or abscess without bleeding: Secondary | ICD-10-CM | POA: Diagnosis not present

## 2022-09-17 MED ORDER — IOHEXOL 9 MG/ML PO SOLN
ORAL | Status: AC
  Filled 2022-09-17: qty 500

## 2022-09-17 MED ORDER — IOHEXOL 300 MG/ML  SOLN
100.0000 mL | Freq: Once | INTRAMUSCULAR | Status: AC | PRN
Start: 2022-09-17 — End: 2022-09-17
  Administered 2022-09-17: 100 mL via INTRAVENOUS

## 2022-09-22 ENCOUNTER — Telehealth (INDEPENDENT_AMBULATORY_CARE_PROVIDER_SITE_OTHER): Payer: Self-pay | Admitting: *Deleted

## 2022-09-22 IMAGING — CT CT CHEST HIGH RESOLUTION W/O CM
3 of 5 series · 14 of 36 positions shown, 15 images · non-contrast
Comparison: 10/23/2019, 06/08/2019, 02/13/2009

CLINICAL DATA: Interstitial lung disease, shortness of breath,
pulmonary nodules

EXAM:
CT CHEST WITHOUT CONTRAST
TECHNIQUE: Multidetector CT imaging of the chest was performed following the
standard protocol without intravenous contrast. High resolution
imaging of the lungs, as well as inspiratory and expiratory imaging,
was performed.

[Series 3: standard chest · axial · 0.74mm/px · z∈[+462,+726]mm · 8 of 164 slices shown]
[im 16/164  mediastinal]
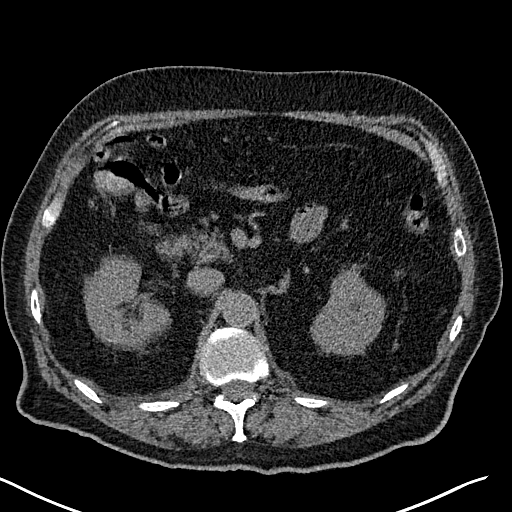
[im 32/164  mediastinal]
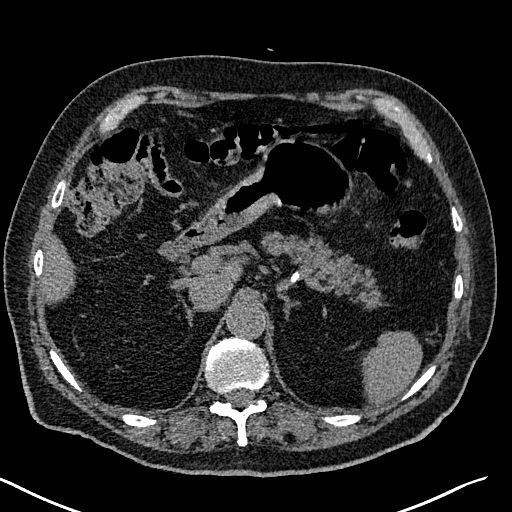
[im 55/164  mediastinal]
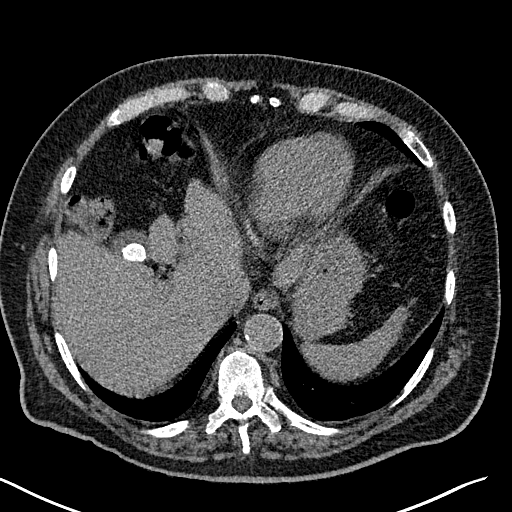
[im 70/164  mediastinal]
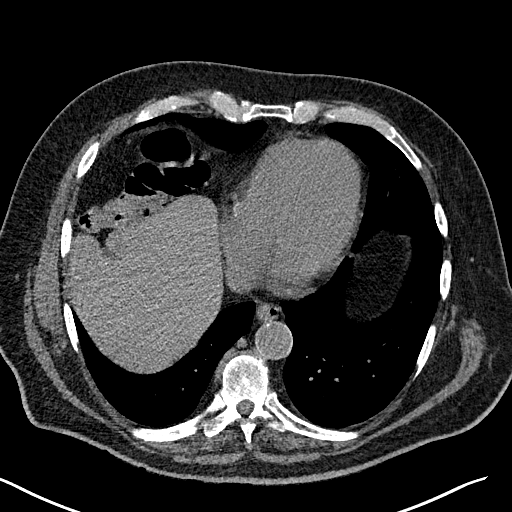
[im 94/164  mediastinal]
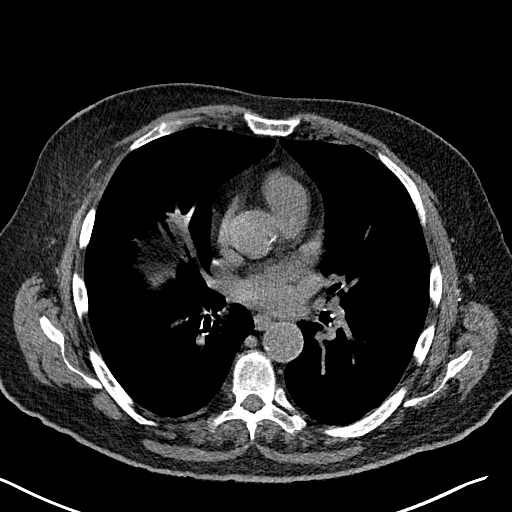
[im 109/164  mediastinal]
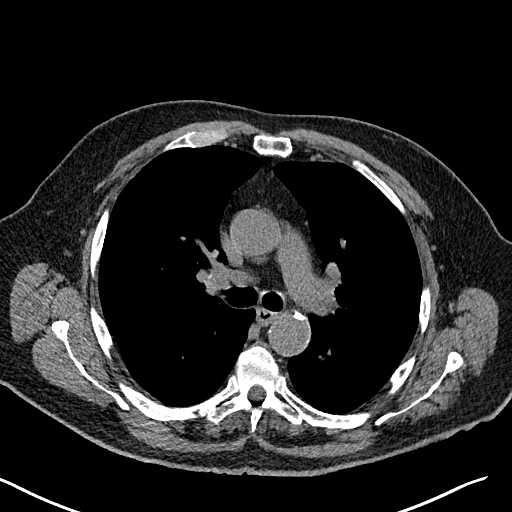
[im 132/164  mediastinal]
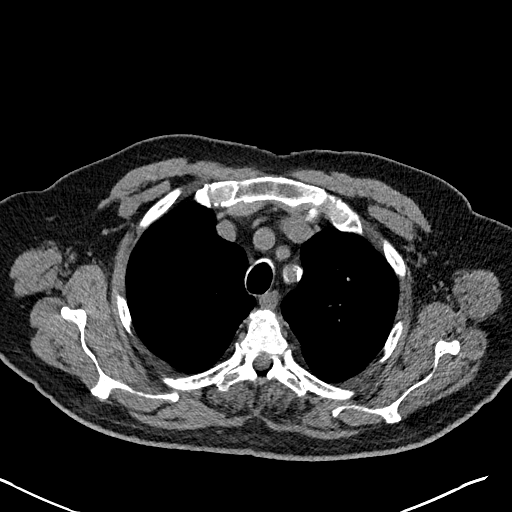
[im 148/164  mediastinal]
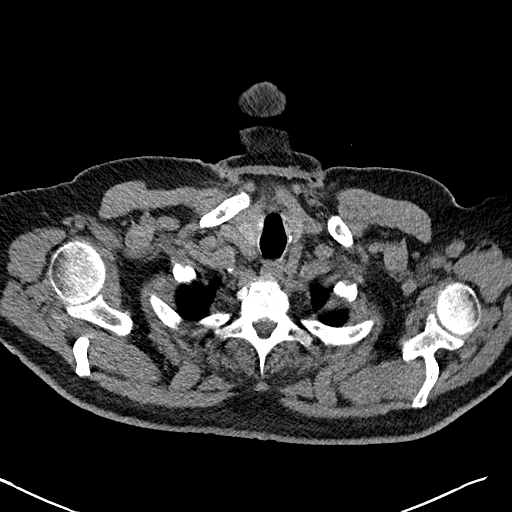

[Series 7: coronal · coronal · 0.66mm/px · 3 of 162 slices shown]
[im 33/162  lung]
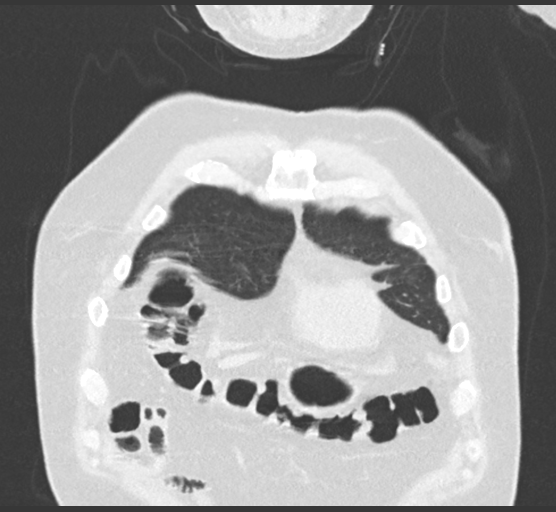
[im 65/162  lung]
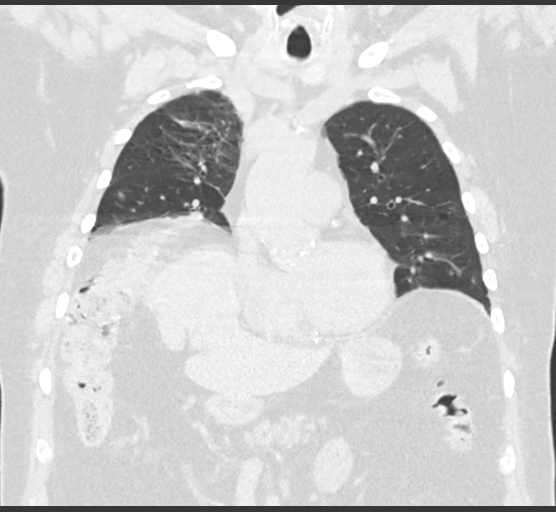
[im 97/162  lung]
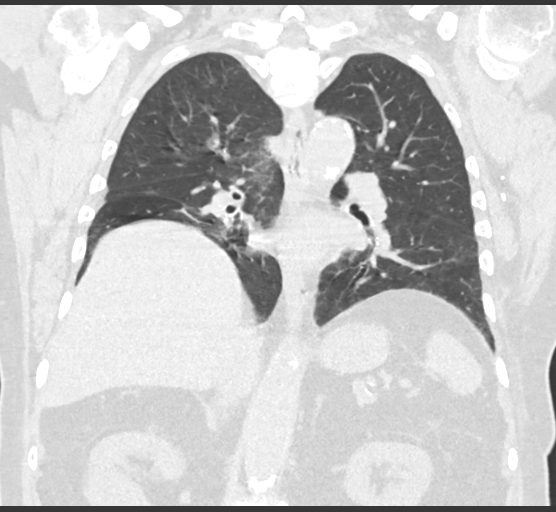

[Series 9: high res insp · axial · 0.77mm/px · z∈[+424,+756]mm · 3 of 24 slices shown, 4 images]
[im 1/24  mediastinal]
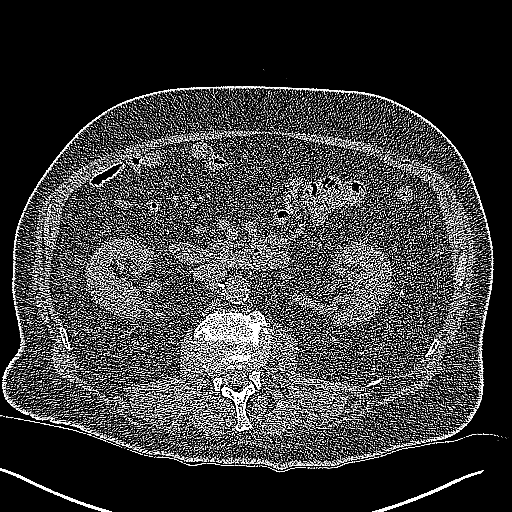
[im 1/24  lung]
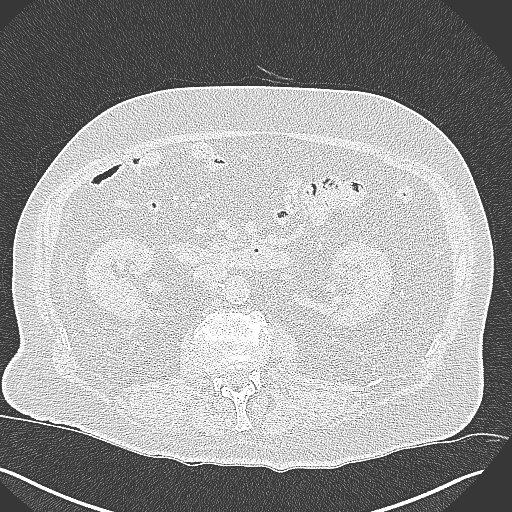
[im 12/24  lung]
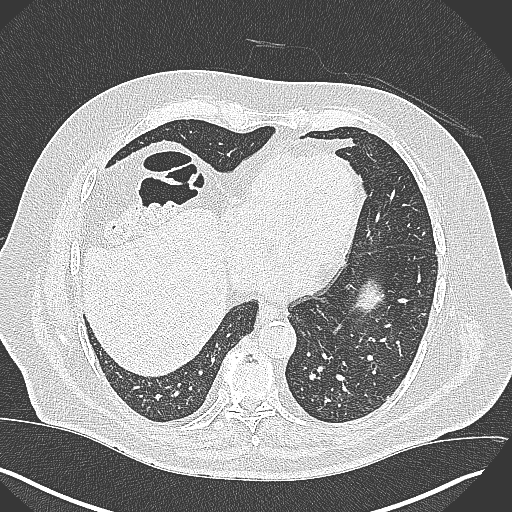
[im 24/24  lung]
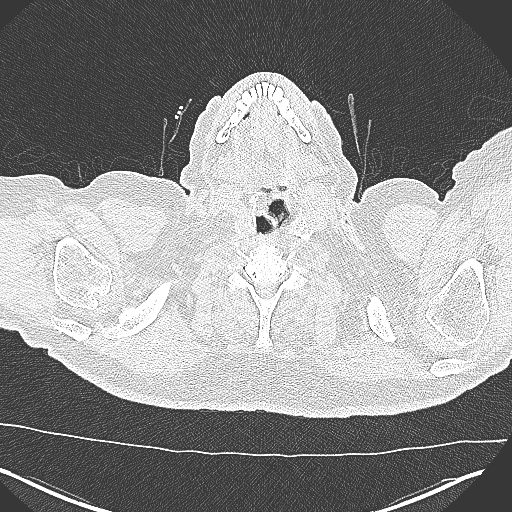

[14 of 36 positions shown; findings below may reference images not displayed]

FINDINGS: Cardiovascular: Aortic atherosclerosis. Aortic valve calcifications.
Normal heart size. Three-vessel coronary artery calcifications. No
pericardial effusion.

Mediastinum/Nodes: No enlarged mediastinal, hilar, or axillary lymph
nodes. Thyroid gland, trachea, and esophagus demonstrate no
significant findings.

Lungs/Pleura: Evaluation of pulmonary fibrosis is limited by breath
motion artifact, particularly at the lung bases. Within this
limitation, there is generally bland appearing, bandlike scarring
and volume loss involving the right lung base, with associated
elevation of the right hemidiaphragm, as well as additional bandlike
scarring of the anterior right upper lobe. Mild, lobular air
trapping on expiratory phase imaging. Occasional small bilateral
pulmonary nodules measuring 5 mm and smaller are stable and benign.
No pleural effusion or pneumothorax.

Upper Abdomen: No acute abnormality. Gallstone in the gallbladder.
Nonobstructive right nephrolithiasis.

Musculoskeletal: No chest wall mass or suspicious bone lesions
identified.
IMPRESSION: 1. Evaluation for pulmonary fibrosis is limited by breath motion
artifact, particularly at the lung bases. Within this limitation,
there is generally bland appearing, bandlike scarring and volume
loss involving the right lung base, with associated elevation of the
right hemidiaphragm, as well as additional bandlike scarring of the
anterior right upper lobe. Findings are unchanged compared to prior
examination and consistent with bland sequelae of prior infection or
inflammation. No evidence of fibrotic interstitial lung disease.
2. Occasional small bilateral pulmonary nodules measuring 5 mm and
smaller are stable and benign. No further routine CT follow-up is
required for these nodules.
3. Mild, lobular air trapping on expiratory phase imaging,
suggestive of small airways disease.
4. Coronary artery disease.
5. Aortic valve calcifications. Correlate for echocardiographic
evidence of aortic valve dysfunction.
6. Cholelithiasis.
7. Nonobstructive right nephrolithiasis.

Aortic Atherosclerosis ([id number]).

## 2022-09-22 NOTE — Telephone Encounter (Signed)
Pt called to get results of ct scan he did last week. Results are not available yet and I called to let pt know it was still pending.   [phone]

## 2022-09-24 NOTE — Telephone Encounter (Signed)
Exam still pending. Spoke with Victorino Dike at [facility] CT and she was going to call [facility] radiology. Pt called and made aware results are still pending and will we let him know results after the results are finalized and reviewed by doctor.

## 2022-09-25 NOTE — Telephone Encounter (Signed)
Pt called a couple of days ago for results and I let him know they were not back yet but they are in epic now for review. He states he is having a cramping pain on left side that is constant. Pain eases with certain positions when sitting or lying. No fever.

## 2022-09-29 NOTE — Telephone Encounter (Signed)
report faxed to PCP  

## 2022-09-29 NOTE — Telephone Encounter (Signed)
Discussed with patient per DR. Ahmed -  CT Abdomen showed No of the abdomen or pelvis to explain unintended weight loss. There is  Small left inguinal hernia containing a nonobstructed loop of  sigmoid colon. Nonobstructive right renal stone and enlarged prostate.   At this time CT abdomen doesn't show anything concerning which would explain weight loss , but has small hernia which may cause the pain . If pain worsens recommend going to the ED   Pt verbalized understanding and wanted copy to go to Dr. Sherril Croon. Ann please send copy. Thanks

## 2022-09-29 NOTE — Telephone Encounter (Signed)
Hi Wendy ,  Can you please call the patient and tell the CT Abdomen showed No of the abdomen or pelvis to explain unintended weight loss. There is  Small left inguinal hernia containing a nonobstructed loop of sigmoid colon. Nonobstructive right renal stone and enlarged prostate.  At this time CT abdomen doesn't show anything concerning which would explain weight loss , but has small hernia which may cause the pain . If pain worsens recommend going to the ED   Thanks,  Vista Lawman, MD Gastroenterology and Hepatology [facility]

## 2022-10-05 ENCOUNTER — Telehealth (INDEPENDENT_AMBULATORY_CARE_PROVIDER_SITE_OTHER): Payer: Self-pay | Admitting: *Deleted

## 2022-10-05 NOTE — Telephone Encounter (Signed)
Ann : can you refer this patient to Dr Lovell Sheehan for hernia assessement as per patient request   Toniann Fail : if you could let patient know that he does have a visit scheduled on 11/05 , but if he like we can see him earlier after surgery evaluation

## 2022-10-05 NOTE — Telephone Encounter (Signed)
Referral sent, they will contact patient with apt

## 2022-10-05 NOTE — Telephone Encounter (Signed)
Patient states pain is getting worse. Worse when moving and walking. Pain on left upper and lower when walking. Would like to get a referral to Dr. Lovell Sheehan for the hernia seen on ct scan.   Pt would like a call back to know if referral can be done or if he needs follow up visit. [phone].

## 2022-10-05 NOTE — Telephone Encounter (Signed)
Pt notified of all.

## 2022-10-15 DIAGNOSIS — Z0000 Encounter for general adult medical examination without abnormal findings: Secondary | ICD-10-CM | POA: Diagnosis not present

## 2022-10-15 DIAGNOSIS — K4090 Unilateral inguinal hernia, without obstruction or gangrene, not specified as recurrent: Secondary | ICD-10-CM | POA: Diagnosis not present

## 2022-10-15 DIAGNOSIS — I10 Essential (primary) hypertension: Secondary | ICD-10-CM | POA: Diagnosis not present

## 2022-10-15 DIAGNOSIS — G319 Degenerative disease of nervous system, unspecified: Secondary | ICD-10-CM | POA: Diagnosis not present

## 2022-10-15 DIAGNOSIS — K8020 Calculus of gallbladder without cholecystitis without obstruction: Secondary | ICD-10-CM | POA: Diagnosis not present

## 2022-10-15 DIAGNOSIS — Z299 Encounter for prophylactic measures, unspecified: Secondary | ICD-10-CM | POA: Diagnosis not present

## 2022-11-01 ENCOUNTER — Other Ambulatory Visit: Payer: Self-pay | Admitting: Cardiology

## 2022-11-03 ENCOUNTER — Encounter: Payer: Self-pay | Admitting: Surgery

## 2022-11-03 ENCOUNTER — Encounter: Payer: Self-pay | Admitting: *Deleted

## 2022-11-03 ENCOUNTER — Ambulatory Visit: Payer: Medicare Other | Admitting: Surgery

## 2022-11-03 VITALS — BP 134/65 | HR 58 | Temp 98.0°F | Resp 12 | Ht 68.0 in | Wt 183.0 lb

## 2022-11-03 DIAGNOSIS — I350 Nonrheumatic aortic (valve) stenosis: Secondary | ICD-10-CM

## 2022-11-03 DIAGNOSIS — K4090 Unilateral inguinal hernia, without obstruction or gangrene, not specified as recurrent: Secondary | ICD-10-CM

## 2022-11-05 NOTE — Progress Notes (Signed)
[facility] History and Physical  Reason for Referral: Left inguinal hernia Referring Physician: Dr. Tasia Catchings  Chief Complaint   New Patient (Initial Visit)     MARQUELL SAENZ is a 82 y.o. male.  HPI: Patient presents for evaluation of a left inguinal hernia.  He states that he first noted the area a few months ago.  He does note bulging in his left groin and he has pain when he is moving and when he tries to get comfortable in bed at night.  He does have some intermittent constipation which changes to diarrhea.  He denies any nausea and vomiting.  His last bowel movement was this morning.  This area will with laying down.  His past medical history significant for aortic stenosis, hypertension, and hyperlipidemia.  He takes an 81 mg aspirin every other day.  He does have a history of an open right inguinal hernia repair.  He drinks 4 ounces of bourbon daily.  Denies use of tobacco products and illicit drugs.  Past Medical History:  Diagnosis Date   Aortic stenosis    Childhood asthma    Coronary atherosclerosis    Mild at cardiac catheterization 2018   Essential hypertension    GERD (gastroesophageal reflux disease)    Hyperlipidemia     Past Surgical History:  Procedure Laterality Date   BIOPSY  07/27/2019   Procedure: BIOPSY;  Surgeon: Malissa Hippo, MD;  [facility];  Service: Endoscopy;;   COLONOSCOPY  10/01/2010   Procedure: COLONOSCOPY;  Surgeon: Malissa Hippo, MD;  [facility];  Service: Endoscopy;  Laterality: N/A;   COLONOSCOPY N/A 09/01/2013   Procedure: COLONOSCOPY;  Surgeon: Malissa Hippo, MD;  [facility];  Service: Endoscopy;  Laterality: N/A;  940   COLONOSCOPY N/A 07/27/2019   Procedure: COLONOSCOPY;  Surgeon: Malissa Hippo, MD;  [facility];  Service: Endoscopy;  Laterality: N/A;  1030   ESOPHAGOGASTRODUODENOSCOPY     ESOPHAGOGASTRODUODENOSCOPY N/A 04/11/2014   Procedure: ESOPHAGOGASTRODUODENOSCOPY  (EGD);  Surgeon: Malissa Hippo, MD;  [facility];  Service: Endoscopy;  Laterality: N/A;  1200 - moved to 12:55 - Ann to notify pt   ESOPHAGOGASTRODUODENOSCOPY N/A 07/27/2019   Procedure: ESOPHAGOGASTRODUODENOSCOPY (EGD);  Surgeon: Malissa Hippo, MD;  [facility];  Service: Endoscopy;  Laterality: N/A;   LEFT HEART CATH AND CORONARY ANGIOGRAPHY N/A 03/23/2016   Procedure: Left Heart Cath and Coronary Angiography;  Surgeon: Tonny Bollman, MD;  Location: [facility]VASIVE CV LAB;  Service: Cardiovascular;  Laterality: N/A;   POLYPECTOMY  07/27/2019   Procedure: POLYPECTOMY;  Surgeon: Malissa Hippo, MD;  [facility];  Service: Endoscopy;;   right leg  20 years ago   X 8 secondary to fracture   rt inguinal      hernia repair 2 months ago    Family History  Problem Relation Age of Onset   Cancer Mother    Heart attack Father     Social History   Tobacco Use   Smoking status: Never   Smokeless tobacco: Never  Vaping Use   Vaping status: Never Used  Substance Use Topics   Alcohol use: Yes    Alcohol/week: 5.0 standard drinks of alcohol    Types: 5 Shots of liquor per week    Comment: 2 oz in the afternoon   Drug use: No    Medications: I have reviewed the patient's current medications. Allergies as of  11/03/2022       Reactions   Other    [place] nuts - break out in hive throat swelling    Sulfa Antibiotics Rash   Vancomycin Rash        Medication List        Accurate as of November 03, 2022 11:59 PM. If you have any questions, ask your nurse or doctor.          albuterol 108 (90 Base) MCG/ACT inhaler Commonly known as: VENTOLIN HFA Inhale 2 puffs into the lungs every 6 (six) hours as needed for wheezing or shortness of breath.   aspirin EC 81 MG tablet Take 1 tablet (81 mg total) by mouth every other day. Swallow whole.   betamethasone valerate lotion 0.1 % Commonly known as: VALISONE Apply 1 application topically daily as  needed for irritation.   carvedilol 12.5 MG tablet Commonly known as: COREG TAKE 1 TABLET BY MOUTH TWICE DAILY   clotrimazole-betamethasone cream Commonly known as: LOTRISONE Apply 1 application topically 2 (two) times daily.   diclofenac 75 MG EC tablet Commonly known as: VOLTAREN Take 75 mg by mouth 2 (two) times daily.   Fish Oil 1200 MG Caps Take 1 capsule by mouth 2 (two) times daily before a meal.   fluticasone 0.05 % cream Commonly known as: CUTIVATE Apply 1 application topically daily as needed (irritation).   furosemide 20 MG tablet Commonly known as: LASIX TAKE 1 TABLET BY MOUTH EVERY DAY AS NEEDED FOR SWELLING   irbesartan 300 MG tablet Commonly known as: AVAPRO TAKE 1 TABLET BY MOUTH EVERY DAY   ketoconazole 2 % cream Commonly known as: NIZORAL Apply 1 application topically 2 (two) times daily as needed for irritation.   multivitamin tablet Take 1 tablet by mouth daily.   polyethylene glycol 17 g packet Commonly known as: MIRALAX / GLYCOLAX Take 17 g by mouth 2 (two) times daily.   PROBIOTIC PO Take 1 capsule by mouth daily.   psyllium 58.6 % packet Commonly known as: METAMUCIL Take 1 packet by mouth 2 (two) times daily.   rosuvastatin 20 MG tablet Commonly known as: CRESTOR Take 20 mg by mouth daily.         ROS:  Constitutional: negative for chills, fatigue, and fevers Eyes: negative for visual disturbance and pain Ears, nose, mouth, throat, and face: negative for ear drainage, sore throat, and sinus problems Respiratory: positive for shortness of breath, negative for cough and wheezing Cardiovascular: negative for chest pain and palpitations Gastrointestinal: positive for abdominal pain, negative for nausea, reflux symptoms, and vomiting Genitourinary:positive for frequency, negative for dysuria Integument/breast: negative for dryness and rash Hematologic/lymphatic: negative for bleeding and lymphadenopathy Musculoskeletal:positive for  neck pain, negative for back pain Neurological: negative for dizziness and tremors Endocrine: negative for temperature intolerance  Blood pressure 134/65, pulse (!) 58, temperature 98 F (36.7 C), temperature source Oral, resp. rate 12, height 5\' 8"  (1.727 m), weight 183 lb (83 kg), SpO2 94%. Physical Exam Vitals reviewed.  Constitutional:      Appearance: Normal appearance.  HENT:     Head: Normocephalic and atraumatic.  Eyes:     Extraocular Movements: Extraocular movements intact.     Pupils: Pupils are equal, round, and reactive to light.  Cardiovascular:     Rate and Rhythm: Normal rate and regular rhythm.     Heart sounds: Murmur heard.  Pulmonary:     Effort: Pulmonary effort is normal.     Breath sounds: Normal breath sounds.  Abdominal:     Comments: Abdomen soft, nondistended, no percussion tenderness, nontender to palpation; no rigidity, guarding, rebound tenderness; soft and reducible left inguinal hernia  Musculoskeletal:        General: Normal range of motion.     Cervical back: Normal range of motion.  Skin:    General: Skin is warm and dry.  Neurological:     General: No focal deficit present.     Mental Status: He is alert and oriented to person, place, and time.  Psychiatric:        Mood and Affect: Mood normal.        Behavior: Behavior normal.     Results: No results found for this or any previous visit (from the past 48 hour(s)).  No results found.   Assessment & Plan:  MARKIES MOWATT is a 82 y.o. male who presents for evaluation of a left inguinal hernia.  -I discussed the pathophysiology of inguinal hernias and we discussed the need for surgical repair -The risk and benefits of robotic assisted laparoscopic left inguinal hernia repair with mesh were discussed including but not limited to bleeding, infection, injury to surrounding structures, need for additional procedures, and hernia recurrence.  After careful consideration, Creig Landin has decided  to proceed with surgery.  -Prior to scheduling the patient for surgery, he will need cardiac clearance given his history of aortic stenosis -Patient's wife is also scheduled to go out of town to visit her daughter who was recently diagnosed with liver and pancreatic cancer on October 25.  Once patient receives cardiac clearance, will need to determine best timing for surgery -Information provided to the patient regarding inguinal hernias -Advised that the patient should present to the ED if they begin to have painful nonreducible bulge in his left groin, nausea, vomiting, and obstipation   All questions were answered to the satisfaction of the patient and family.   Theophilus Kinds, DO [facility] [address] [place], [place] [address] [phone] (office)

## 2022-11-11 ENCOUNTER — Telehealth: Payer: Self-pay

## 2022-11-11 ENCOUNTER — Telehealth (HOSPITAL_BASED_OUTPATIENT_CLINIC_OR_DEPARTMENT_OTHER): Payer: Self-pay | Admitting: *Deleted

## 2022-11-11 NOTE — Telephone Encounter (Signed)
   Pre-operative Risk Assessment    Patient Name: SANTE BIEDERMANN  DOB: [birth year] MRN: [id number]      Request for Surgical Clearance    Procedure:   XI Robotic Assisted Inguinal Hernia Repair W/Mesh, Left  Date of Surgery:  Clearance TBD                                 Surgeon:  Dr. Avanell Shackleton Pappayliou Surgeon's Group or Practice Name:  [facility] Phone number:  [phone] Fax number:  [phone]   Type of Clearance Requested:   - Medical  - Pharmacy:  Hold Aspirin Not Indicated   Type of Anesthesia:  General    Additional requests/questions:    Signed, Emmit Pomfret   11/11/2022, 1:32 PM

## 2022-11-11 NOTE — Telephone Encounter (Signed)
Spoke with patient who is agreeable to do a tele visit on 10/31 at 9:40 am. Med rec and consent done.

## 2022-11-11 NOTE — Telephone Encounter (Signed)
   Name: CAMRYN QUESINBERRY  DOB: October 23, 1940  MRN: [id number]  Primary Cardiologist: Nona Dell, MD  Chart reviewed as part of pre-operative protocol coverage. Because of Asberry Lascola Borgen's past medical history and time since last visit, he will require a follow-up telephone visit in order to better assess preoperative cardiovascular risk.  Pre-op covering staff: - Please schedule appointment and call patient to inform them. If patient already had an upcoming appointment within acceptable timeframe, please add "pre-op clearance" to the appointment notes so provider is aware. - Please contact requesting surgeon's office via preferred method (i.e, phone, fax) to inform them of need for appointment prior to surgery.  Patient can hold aspirin x 5 to 7 days prior to procedure and resume when medically safe to do so as long as he is not symptomatic at the time of phone call.  Sharlene Dory, PA-C  11/11/2022, 3:36 PM

## 2022-11-11 NOTE — Telephone Encounter (Signed)
  Patient Consent for Virtual Visit        Shane Osborne has provided verbal consent on 11/11/2022 for a virtual visit (video or telephone).   CONSENT FOR VIRTUAL VISIT FOR:  Shane Osborne  By participating in this virtual visit I agree to the following:  I hereby voluntarily request, consent and authorize [facility] and its employed or contracted physicians, physician assistants, nurse practitioners or other licensed health care professionals (the Practitioner), to provide me with telemedicine health care services (the "Services") as deemed necessary by the treating Practitioner. I acknowledge and consent to receive the Services by the Practitioner via telemedicine. I understand that the telemedicine visit will involve communicating with the Practitioner through live audiovisual communication technology and the disclosure of certain medical information by electronic transmission. I acknowledge that I have been given the opportunity to request an in-person assessment or other available alternative prior to the telemedicine visit and am voluntarily participating in the telemedicine visit.  I understand that I have the right to withhold or withdraw my consent to the use of telemedicine in the course of my care at any time, without affecting my right to future care or treatment, and that the Practitioner or I may terminate the telemedicine visit at any time. I understand that I have the right to inspect all information obtained and/or recorded in the course of the telemedicine visit and may receive copies of available information for a reasonable fee.  I understand that some of the potential risks of receiving the Services via telemedicine include:  Delay or interruption in medical evaluation due to technological equipment failure or disruption; Information transmitted may not be sufficient (e.g. poor resolution of images) to allow for appropriate medical decision making by the Practitioner;  and/or  In rare instances, security protocols could fail, causing a breach of personal health information.  Furthermore, I acknowledge that it is my responsibility to provide information about my medical history, conditions and care that is complete and accurate to the best of my ability. I acknowledge that Practitioner's advice, recommendations, and/or decision may be based on factors not within their control, such as incomplete or inaccurate data provided by me or distortions of diagnostic images or specimens that may result from electronic transmissions. I understand that the practice of medicine is not an exact science and that Practitioner makes no warranties or guarantees regarding treatment outcomes. I acknowledge that a copy of this consent can be made available to me via my patient portal [facility] MyChart), or I can request a printed copy by calling the office of [facility].    I understand that my insurance will be billed for this visit.   I have read or had this consent read to me. I understand the contents of this consent, which adequately explains the benefits and risks of the Services being provided via telemedicine.  I have been provided ample opportunity to ask questions regarding this consent and the Services and have had my questions answered to my satisfaction. I give my informed consent for the services to be provided through the use of telemedicine in my medical care

## 2022-11-26 ENCOUNTER — Ambulatory Visit: Payer: Medicare Other | Attending: Cardiology | Admitting: Student

## 2022-11-26 DIAGNOSIS — Z01810 Encounter for preprocedural cardiovascular examination: Secondary | ICD-10-CM | POA: Diagnosis not present

## 2022-11-26 NOTE — Progress Notes (Signed)
Virtual Visit via Telephone Note   Because of Shane Osborne's co-morbid illnesses, he is at least at moderate risk for complications without adequate follow up.  This format is felt to be most appropriate for this patient at this time.  The patient did not have access to video technology/had technical difficulties with video requiring transitioning to audio format only (telephone).  All issues noted in this document were discussed and addressed.  No physical exam could be performed with this format.  Please refer to the patient's chart for his consent to telehealth for [facility].  Evaluation Performed:  Preoperative cardiovascular risk assessment _____________   Date:  11/26/2022   Patient ID:  Shane Osborne, Shane Osborne 01/17/1941, MRN [id number] Patient Location:  Home Provider location:   Office  Primary Care Provider:  Ignatius Specking, MD Primary Cardiologist:  Nona Dell, MD  Chief Complaint / Patient Profile   82 y.o. y/o male with a h/o nonobstructive CAD per angiography, aortic stenosis, hypertension, hyperlipidemia, pulmonary fibrosis who is pending XI robotic assisted inguinal hernia repair with mesh, left by Dr. Wyatt Mage and presents today for telephonic preoperative cardiovascular risk assessment.  History of Present Illness    Shane Osborne is a 82 y.o. male who presents via audio/video conferencing for a telehealth visit today.  Pt was last seen in cardiology clinic on 07/29/2022 by Dr. Diona Browner.  At that time SEVRIN GRAHN was stable from a cardiac standpoint.  The patient is now pending procedure as outlined above. Since his last visit, he is doing well. Patient denies lower extremity edema, orthopnea or PND. He reports chronic dyspnea that has been on ongoing issue for him and is unchanged from previous. No chest pain, pressure, or tightness. No palpitations. He stays active by walking for 15-20 minutes daily.    Past Medical History    Past Medical History:   Diagnosis Date   Aortic stenosis    Childhood asthma    Coronary atherosclerosis    Mild at cardiac catheterization 2018   Essential hypertension    GERD (gastroesophageal reflux disease)    Hyperlipidemia    Past Surgical History:  Procedure Laterality Date   BIOPSY  07/27/2019   Procedure: BIOPSY;  Surgeon: Malissa Hippo, MD;  [facility];  Service: Endoscopy;;   COLONOSCOPY  10/01/2010   Procedure: COLONOSCOPY;  Surgeon: Malissa Hippo, MD;  [facility];  Service: Endoscopy;  Laterality: N/A;   COLONOSCOPY N/A 09/01/2013   Procedure: COLONOSCOPY;  Surgeon: Malissa Hippo, MD;  [facility];  Service: Endoscopy;  Laterality: N/A;  940   COLONOSCOPY N/A 07/27/2019   Procedure: COLONOSCOPY;  Surgeon: Malissa Hippo, MD;  [facility];  Service: Endoscopy;  Laterality: N/A;  1030   ESOPHAGOGASTRODUODENOSCOPY     ESOPHAGOGASTRODUODENOSCOPY N/A 04/11/2014   Procedure: ESOPHAGOGASTRODUODENOSCOPY (EGD);  Surgeon: Malissa Hippo, MD;  [facility];  Service: Endoscopy;  Laterality: N/A;  1200 - moved to 12:55 - Ann to notify pt   ESOPHAGOGASTRODUODENOSCOPY N/A 07/27/2019   Procedure: ESOPHAGOGASTRODUODENOSCOPY (EGD);  Surgeon: Malissa Hippo, MD;  [facility];  Service: Endoscopy;  Laterality: N/A;   LEFT HEART CATH AND CORONARY ANGIOGRAPHY N/A 03/23/2016   Procedure: Left Heart Cath and Coronary Angiography;  Surgeon: Tonny Bollman, MD;  Location: [facility] INVASIVE CV LAB;  Service: Cardiovascular;  Laterality: N/A;   POLYPECTOMY  07/27/2019   Procedure: POLYPECTOMY;  Surgeon: Malissa Hippo,  MD;  [facility];  Service: Endoscopy;;   right leg  20 years ago   X 8 secondary to fracture   rt inguinal      hernia repair 2 months ago    Allergies  Allergies  Allergen Reactions   Other     [place] nuts - break out in hive throat swelling    Sulfa Antibiotics Rash   Vancomycin Rash    Home Medications     Prior to Admission medications   Medication Sig Start Date End Date Taking? Authorizing Provider  albuterol (VENTOLIN HFA) 108 (90 Base) MCG/ACT inhaler Inhale 2 puffs into the lungs every 6 (six) hours as needed for wheezing or shortness of breath. 11/21/19   Oretha Milch, MD  aspirin EC 81 MG tablet Take 1 tablet (81 mg total) by mouth every other day. Swallow whole. 07/29/22   Jonelle Sidle, MD  betamethasone valerate lotion (VALISONE) 0.1 % Apply 1 application topically daily as needed for irritation. 02/27/19   [provider]  carvedilol (COREG) 12.5 MG tablet TAKE 1 TABLET BY MOUTH TWICE DAILY 11/02/22   Jonelle Sidle, MD  clotrimazole-betamethasone (LOTRISONE) cream Apply 1 application topically 2 (two) times daily. 08/07/20   McKenzie, Mardene Celeste, MD  diclofenac (VOLTAREN) 75 MG EC tablet Take 75 mg by mouth 2 (two) times daily.    [provider]  fluticasone (CUTIVATE) 0.05 % cream Apply 1 application topically daily as needed (irritation). 07/03/19   [provider]  furosemide (LASIX) 20 MG tablet TAKE 1 TABLET BY MOUTH EVERY DAY AS NEEDED FOR SWELLING 11/20/21   Jonelle Sidle, MD  irbesartan (AVAPRO) 300 MG tablet TAKE 1 TABLET BY MOUTH EVERY DAY 01/12/20   Netta Neat., NP  ketoconazole (NIZORAL) 2 % cream Apply 1 application topically 2 (two) times daily as needed for irritation. 07/03/19   [provider]  Multiple Vitamin (MULTIVITAMIN) tablet Take 1 tablet by mouth daily.    [provider]  Omega-3 Fatty Acids (FISH OIL) 1200 MG CAPS Take 1 capsule by mouth 2 (two) times daily before a meal.     [provider]  polyethylene glycol (MIRALAX / GLYCOLAX) 17 g packet Take 17 g by mouth 2 (two) times daily. 09/02/22 12/01/22  Franky Macho, MD  Probiotic Product (PROBIOTIC PO) Take 1 capsule by mouth daily.     [provider]  psyllium (METAMUCIL) 58.6 % packet Take 1 packet by mouth 2 (two) times daily.  09/02/22 12/01/22  Franky Macho, MD  rosuvastatin (CRESTOR) 20 MG tablet Take 20 mg by mouth daily.    [provider]    Physical Exam    Vital Signs:  NIKIA FRANCES does not have vital signs available for review today.  Given telephonic nature of communication, physical exam is limited. AAOx3. NAD. Normal affect.  Speech and respirations are unlabored.  Accessory Clinical Findings    None  Assessment & Plan    Primary Cardiologist: Nona Dell, MD  Preoperative cardiovascular risk assessment. XI robotic assisted inguinal hernia repair with mesh, left by Dr. Robyne Peers.  Chart reviewed as part of pre-operative protocol coverage. According to the RCRI, patient has a 0.4% risk of MACE. Patient reports activity equivalent to >4.0 METS (daily walks for 15-20 minutes).   Given past medical history and time since last visit, based on ACC/AHA guidelines, AADITH PRIDMORE would be at acceptable risk for the planned procedure without further cardiovascular  testing.   Patient was advised that if he develops new symptoms prior to surgery to contact our office to arrange a follow-up appointment.  he verbalized understanding.  Ideally aspirin should be continued without interruption, however if the bleeding risk is too great, aspirin may be held for 5-7 days prior to surgery. Please resume aspirin post operatively when it is felt to be safe from a bleeding standpoint.    I will route this recommendation to the requesting party via Epic fax function.  Please call with questions.  Time:   Today, I have spent 5 minutes with the patient with telehealth technology discussing medical history, symptoms, and management plan.     Carlos Levering, NP  11/26/2022, 8:00 AM

## 2022-11-26 NOTE — Addendum Note (Signed)
Addended by: Phillips Odor on: 11/26/2022 12:19 PM   Modules accepted: Orders

## 2022-12-01 ENCOUNTER — Ambulatory Visit (INDEPENDENT_AMBULATORY_CARE_PROVIDER_SITE_OTHER): Payer: Medicare Other | Admitting: Gastroenterology

## 2022-12-01 ENCOUNTER — Encounter (INDEPENDENT_AMBULATORY_CARE_PROVIDER_SITE_OTHER): Payer: Self-pay | Admitting: Gastroenterology

## 2022-12-01 VITALS — BP 156/72 | HR 49 | Temp 97.5°F | Ht 68.0 in | Wt 183.0 lb

## 2022-12-01 DIAGNOSIS — R634 Abnormal weight loss: Secondary | ICD-10-CM

## 2022-12-01 DIAGNOSIS — R109 Unspecified abdominal pain: Secondary | ICD-10-CM | POA: Diagnosis not present

## 2022-12-01 DIAGNOSIS — K279 Peptic ulcer, site unspecified, unspecified as acute or chronic, without hemorrhage or perforation: Secondary | ICD-10-CM

## 2022-12-01 DIAGNOSIS — K4030 Unilateral inguinal hernia, with obstruction, without gangrene, not specified as recurrent: Secondary | ICD-10-CM | POA: Insufficient documentation

## 2022-12-01 DIAGNOSIS — K641 Second degree hemorrhoids: Secondary | ICD-10-CM

## 2022-12-01 DIAGNOSIS — K5904 Chronic idiopathic constipation: Secondary | ICD-10-CM

## 2022-12-01 DIAGNOSIS — K4090 Unilateral inguinal hernia, without obstruction or gangrene, not specified as recurrent: Secondary | ICD-10-CM | POA: Diagnosis not present

## 2022-12-01 DIAGNOSIS — K5900 Constipation, unspecified: Secondary | ICD-10-CM

## 2022-12-01 NOTE — Patient Instructions (Signed)
Ensure adequate fluid intake: Aim for 8 glasses of water daily. Follow a high fiber diet: Include foods such as dates, prunes, pears, and kiwi. Take Miralax twice a day for the first week, then reduce to once daily thereafter. Use Metamucil once a day.

## 2022-12-01 NOTE — Progress Notes (Signed)
Shane Osborne , M.D. Gastroenterology & Hepatology [facility] [address] [place], [place] [address] Primary Care Physician: Ignatius Specking, MD [address] [place] [address]  Chief Complaint:  Hematochezia,weight loss, abdominal discomfort , PUD   History of Present Illness: Shane Osborne is a 82 y.o. male with CAD , PUD, HTN , GERD , HLD AS  who presents for evaluation of Painless hematochezia which has resolved ,and abdominal discomfort deemed due to constipation in setting of inguinal hernia   and PUD .  \Today patient reports she is having 1-2 bowel movement Bristol stool 4 every day without straining since he has been taking Metamucil twice a day and MiraLAX once a day.  After having regular bowel movements his abdominal pain has resolved.  Patient is awaiting surgical repair of the.  He has not noticed any fresh blood on toilet paper which he has previously seen in setting of hard stool and straining He said he tries to eat healthy and walks daily but has lost 40 pounds in past 1.  Stepdaughter was diagnosed with pancreatic cancer and patient is concerned.  He has never been a smoker.  Patient is taking diclofenac acid for headaches.The patient denies having any nausea, vomiting, fever, chills, melena, hematemesis,  jaundice, pruritus.  Last WGN:5621  The examined esophagus was normal.  The Z- line was regular and was found 39 cm from the incisors.  One non- bleeding superficial gastric ulcer with pigmented material was found on the lesser curvature of the gastric antrum. The lesion was three mm by five mm in largest dimension. Biopsies were taken with a cold forceps for histology. The pathology specimen was placed into Bottle Number 1.  Patchy mild inflammation characterized by congestion ( edema) , erosions and erythema was found in the gastric antrum and in the prepyloric region of the stomach. The duodenal bulb and second portion  of the duodenum were normal.  Last Colonoscopy:2021  - Diverticulosis in the sigmoid colon and at the hepatic flexure. - One small polyp at the hepatic flexure, removed with a cold snare. Resected and retrieved. - Medium- sized lipoma in the descending colon. - External and internal hemorrhoids.  A. ANTRUM, BIOPSY:  - Gastric antral and oxyntic mucosa with nonspecific reactive  gastropathy  - Warthin Starry stain is negative for Helicobacter pylori   B. COLON, HEPATIC FLEXURE, POLYPECTOMY:  - Tubular adenoma(s)  - Negative for high-grade dysplasia or malignancy   FHx: neg for any gastrointestinal/liver disease, no malignancies Social: neg smoking, alcohol or illicit drug use Surgical: no abdominal surgeries  Past Medical History: Past Medical History:  Diagnosis Date   Aortic stenosis    Childhood asthma    Coronary atherosclerosis    Mild at cardiac catheterization 2018   Essential hypertension    GERD (gastroesophageal reflux disease)    Hyperlipidemia     Past Surgical History: Past Surgical History:  Procedure Laterality Date   BIOPSY  07/27/2019   Procedure: BIOPSY;  Surgeon: Malissa Hippo, MD;  [facility];  Service: Endoscopy;;   COLONOSCOPY  10/01/2010   Procedure: COLONOSCOPY;  Surgeon: Malissa Hippo, MD;  [facility];  Service: Endoscopy;  Laterality: N/A;   COLONOSCOPY N/A 09/01/2013   Procedure: COLONOSCOPY;  Surgeon: Malissa Hippo, MD;  [facility];  Service: Endoscopy;  Laterality: N/A;  940   COLONOSCOPY N/A 07/27/2019   Procedure: COLONOSCOPY;  Surgeon: Malissa Hippo,  MD;  [facility];  Service: Endoscopy;  Laterality: N/A;  1030   ESOPHAGOGASTRODUODENOSCOPY     ESOPHAGOGASTRODUODENOSCOPY N/A 04/11/2014   Procedure: ESOPHAGOGASTRODUODENOSCOPY (EGD);  Surgeon: Malissa Hippo, MD;  [facility];  Service: Endoscopy;  Laterality: N/A;  1200 - moved to 12:55 - Ann to notify pt    ESOPHAGOGASTRODUODENOSCOPY N/A 07/27/2019   Procedure: ESOPHAGOGASTRODUODENOSCOPY (EGD);  Surgeon: Malissa Hippo, MD;  [facility];  Service: Endoscopy;  Laterality: N/A;   LEFT HEART CATH AND CORONARY ANGIOGRAPHY N/A 03/23/2016   Procedure: Left Heart Cath and Coronary Angiography;  Surgeon: Tonny Bollman, MD;  [facility]: [facility] INVASIVE CV LAB;  Service: Cardiovascular;  Laterality: N/A;   POLYPECTOMY  07/27/2019   Procedure: POLYPECTOMY;  Surgeon: Malissa Hippo, MD;  [facility];  Service: Endoscopy;;   right leg  20 years ago   X 8 secondary to fracture   rt inguinal      hernia repair 2 months ago    Family History: Family History  Problem Relation Age of Onset   Cancer Mother    Heart attack Father     Social History: Social History   Tobacco Use  Smoking Status Never  Smokeless Tobacco Never   Social History   Substance and Sexual Activity  Alcohol Use Yes   Alcohol/week: 5.0 standard drinks of alcohol   Types: 5 Shots of liquor per week   Comment: 2 oz in the afternoon   Social History   Substance and Sexual Activity  Drug Use No    Allergies: Allergies  Allergen Reactions   Other     [place] nuts - break out in hive throat swelling    Sulfa Antibiotics Rash   Vancomycin Rash    Medications: Current Outpatient Medications  Medication Sig Dispense Refill   albuterol (VENTOLIN HFA) 108 (90 Base) MCG/ACT inhaler Inhale 2 puffs into the lungs every 6 (six) hours as needed for wheezing or shortness of breath. 8 g 6   aspirin EC 81 MG tablet Take 1 tablet (81 mg total) by mouth every other day. Swallow whole. 15 tablet 3   betamethasone valerate lotion (VALISONE) 0.1 % Apply 1 application topically daily as needed for irritation.     carvedilol (COREG) 12.5 MG tablet TAKE 1 TABLET BY MOUTH TWICE DAILY 180 tablet 1   clotrimazole-betamethasone (LOTRISONE) cream Apply 1 application topically 2 (two) times daily. 30 g 11   diclofenac  (VOLTAREN) 75 MG EC tablet Take 75 mg by mouth 2 (two) times daily.     fluticasone (CUTIVATE) 0.05 % cream Apply 1 application topically daily as needed (irritation).     furosemide (LASIX) 20 MG tablet TAKE 1 TABLET BY MOUTH EVERY DAY AS NEEDED FOR SWELLING 90 tablet 0   irbesartan (AVAPRO) 300 MG tablet TAKE 1 TABLET BY MOUTH EVERY DAY 30 tablet 6   ketoconazole (NIZORAL) 2 % cream Apply 1 application topically 2 (two) times daily as needed for irritation.     Multiple Vitamin (MULTIVITAMIN) tablet Take 1 tablet by mouth daily.     Omega-3 Fatty Acids (FISH OIL) 1200 MG CAPS Take 1 capsule by mouth 2 (two) times daily before a meal.      polyethylene glycol (MIRALAX / GLYCOLAX) 17 g packet Take 17 g by mouth 2 (two) times daily. 60 packet 2   Probiotic Product (PROBIOTIC PO) Take 1 capsule by mouth daily.      psyllium (METAMUCIL) 58.6 % packet  Take 1 packet by mouth 2 (two) times daily. 60 packet 2   rosuvastatin (CRESTOR) 20 MG tablet Take 20 mg by mouth daily.     No current facility-administered medications for this visit.    Review of Systems: GENERAL: negative for malaise, night sweats HEENT: No changes in hearing or vision, no nose bleeds or other nasal problems. NECK: Negative for lumps, goiter, pain and significant neck swelling RESPIRATORY: Negative for cough, wheezing CARDIOVASCULAR: Negative for chest pain, leg swelling, palpitations, orthopnea GI: SEE HPI MUSCULOSKELETAL: Negative for joint pain or swelling, back pain, and muscle pain. SKIN: Negative for lesions, rash HEMATOLOGY Negative for prolonged bleeding, bruising easily, and swollen nodes. ENDOCRINE: Negative for cold or heat intolerance, polyuria, polydipsia and goiter. NEURO: negative for tremor, gait imbalance, syncope and seizures. The remainder of the review of systems is noncontributory.   Physical Exam: BP (!) 156/72   Pulse (!) 49   Temp (!) 97.5 F (36.4 C) (Oral)   Ht 5\' 8"  (1.727 m)   Wt 183 lb  (83 kg)   BMI 27.83 kg/m  GENERAL: The patient is AO x3, in no acute distress. HEENT: Head is normocephalic and atraumatic. EOMI are intact. Mouth is well hydrated and without lesions. NECK: Supple. No masses LUNGS: Clear to auscultation. No presence of rhonchi/wheezing/rales. Adequate chest expansion HEART: RRR, normal s1 and s2. ABDOMEN: Soft, nontender, no guarding, no peritoneal signs, and nondistended. BS +. No masses. EXTREMITIES: Without any cyanosis, clubbing, rash, lesions or edema. NEUROLOGIC: AOx3, no focal motor deficit. SKIN: no jaundice, no rashes   Imaging/Labs: as above  I personally reviewed and interpreted the available labs, imaging and endoscopic files.  Impression and Plan: Shane Osborne is a 82 y.o. male with CAD , PUD, HTN , GERD , HLD AS  who presents for evaluation of Painless hematochezia which has resolved ,and abdominal discomfort deemed due to constipation in setting of inguinal hernia   and PUD .  #Constipation- well controlled   Patient having Bristol stool 4 1-2 bowel movement daily without any further straining  Recs: Ensure adequate fluid intake: Aim for 8 glasses of water daily. Follow a high fiber diet: Include foods such as dates, prunes, pears, and kiwi. Take Miralax twice a day for the first week, then reduce to once daily thereafter. Use Metamucil twice a day.  #Painless hematochezia -resolved   Patient is up-to-date to his colonoscopy last performed in 2021, could prep with 1 diminutive polyp recommended colonoscopy would be in 7 years but given patient advanced age decision to pursue colonoscopy is individualized  As patient is up to due to colonoscopy and I have not noticed any fresh blood upon wiping since his constipation is well-controlled this was likely due to internal/external hemorrhoids seen last colonoscopy   # Abdominal pain and discomfort #Weight loss  Abdominal pain is better controlled after constipation is relieved and  is likely due to inguinal hernia containing sigmoid colon for which he is undergoing surgery. He reports of losing 40 pounds in past 1 year although is trying to eat healthy and walking every day.  Patient was concerned about weight loss because stepdaughter was diagnosed with pancreatic cancer but CT did not show any malignancy . CT abdomen pelvis with IV contrast without any mass which showed left inguinal hernia  Patient advised to keep an eye on weight if Continues to have significant weight loss if he is not trying to lose weight may pursue early EGD and colonoscopy  #Peptic  ulcer disease  Last upper endoscopy 2021 with a very small superficial ulcer biopsy negative for H. Pylori.  Patient was taking diclofenac for headaches  Advised to avoid NSAIDs given history of peptic ulcer disease and take Tylenol rather If patient continues to have abdominal symptoms we will consider upper endoscopy to assess healing of the ulcer.  Although given the ulcer was very small repeat endoscopy to assess healing of ulcer is not necessarily recommended in these cases  All questions were answered.      Shane Lawman, MD Gastroenterology and Hepatology [facility] Gastroenterology   This chart has been completed using [facility] Dictation software, and while attempts have been made to ensure accuracy , certain words and phrases may not be transcribed as intended

## 2022-12-14 ENCOUNTER — Telehealth: Payer: Self-pay | Admitting: Cardiology

## 2022-12-14 NOTE — Telephone Encounter (Signed)
Spoke with patient in regards to his BP.  States he has been concerned that his readings seemed to be higher in the afternoon or before bed.  States he is scheduled for hernia surgery on 12/21/22 & has his pre-op visit this Thursday.  No other c/o chest pain or dizziness.  Does have some SOB but knows that is pulmonary related and not new for him.    BP this morning - 116/59  53 10:00 am - 129/64  66  11/17 - 123/858  58             132/77  72   Last week had readings of 188/98 & 156/75.  States he has been checking his BP before morning medications & varying times during the day.    Asked him to continue to log his BP & HR readings, but to check 2 hours after his morning meds & to sit still 5-10 minutes.    Medication list reviewed, no changes.

## 2022-12-14 NOTE — Telephone Encounter (Signed)
Calling to speak to the nurse about his bp issues before he has surgery. Please advise

## 2022-12-17 ENCOUNTER — Encounter (HOSPITAL_COMMUNITY)
Admission: RE | Admit: 2022-12-17 | Discharge: 2022-12-17 | Disposition: A | Discharge status: Home / Self Care | Payer: Medicare Other | Source: Ambulatory Visit | Attending: Surgery | Admitting: Surgery

## 2022-12-17 ENCOUNTER — Other Ambulatory Visit: Payer: Self-pay

## 2022-12-17 ENCOUNTER — Encounter (HOSPITAL_COMMUNITY): Payer: Self-pay

## 2022-12-17 HISTORY — DX: Transient cerebral ischemic attack, unspecified: G45.9

## 2022-12-18 DIAGNOSIS — I10 Essential (primary) hypertension: Secondary | ICD-10-CM | POA: Diagnosis not present

## 2022-12-18 DIAGNOSIS — Z299 Encounter for prophylactic measures, unspecified: Secondary | ICD-10-CM | POA: Diagnosis not present

## 2022-12-18 DIAGNOSIS — J029 Acute pharyngitis, unspecified: Secondary | ICD-10-CM | POA: Diagnosis not present

## 2022-12-21 ENCOUNTER — Other Ambulatory Visit: Payer: Self-pay

## 2022-12-21 ENCOUNTER — Ambulatory Visit (HOSPITAL_COMMUNITY): Payer: Medicare Other | Admitting: Certified Registered Nurse Anesthetist

## 2022-12-21 ENCOUNTER — Encounter (HOSPITAL_COMMUNITY): Payer: Self-pay | Admitting: Surgery

## 2022-12-21 ENCOUNTER — Telehealth: Payer: Self-pay | Admitting: *Deleted

## 2022-12-21 ENCOUNTER — Encounter (HOSPITAL_COMMUNITY)
Admission: RE | Disposition: A | Discharge status: Home / Self Care | Payer: Self-pay | Source: Home / Self Care | Attending: Surgery

## 2022-12-21 ENCOUNTER — Ambulatory Visit (HOSPITAL_COMMUNITY)
Admission: RE | Admit: 2022-12-21 | Discharge: 2022-12-21 | Disposition: A | Discharge status: Home / Self Care | Payer: Medicare Other | Attending: Surgery | Admitting: Surgery

## 2022-12-21 DIAGNOSIS — Z8711 Personal history of peptic ulcer disease: Secondary | ICD-10-CM | POA: Insufficient documentation

## 2022-12-21 DIAGNOSIS — I2510 Atherosclerotic heart disease of native coronary artery without angina pectoris: Secondary | ICD-10-CM | POA: Diagnosis not present

## 2022-12-21 DIAGNOSIS — K4030 Unilateral inguinal hernia, with obstruction, without gangrene, not specified as recurrent: Secondary | ICD-10-CM

## 2022-12-21 DIAGNOSIS — I350 Nonrheumatic aortic (valve) stenosis: Secondary | ICD-10-CM | POA: Insufficient documentation

## 2022-12-21 DIAGNOSIS — I10 Essential (primary) hypertension: Secondary | ICD-10-CM | POA: Insufficient documentation

## 2022-12-21 DIAGNOSIS — Z7982 Long term (current) use of aspirin: Secondary | ICD-10-CM | POA: Insufficient documentation

## 2022-12-21 HISTORY — PX: XI ROBOTIC ASSISTED INGUINAL HERNIA REPAIR WITH MESH: SHX6706

## 2022-12-21 SURGERY — REPAIR, HERNIA, INGUINAL, ROBOT-ASSISTED, LAPAROSCOPIC, USING MESH
Anesthesia: General | Site: Abdomen | Laterality: Left

## 2022-12-21 MED ORDER — PHENYLEPHRINE 80 MCG/ML (10ML) SYRINGE FOR IV PUSH (FOR BLOOD PRESSURE SUPPORT)
PREFILLED_SYRINGE | INTRAVENOUS | Status: AC
Start: 2022-12-21 — End: ?
  Filled 2022-12-21: qty 10

## 2022-12-21 MED ORDER — OXYCODONE HCL 5 MG PO TABS: 5.0000 mg | ORAL_TABLET | Freq: Once | ORAL | Status: DC | PRN

## 2022-12-21 MED ORDER — CHLORHEXIDINE GLUCONATE CLOTH 2 % EX PADS
6.0000 | MEDICATED_PAD | Freq: Once | CUTANEOUS | Status: DC
  Administered 2022-12-21: 6 via TOPICAL

## 2022-12-21 MED ORDER — CHLORHEXIDINE GLUCONATE 0.12 % MT SOLN
15.0000 mL | Freq: Once | OROMUCOSAL | Status: AC
  Administered 2022-12-21: 15 mL via OROMUCOSAL

## 2022-12-21 MED ORDER — ACETAMINOPHEN 10 MG/ML IV SOLN
INTRAVENOUS | Status: DC | PRN
  Administered 2022-12-21: 1000 mg via INTRAVENOUS

## 2022-12-21 MED ORDER — CHLORHEXIDINE GLUCONATE CLOTH 2 % EX PADS
6.0000 | MEDICATED_PAD | Freq: Once | CUTANEOUS | Status: DC
Start: 2022-12-21 — End: 2022-12-21
  Administered 2022-12-21: 6 via TOPICAL

## 2022-12-21 MED ORDER — ROCURONIUM BROMIDE 10 MG/ML (PF) SYRINGE
PREFILLED_SYRINGE | INTRAVENOUS | Status: DC | PRN
  Administered 2022-12-21: 50 mg via INTRAVENOUS
  Administered 2022-12-21 (×4): 10 mg via INTRAVENOUS

## 2022-12-21 MED ORDER — ACETAMINOPHEN 500 MG PO TABS: 1000.0000 mg | ORAL_TABLET | Freq: Four times a day (QID) | ORAL | 0 refills | Status: AC

## 2022-12-21 MED ORDER — STERILE WATER FOR IRRIGATION IR SOLN
Status: DC | PRN
  Administered 2022-12-21: 1000 mL

## 2022-12-21 MED ORDER — PROPOFOL 10 MG/ML IV BOLUS
INTRAVENOUS | Status: DC | PRN
  Administered 2022-12-21: 10 mg via INTRAVENOUS
  Administered 2022-12-21: 20 mg via INTRAVENOUS
  Administered 2022-12-21: 100 mg via INTRAVENOUS

## 2022-12-21 MED ORDER — ONDANSETRON HCL 4 MG/2ML IJ SOLN
INTRAMUSCULAR | Status: DC | PRN
  Administered 2022-12-21: 4 mg via INTRAVENOUS

## 2022-12-21 MED ORDER — ASPIRIN 81 MG PO TBEC: 81.0000 mg | DELAYED_RELEASE_TABLET | ORAL | Status: AC

## 2022-12-21 MED ORDER — ACETAMINOPHEN 10 MG/ML IV SOLN
INTRAVENOUS | Status: AC
  Filled 2022-12-21: qty 100

## 2022-12-21 MED ORDER — KETOROLAC TROMETHAMINE 30 MG/ML IJ SOLN
INTRAMUSCULAR | Status: AC
  Filled 2022-12-21: qty 1

## 2022-12-21 MED ORDER — NOREPINEPHRINE 4 MG/250ML-% IV SOLN
INTRAVENOUS | Status: DC | PRN
  Administered 2022-12-21: 3 ug/min via INTRAVENOUS

## 2022-12-21 MED ORDER — PROPOFOL 10 MG/ML IV BOLUS
INTRAVENOUS | Status: AC
  Filled 2022-12-21: qty 20

## 2022-12-21 MED ORDER — OXYCODONE HCL 5 MG PO TABS: 5.0000 mg | ORAL_TABLET | Freq: Four times a day (QID) | ORAL | 0 refills | Status: DC | PRN

## 2022-12-21 MED ORDER — PHENYLEPHRINE 80 MCG/ML (10ML) SYRINGE FOR IV PUSH (FOR BLOOD PRESSURE SUPPORT)
PREFILLED_SYRINGE | INTRAVENOUS | Status: DC | PRN
  Administered 2022-12-21 (×3): 80 ug via INTRAVENOUS

## 2022-12-21 MED ORDER — DOCUSATE SODIUM 100 MG PO CAPS: 100.0000 mg | ORAL_CAPSULE | Freq: Two times a day (BID) | ORAL | 2 refills | Status: AC

## 2022-12-21 MED ORDER — FENTANYL CITRATE (PF) 100 MCG/2ML IJ SOLN
INTRAMUSCULAR | Status: DC | PRN
  Administered 2022-12-21 (×4): 25 ug via INTRAVENOUS

## 2022-12-21 MED ORDER — LIDOCAINE HCL (PF) 2 % IJ SOLN
INTRAMUSCULAR | Status: DC | PRN
  Administered 2022-12-21: 50 mg via INTRADERMAL

## 2022-12-21 MED ORDER — SUGAMMADEX SODIUM 200 MG/2ML IV SOLN
INTRAVENOUS | Status: DC | PRN
  Administered 2022-12-21: 200 mg via INTRAVENOUS

## 2022-12-21 MED ORDER — ONDANSETRON HCL 4 MG/2ML IJ SOLN: 4.0000 mg | Freq: Once | INTRAMUSCULAR | Status: DC | PRN

## 2022-12-21 MED ORDER — LIDOCAINE HCL (PF) 2 % IJ SOLN
INTRAMUSCULAR | Status: AC
  Filled 2022-12-21: qty 5

## 2022-12-21 MED ORDER — CEFAZOLIN SODIUM-DEXTROSE 2-4 GM/100ML-% IV SOLN
2.0000 g | INTRAVENOUS | Status: AC
  Administered 2022-12-21: 2 g via INTRAVENOUS
  Filled 2022-12-21: qty 100

## 2022-12-21 MED ORDER — OXYCODONE HCL 5 MG/5ML PO SOLN: 5.0000 mg | Freq: Once | ORAL | Status: DC | PRN

## 2022-12-21 MED ORDER — BUPIVACAINE HCL (PF) 0.5 % IJ SOLN
INTRAMUSCULAR | Status: AC
  Filled 2022-12-21: qty 30

## 2022-12-21 MED ORDER — NOREPINEPHRINE 4 MG/250ML-% IV SOLN
INTRAVENOUS | Status: AC
  Filled 2022-12-21: qty 250

## 2022-12-21 MED ORDER — HYDROMORPHONE HCL 1 MG/ML IJ SOLN
0.2500 mg | INTRAMUSCULAR | Status: DC | PRN
Start: 2022-12-21 — End: 2022-12-21

## 2022-12-21 MED ORDER — FENTANYL CITRATE (PF) 100 MCG/2ML IJ SOLN
INTRAMUSCULAR | Status: AC
  Filled 2022-12-21: qty 2

## 2022-12-21 MED ORDER — ROCURONIUM BROMIDE 10 MG/ML (PF) SYRINGE
PREFILLED_SYRINGE | INTRAVENOUS | Status: AC
  Filled 2022-12-21: qty 10

## 2022-12-21 MED ORDER — ONDANSETRON HCL 4 MG/2ML IJ SOLN
INTRAMUSCULAR | Status: AC
  Filled 2022-12-21: qty 2

## 2022-12-21 MED ORDER — LACTATED RINGERS IV SOLN: INTRAVENOUS | Status: DC | PRN

## 2022-12-21 MED ORDER — ORAL CARE MOUTH RINSE: 15.0000 mL | Freq: Once | OROMUCOSAL | Status: AC

## 2022-12-21 MED ORDER — BUPIVACAINE HCL (PF) 0.5 % IJ SOLN
INTRAMUSCULAR | Status: DC | PRN
  Administered 2022-12-21: 30 mL

## 2022-12-21 MED ORDER — ONDANSETRON HCL 4 MG PO TABS: 4.0000 mg | ORAL_TABLET | Freq: Every day | ORAL | 1 refills | Status: DC | PRN

## 2022-12-21 SURGICAL SUPPLY — 50 items
BLADE SURG 15 STRL LF DISP TIS (BLADE) ×1 IMPLANT
CHLORAPREP W/TINT 26 (MISCELLANEOUS) ×1 IMPLANT
COVER LIGHT HANDLE STERIS (MISCELLANEOUS) ×2 IMPLANT
COVER MAYO STAND XLG (MISCELLANEOUS) ×1 IMPLANT
COVER TIP SHEARS 8 DVNC (MISCELLANEOUS) ×1 IMPLANT
DEFOGGER SCOPE WARMER CLEARIFY (MISCELLANEOUS) ×1 IMPLANT
DERMABOND ADVANCED .7 DNX12 (GAUZE/BANDAGES/DRESSINGS) ×1 IMPLANT
DRAPE ARM DVNC X/XI (DISPOSABLE) ×3 IMPLANT
DRAPE COLUMN DVNC XI (DISPOSABLE) ×1 IMPLANT
DRAPE HALF SHEET 70X43 (DRAPES) ×1 IMPLANT
DRIVER NDL MEGA SUTCUT DVNCXI (INSTRUMENTS) ×1 IMPLANT
DRIVER NDLE MEGA SUTCUT DVNCXI (INSTRUMENTS) ×1
ELECT REM PT RETURN 9FT ADLT (ELECTROSURGICAL) ×1
ELECTRODE REM PT RTRN 9FT ADLT (ELECTROSURGICAL) ×1 IMPLANT
FORCEPS BPLR R/ABLATION 8 DVNC (INSTRUMENTS) ×1 IMPLANT
GAUZE SPONGE 4X4 12PLY STRL (GAUZE/BANDAGES/DRESSINGS) ×1 IMPLANT
GLOVE BIOGEL PI IND STRL 6.5 (GLOVE) ×2 IMPLANT
GLOVE BIOGEL PI IND STRL 7.0 (GLOVE) ×4 IMPLANT
GLOVE SURG SS PI 6.5 STRL IVOR (GLOVE) ×2 IMPLANT
GOWN STRL REUS W/TWL LRG LVL3 (GOWN DISPOSABLE) ×3 IMPLANT
GRASPER SUT TROCAR 14GX15 (MISCELLANEOUS) IMPLANT
IRRIGATOR SUCT 8 DISP DVNC XI (IRRIGATION / IRRIGATOR) IMPLANT
KIT PINK PAD W/HEAD ARE REST (MISCELLANEOUS) ×1
KIT PINK PAD W/HEAD ARM REST (MISCELLANEOUS) ×1 IMPLANT
KIT TURNOVER KIT A (KITS) ×1 IMPLANT
MANIFOLD NEPTUNE II (INSTRUMENTS) ×1 IMPLANT
MESH PROGRIP LAP SLF FIX 16X12 (Mesh General) ×1 IMPLANT
NDL HYPO 21X1 ECLIPSE (NEEDLE) ×1 IMPLANT
NDL INSUFFLATION 14GA 120MM (NEEDLE) ×1 IMPLANT
NEEDLE HYPO 21X1 ECLIPSE (NEEDLE) ×1
NEEDLE INSUFFLATION 14GA 120MM (NEEDLE) ×1
OBTURATOR OPTICAL STND 8 DVNC (TROCAR) ×1
OBTURATOR OPTICALSTD 8 DVNC (TROCAR) ×1 IMPLANT
PACK LAP CHOLE LZT030E (CUSTOM PROCEDURE TRAY) ×1 IMPLANT
PENCIL HANDSWITCHING (ELECTRODE) ×1 IMPLANT
POSITIONER HEAD 8X9X4 ADT (SOFTGOODS) ×1 IMPLANT
SCISSORS MNPLR CVD DVNC XI (INSTRUMENTS) ×1 IMPLANT
SEAL UNIV 5-12 XI (MISCELLANEOUS) ×3 IMPLANT
SET BASIN LINEN APH (SET/KITS/TRAYS/PACK) ×1 IMPLANT
SET TUBE DA VINCI INSUFFLATOR (TUBING) IMPLANT
SET TUBE SMOKE EVAC HIGH FLOW (TUBING) IMPLANT
SOL PREP POV-IOD 4OZ 10% (MISCELLANEOUS) ×1 IMPLANT
SUT MNCRL AB 4-0 PS2 18 (SUTURE) ×1 IMPLANT
SUT STRATA 3-0 SH (SUTURE) ×1 IMPLANT
SUT V-LOC 90 ABS 3-0 VLT V-20 (SUTURE) ×1 IMPLANT
SUT VIC AB 2-0 SH 27X BRD (SUTURE) IMPLANT
SYR 30ML LL (SYRINGE) ×1 IMPLANT
TAPE TRANSPORE STRL 2 31045 (GAUZE/BANDAGES/DRESSINGS) ×1 IMPLANT
TRAY FOL W/BAG SLVR 16FR STRL (SET/KITS/TRAYS/PACK) ×1 IMPLANT
WATER STERILE IRR 500ML POUR (IV SOLUTION) ×1 IMPLANT

## 2022-12-21 NOTE — Anesthesia Preprocedure Evaluation (Addendum)
Anesthesia Evaluation  Patient identified by MRN, date of birth, ID band Patient awake    Reviewed: Allergy & Precautions, H&P , NPO status , Patient's Chart, lab work & pertinent test results, reviewed documented beta blocker date and time   Airway Mallampati: II  TM Distance: >3 FB Neck ROM: full    Dental no notable dental hx. (+) Dental Advisory Given, Teeth Intact   Pulmonary asthma    Pulmonary exam normal breath sounds clear to auscultation       Cardiovascular Exercise Tolerance: Good hypertension, + CAD  Normal cardiovascular exam+ Valvular Problems/Murmurs AS  Rhythm:regular Rate:Normal  Moderate to severe AS. Was told it wasn't bad enough to replace yet.  Mets 2 or so. Gets SOB walking dog or going up stairs but he can do it. Had nuclear stress test with trigeminy but no ischemia   Neuro/Psych TIA Neuromuscular disease  negative psych ROS   GI/Hepatic Neg liver ROS, PUD,GERD  ,,  Endo/Other  negative endocrine ROS    Renal/GU negative Renal ROS  negative genitourinary   Musculoskeletal   Abdominal Normal abdominal exam  (+)   Peds  Hematology negative hematology ROS (+)   Anesthesia Other Findings   Reproductive/Obstetrics negative OB ROS                             Anesthesia Physical Anesthesia Plan  ASA: 4  Anesthesia Plan: General   Post-op Pain Management: Dilaudid IV   Induction:   PONV Risk Score and Plan: Ondansetron, Dexamethasone and Midazolam  Airway Management Planned: Oral ETT  Additional Equipment: None and ClearSight  Intra-op Plan:   Post-operative Plan: Extubation in OR  Informed Consent: I have reviewed the patients History and Physical, chart, labs and discussed the procedure including the risks, benefits and alternatives for the proposed anesthesia with the patient or authorized representative who has indicated his/her understanding and acceptance.      Dental Advisory Given  Plan Discussed with: CRNA  Anesthesia Plan Comments:         Anesthesia Quick Evaluation

## 2022-12-21 NOTE — H&P (Signed)
[facility] History and Physical   Reason for Referral: Left inguinal hernia Referring Physician: Dr. Tasia Catchings   Chief Complaint   New Patient (Initial Visit)        Shane Osborne is a 82 y.o. male.  HPI: Patient presents for evaluation of a left inguinal hernia.  He states that he first noted the area a few months ago.  He does note bulging in his left groin and he has pain when he is moving and when he tries to get comfortable in bed at night.  He does have some intermittent constipation which changes to diarrhea.  He denies any nausea and vomiting.  His last bowel movement was this morning.  This area will with laying down.  His past medical history significant for aortic stenosis, hypertension, and hyperlipidemia.  He takes an 81 mg aspirin every other day.  He does have a history of an open right inguinal hernia repair.  He drinks 4 ounces of bourbon daily.  Denies use of tobacco products and illicit drugs.       Past Medical History:  Diagnosis Date   Aortic stenosis     Childhood asthma     Coronary atherosclerosis      Mild at cardiac catheterization 2018   Essential hypertension     GERD (gastroesophageal reflux disease)     Hyperlipidemia                 Past Surgical History:  Procedure Laterality Date   BIOPSY   07/27/2019    Procedure: BIOPSY;  Surgeon: Malissa Hippo, MD;  [facility];  Service: Endoscopy;;   COLONOSCOPY   10/01/2010    Procedure: COLONOSCOPY;  Surgeon: Malissa Hippo, MD;  [facility];  Service: Endoscopy;  Laterality: N/A;   COLONOSCOPY N/A 09/01/2013    Procedure: COLONOSCOPY;  Surgeon: Malissa Hippo, MD;  [facility];  Service: Endoscopy;  Laterality: N/A;  940   COLONOSCOPY N/A 07/27/2019    Procedure: COLONOSCOPY;  Surgeon: Malissa Hippo, MD;  [facility];  Service: Endoscopy;  Laterality: N/A;  1030   ESOPHAGOGASTRODUODENOSCOPY       ESOPHAGOGASTRODUODENOSCOPY N/A 04/11/2014     Procedure: ESOPHAGOGASTRODUODENOSCOPY (EGD);  Surgeon: Malissa Hippo, MD;  [facility];  Service: Endoscopy;  Laterality: N/A;  1200 - moved to 12:55 - Ann to notify pt   ESOPHAGOGASTRODUODENOSCOPY N/A 07/27/2019    Procedure: ESOPHAGOGASTRODUODENOSCOPY (EGD);  Surgeon: Malissa Hippo, MD;  [facility];  Service: Endoscopy;  Laterality: N/A;   LEFT HEART CATH AND CORONARY ANGIOGRAPHY N/A 03/23/2016    Procedure: Left Heart Cath and Coronary Angiography;  Surgeon: Tonny Bollman, MD;  Location: [facility] INVASIVE CV LAB;  Service: Cardiovascular;  Laterality: N/A;   POLYPECTOMY   07/27/2019    Procedure: POLYPECTOMY;  Surgeon: Malissa Hippo, MD;  [facility];  Service: Endoscopy;;   right leg   20 years ago    X 8 secondary to fracture   rt inguinal         hernia repair 2 months ago               Family History  Problem Relation Age of Onset   Cancer Mother     Heart attack Father            Social History  Social History         Tobacco Use  Smoking status: Never   Smokeless tobacco: Never  Vaping Use   Vaping status: Never Used  Substance Use Topics   Alcohol use: Yes      Alcohol/week: 5.0 standard drinks of alcohol      Types: 5 Shots of liquor per week      Comment: 2 oz in the afternoon   Drug use: No        Medications: I have reviewed the patient's current medications. Allergies as of 11/03/2022         Reactions    Other      [place] nuts - break out in hive throat swelling     Sulfa Antibiotics Rash    Vancomycin Rash            Medication List           Accurate as of November 03, 2022 11:59 PM. If you have any questions, ask your nurse or doctor.              albuterol 108 (90 Base) MCG/ACT inhaler Commonly known as: VENTOLIN HFA Inhale 2 puffs into the lungs every 6 (six) hours as needed for wheezing or shortness of breath.    aspirin EC 81 MG tablet Take 1 tablet (81 mg total) by mouth every other day.  Swallow whole.    betamethasone valerate lotion 0.1 % Commonly known as: VALISONE Apply 1 application topically daily as needed for irritation.    carvedilol 12.5 MG tablet Commonly known as: COREG TAKE 1 TABLET BY MOUTH TWICE DAILY    clotrimazole-betamethasone cream Commonly known as: LOTRISONE Apply 1 application topically 2 (two) times daily.    diclofenac 75 MG EC tablet Commonly known as: VOLTAREN Take 75 mg by mouth 2 (two) times daily.    Fish Oil 1200 MG Caps Take 1 capsule by mouth 2 (two) times daily before a meal.    fluticasone 0.05 % cream Commonly known as: CUTIVATE Apply 1 application topically daily as needed (irritation).    furosemide 20 MG tablet Commonly known as: LASIX TAKE 1 TABLET BY MOUTH EVERY DAY AS NEEDED FOR SWELLING    irbesartan 300 MG tablet Commonly known as: AVAPRO TAKE 1 TABLET BY MOUTH EVERY DAY    ketoconazole 2 % cream Commonly known as: NIZORAL Apply 1 application topically 2 (two) times daily as needed for irritation.    multivitamin tablet Take 1 tablet by mouth daily.    polyethylene glycol 17 g packet Commonly known as: MIRALAX / GLYCOLAX Take 17 g by mouth 2 (two) times daily.    PROBIOTIC PO Take 1 capsule by mouth daily.    psyllium 58.6 % packet Commonly known as: METAMUCIL Take 1 packet by mouth 2 (two) times daily.    rosuvastatin 20 MG tablet Commonly known as: CRESTOR Take 20 mg by mouth daily.               ROS:  Constitutional: negative for chills, fatigue, and fevers Eyes: negative for visual disturbance and pain Ears, nose, mouth, throat, and face: negative for ear drainage, sore throat, and sinus problems Respiratory: positive for shortness of breath, negative for cough and wheezing Cardiovascular: negative for chest pain and palpitations Gastrointestinal: positive for abdominal pain, negative for nausea, reflux symptoms, and vomiting Genitourinary:positive for frequency, negative for  dysuria Integument/breast: negative for dryness and rash Hematologic/lymphatic: negative for bleeding and lymphadenopathy Musculoskeletal:positive for neck pain, negative for back pain Neurological: negative for dizziness and tremors Endocrine: negative for  temperature intolerance   Blood pressure 134/65, pulse (!) 58, temperature 98 F (36.7 C), temperature source Oral, resp. rate 12, height 5\' 8"  (1.727 m), weight 183 lb (83 kg), SpO2 94%. Physical Exam Vitals reviewed.  Constitutional:      Appearance: Normal appearance.  HENT:     Head: Normocephalic and atraumatic.  Eyes:     Extraocular Movements: Extraocular movements intact.     Pupils: Pupils are equal, round, and reactive to light.  Cardiovascular:     Rate and Rhythm: Normal rate and regular rhythm.     Heart sounds: Murmur heard.  Pulmonary:     Effort: Pulmonary effort is normal.     Breath sounds: Normal breath sounds.  Abdominal:     Comments: Abdomen soft, nondistended, no percussion tenderness, nontender to palpation; no rigidity, guarding, rebound tenderness; soft and reducible left inguinal hernia  Musculoskeletal:        General: Normal range of motion.     Cervical back: Normal range of motion.  Skin:    General: Skin is warm and dry.  Neurological:     General: No focal deficit present.     Mental Status: He is alert and oriented to person, place, and time.  Psychiatric:        Mood and Affect: Mood normal.        Behavior: Behavior normal.        Results: Lab Results Last 48 Hours  No results found for this or any previous visit (from the past 48 hour(s)).     Imaging Results (Last 48 hours)  No results found.       Assessment & Plan:  JALEN GORING is a 82 y.o. male who presents for evaluation of a left inguinal hernia.   -I discussed the pathophysiology of inguinal hernias and we discussed the need for surgical repair -The risk and benefits of robotic assisted laparoscopic left inguinal  hernia repair with mesh were discussed including but not limited to bleeding, infection, injury to surrounding structures, need for additional procedures, and hernia recurrence.  After careful consideration, Shane Osborne has decided to proceed with surgery.  -Patient was cleared for surgery from a cardiac standpoint -Will plan to proceed with surgery today -Information provided to the patient regarding inguinal hernias     All questions were answered to the satisfaction of the patient and family.     Theophilus Kinds, DO [facility] [address] [place], [place] [address] [phone] (office)

## 2022-12-21 NOTE — Anesthesia Procedure Notes (Signed)
Procedure Name: Intubation Date/Time: 12/21/2022 10:04 AM  Performed by: Cy Blamer, CRNAPre-anesthesia Checklist: Patient identified, Emergency Drugs available, Suction available and Patient being monitored Patient Re-evaluated:Patient Re-evaluated prior to induction Oxygen Delivery Method: Circle system utilized Preoxygenation: Pre-oxygenation with 100% oxygen Induction Type: IV induction Ventilation: Mask ventilation without difficulty Laryngoscope Size: Miller and 2 Grade View: Grade I Tube type: Oral Tube size: 7.0 mm Number of attempts: 1 Airway Equipment and Method: Stylet and Bite block Placement Confirmation: ETT inserted through vocal cords under direct vision, positive ETCO2 and breath sounds checked- equal and bilateral Secured at: 23 cm Tube secured with: Tape Dental Injury: Teeth and Oropharynx as per pre-operative assessment

## 2022-12-21 NOTE — Telephone Encounter (Signed)
Received VM from patient after hours on 12/18/2022 at 1:30 PM. Reports that he has a cold and is being seen by PCP later on 12/18/2022. Inquired if he would need to re-schedule surgery.   Received VM from patient after hours on 12/18/2022 at 3 PM.  Reports that he was seen by PCP and given prednisone injection in office. PCP recommended that he should not need to reschedule his surgery as long as he is not actively running a fever.   Noted that patient has already checked in for scheduled surgery. Dr. Robyne Peers to be made aware.

## 2022-12-21 NOTE — Anesthesia Postprocedure Evaluation (Signed)
Anesthesia Post Note  Patient: Shane Osborne  Procedure(s) Performed: XI ROBOTIC ASSISTED INGUINAL HERNIA REPAIR WITH MESH (Left: Abdomen)  Patient location during evaluation: PACU Anesthesia Type: General Level of consciousness: awake and alert Pain management: pain level controlled Vital Signs Assessment: post-procedure vital signs reviewed and stable Respiratory status: spontaneous breathing, nonlabored ventilation, respiratory function stable and patient connected to nasal cannula oxygen Cardiovascular status: blood pressure returned to baseline and stable Postop Assessment: no apparent nausea or vomiting Anesthetic complications: no   There were no known notable events for this encounter.   Last Vitals:  Vitals:   12/21/22 1345 12/21/22 1405  BP: (!) 140/81 (!) 143/63  Pulse: (!) 42 64  Resp: 15 18  Temp:  (!) 36.4 C  SpO2: 98% 95%    Last Pain:  Vitals:   12/21/22 1405  TempSrc: Oral  PainSc: 0-No pain                 Jeanee Fabre L Cyra Spader

## 2022-12-21 NOTE — Transfer of Care (Signed)
Immediate Anesthesia Transfer of Care Note  Patient: Shane Osborne  Procedure(s) Performed: XI ROBOTIC ASSISTED INGUINAL HERNIA REPAIR WITH MESH (Left: Abdomen)  Patient Location: PACU  Anesthesia Type:General  Level of Consciousness: awake, alert , and oriented  Airway & Oxygen Therapy: Patient Spontanous Breathing and Patient connected to nasal cannula oxygen  Post-op Assessment: Report given to RN, Post -op Vital signs reviewed and stable, Patient moving all extremities X 4, and Patient able to stick tongue midline  Post vital signs: Reviewed and stable  Last Vitals:  Vitals Value Taken Time  BP 134/76 12/21/22 1257  Temp 98.6   Pulse 82 12/21/22 1258  Resp 20 12/21/22 1258  SpO2 97 % 12/21/22 1258  Vitals shown include unfiled device data.  Last Pain:  Vitals:   12/21/22 0908  PainSc: 0-No pain         Complications: No notable events documented.

## 2022-12-21 NOTE — Discharge Instructions (Signed)
Ambulatory Surgery Discharge Instructions  General Anesthesia or Sedation Do not drive or operate heavy machinery for 24 hours.  Do not consume alcohol, tranquilizers, sleeping medications, or any non-prescribed medications for 24 hours. Do not make important decisions or sign any important papers in the next 24 hours. You should have someone with you tonight at home.  Activity  You are advised to go directly home from the hospital.  Restrict your activities and rest for a day.  Resume light activity tomorrow. No heavy lifting over 10 lbs or strenuous exercise.  Fluids and Diet Begin with clear liquids, bouillon, dry toast, soda crackers.  If not nauseated, you may go to a regular diet when you desire.  Greasy and spicy foods are not advised.  Medications  If you have not had a bowel movement in 24 hours, take 2 tablespoons over the counter Milk of mag.             You May resume your blood thinners tomorrow (Aspirin, coumadin, or other).  You are being discharged with prescriptions for Opioid/Narcotic Medications: There are some specific considerations for these medications that you should know. Opioid Meds have risks & benefits. Addiction to these meds is always a concern with prolonged use Take medication only as directed Do not drive while taking narcotic pain medication Do not crush tablets or capsules Do not use a different container than medication was dispensed in Lock the container of medication in a cool, dry place out of reach of children and pets. Opioid medication can cause addiction Do not share with anyone else (this is a felony) Do not store medications for future use. Dispose of them properly.     Disposal:  Find a [employer] household drug take back site near you.  If you can't get to a drug take back site, use the recipe below as a last resort to dispose of expired, unused or unwanted drugs. Disposal  (Do not dispose chemotherapy drugs this way, talk to your  prescribing doctor instead.) Step 1: Mix drugs (do not crush) with dirt, kitty litter, or used coffee grounds and add a small amount of water to dissolve any solid medications. Step 2: Seal drugs in plastic bag. Step 3: Place plastic bag in trash. Step 4: Take prescription container and scratch out personal information, then recycle or throw away.  Operative Site  You have a liquid bandage over your incisions, this will begin to flake off in about a week. Ok to English as a second language teacher. Keep wound clean and dry. No baths or swimming. No lifting more than 10 pounds.  Contact Information: If you have questions or concerns, please call our office, [phone], Monday- Thursday 8AM-5PM and Friday 8AM-12Noon.  If it is after hours or on the weekend, please call [facility], [phone], and ask to speak to the surgeon on call for Dr. Robyne Peers at [facility].   SPECIFIC COMPLICATIONS TO WATCH FOR: Inability to urinate Fever over 101? F by mouth Nausea and vomiting lasting longer than 24 hours. Pain not relieved by medication ordered Swelling around the operative site Increased redness, warmth, hardness, around operative area Numbness, tingling, or cold fingers or toes Blood -soaked dressing, (small amounts of oozing may be normal) Increasing and progressive drainage from surgical area or exam site

## 2022-12-21 NOTE — Progress Notes (Signed)
[facility]  Spoke with the patient's family in the consultation room.  I explained that he tolerated the procedure without difficulty.  He has dissolvable stitches under the skin with overlying skin glue.  This will flake off in 10 to 14 days.  I discharged him home with a prescription for narcotic pain medication that they should take as needed for pain.  I also want him taking scheduled Tylenol.  If they take the narcotic pain medication, they should take a stool softener as well.  The patient will follow-up with me in 2 weeks.  All questions were answered to their expressed satisfaction.  Shane Kinds, DO [facility] [address] [place], [place] [address] [phone] (office)

## 2022-12-21 NOTE — Op Note (Addendum)
[facility] Operative Note  12/21/22  Preoperative Diagnosis: Left inguinal Hernia   Postoperative Diagnosis: Incarcerated left inguinal hernia   Procedure(s) Performed: Robotic assisted laparoscopic left inguinal hernia repair with mesh   Surgeon: Theophilus Kinds, DO   Assistants: No qualified resident was available    Anesthesia: General endotracheal   Anesthesiologist: Ronelle Nigh, MD    Specimens: None   Estimated Blood Loss: Minimal   Blood Replacement: None    Complications: None   Wound Class:Clean   Operative Indications: The patient has a left inguinal hernia that is symptomatic and they want repaired. We discussed robotic assisted laparoscopic inguinal hernia repair and risk of bleeding, infection, issues with chronic pain post operatively, use of mesh, risk of recurrence, chance of needing to repair a bilateral hernia, risk of injury to bowel or bladder, and risk of injury to cord structures for male patients.   Findings: Vas Deferens and cord structures identified and preserved Progrip Laparoscopic Mesh Hemostasis achieved   Procedure: The patient was taken to the operating room and placed supine. General endotracheal anesthesia was induced. Intravenous antibiotics were administered per protocol.  A foley catheter was placed and a orogastric tube positioned to decompress the stomach. The abdomen was prepared and draped in the usual sterile fashion.  A time-out was completed verifying correct patient, procedure, site, positioning, and implant(s) and/or special equipment prior to beginning this procedure.  An incision was marked 20 cm above the pubic tubercle, slightly above the umbilicus. Veress needle inserted at the supraumbilical site.  Saline drop test noted to be positive with gradual increase in pressure after initiation of gas insufflation.  15 mm of pressure was achieved prior to removing the Veress needle and then placing a 8 mm  port via the Optiview technique through the supraumbilical site that had been previously marked.  Inspection of the area afterwards noted no injury to the surrounding organs during insertion of the needle and the port.  2 port sites were marked 8 cm to the lateral sides of the initial port, and a 8 mm robotic port was placed on the left side, another 8 mm robotic port on the right side under direct supervision. The [employer] platform was then brought into the operative field and docked to the ports.  Examination of the abdominal cavity noted an incarcerated left inguinal hernia containing colon.  Lysis of adhesions was performed to separate the colon from the peritoneal flap.  A peritoneal flap was then created approximately 8 cm cephalad to the defect by using scissors with electrocautery.  Dissection was carried down towards the pubic tubercle, developing the myopectineal orifice view. Laterally the flap was carried towards the ASIS.  A large indirect hernia sac was noted, which carefully dissected away from the adjacent tissues to be fully reduced out of hernia cavity.  Any bleeding was controlled with combination of electrocautery and manual pressure.  After confirming adequate dissection and the peritoneal reflection completely down and away from the cord structures, a Progrip laparoscopic mesh was inserted.  After noting proper placement of the mesh with the peritoneal reflection deep to it, the previously created peritoneal flap was secured back up to the anterior abdominal wall using running 3-0 V-Loc. Any holes created in the peritoneal flap were closed with 3-0 V-Loc in a running fashion. All needles were then removed out of the abdominal cavity, Xi platform undocked from the ports and removed off of operative field.  Marcaine was instilled at the incision sites.  The abdomen was then desufflated and ports removed. All skin incisions were closed with a subcuticular stitch of Monocryl 4-0.  Dermabond was applied. The testis was gently pulled down into its anatomic position in the scrotum.  Final inspection revealed acceptable hemostasis. All counts were correct at the end of the case. The patient was awakened from anesthesia and extubated without complication.  The patient went to the PACU in stable condition.   Theophilus Kinds, DO [facility] [address] [place], [place] [address] [phone] (office)

## 2022-12-23 ENCOUNTER — Encounter (HOSPITAL_COMMUNITY): Payer: Self-pay | Admitting: Surgery

## 2023-01-05 ENCOUNTER — Encounter: Payer: Self-pay | Admitting: Surgery

## 2023-01-05 ENCOUNTER — Ambulatory Visit: Payer: Medicare Other | Admitting: Surgery

## 2023-01-05 ENCOUNTER — Other Ambulatory Visit: Payer: Self-pay

## 2023-01-05 VITALS — BP 146/75 | HR 58 | Temp 98.1°F | Resp 16 | Ht 68.0 in | Wt 177.0 lb

## 2023-01-05 DIAGNOSIS — Z09 Encounter for follow-up examination after completed treatment for conditions other than malignant neoplasm: Secondary | ICD-10-CM

## 2023-01-05 NOTE — Progress Notes (Signed)
[facility] Note   HPI:  82 y.o. Male presents to clinic for post-op follow-up status post robotic assisted laparoscopic right inguinal hernia repair with mesh on 11/25.  Patient has been doing very well since his surgery.  He denies any further abdominal pain.  He is tolerating a diet without nausea and vomiting, moving his bowels without issue.  Denies fevers and chills.  He also denies any issues at his incision sites.  Review of Systems:  All other review of systems: otherwise negative   Vital Signs:  BP (!) 146/75   Pulse (!) 58   Temp 98.1 F (36.7 C) (Oral)   Resp 16   Ht 5\' 8"  (1.727 m)   Wt 177 lb (80.3 kg)   SpO2 95%   BMI 26.91 kg/m    Physical Exam:  Physical Exam Vitals reviewed.  Constitutional:      Appearance: Normal appearance.  Abdominal:     Comments: Abdomen soft, nondistended, no percussion tenderness, nontender to palpation; no rigidity, guarding, rebound tenderness; laparoscopic incision sites healing well with Dermabond still in place, improving ecchymosis, mild swelling in left groin without any ecchymosis  Neurological:     Mental Status: He is alert.     Laboratory studies: None  Imaging:  None  Assessment:  82 y.o. yo Male who presents for follow-up status post robotic assisted laparoscopic right inguinal hernia repair with mesh on 11/25.  Plan:  -Patient overall doing very well since the surgery.  Tolerating a diet, moving his bowels, and pain well-controlled -Advised that he needs to continue taking it easy for the next 2 weeks with no significant heavy lifting over 10 pounds.  Advised that if his pain feels well-controlled and he is not taking narcotic pain medications, he can resume driving -Also advised that he can use antibiotic ointment on his incision sites prior to showering to help loosen remaining skin glue -Follow up with me as needed  All of the above recommendations were discussed with the patient and patient's  family, and all of patient's and family's questions were answered to their expressed satisfaction.  Theophilus Kinds, DO [facility] [address] [place], [place] [address] [phone] (office)

## 2023-02-01 ENCOUNTER — Ambulatory Visit: Payer: Medicare Other | Admitting: Internal Medicine

## 2023-02-01 ENCOUNTER — Other Ambulatory Visit: Payer: Medicare Other

## 2023-02-10 ENCOUNTER — Ambulatory Visit: Payer: Medicare Other | Attending: Cardiology

## 2023-02-10 DIAGNOSIS — I350 Nonrheumatic aortic (valve) stenosis: Secondary | ICD-10-CM

## 2023-02-10 LAB — ECHOCARDIOGRAM COMPLETE
AR max vel: 1.29 cm2
AV Area VTI: 1.21 cm2
AV Area mean vel: 1.23 cm2
AV Mean grad: 20.7 mm[Hg]
AV Peak grad: 36.7 mm[Hg]
Ao pk vel: 3.03 m/s
Area-P 1/2: 2.96 cm2
Calc EF: 66.9 %
MV VTI: 2.02 cm2
S' Lateral: 3 cm
Single Plane A2C EF: 68.7 %
Single Plane A4C EF: 61.7 %

## 2023-03-24 ENCOUNTER — Ambulatory Visit: Payer: Medicare Other | Admitting: Internal Medicine

## 2023-03-30 DIAGNOSIS — Z0000 Encounter for general adult medical examination without abnormal findings: Secondary | ICD-10-CM | POA: Diagnosis not present

## 2023-03-30 DIAGNOSIS — Z299 Encounter for prophylactic measures, unspecified: Secondary | ICD-10-CM | POA: Diagnosis not present

## 2023-03-30 DIAGNOSIS — I10 Essential (primary) hypertension: Secondary | ICD-10-CM | POA: Diagnosis not present

## 2023-03-30 DIAGNOSIS — I700 Atherosclerosis of aorta: Secondary | ICD-10-CM | POA: Diagnosis not present

## 2023-03-30 DIAGNOSIS — I739 Peripheral vascular disease, unspecified: Secondary | ICD-10-CM | POA: Diagnosis not present

## 2023-03-30 DIAGNOSIS — Z7189 Other specified counseling: Secondary | ICD-10-CM | POA: Diagnosis not present

## 2023-04-18 NOTE — Progress Notes (Unsigned)
 Shane Osborne, male    DOB: March 17, 1940    MRN: [id number]   Brief patient profile:  48  yowm  never smoker  former [place] pt self-referred back to pulmonary clinic in [place]  04/22/2023  for doe thought to maybe be ILD but mostly limited by valvular heart dz.   History of Present Illness  04/22/2023  Pulmonary/ 1st office eval/ Nathon Stefanski /  Office  Chief Complaint  Patient presents with   transfer of care    Former Dr Vassie Loll   Dyspnea: walks dog x half an hour slow pace / still doing steps at home / no change ex tol Cough: none  Sleep: 6 in blocks/ one pillow / bothered by insomnia  SABA use: none  02 VOZ:DGUY     No obvious day to day or daytime pattern/variability or assoc excess/ purulent sputum or mucus plugs or hemoptysis or cp or chest tightness, subjective wheeze or overt sinus or hb symptoms.    Also denies any obvious fluctuation of symptoms with weather or environmental changes or other aggravating or alleviating factors except as outlined above   No unusual exposure hx or h/o childhood pna/ asthma or knowledge of premature birth.  Current Allergies, Complete Past Medical History, Past Surgical History, Family History, and Social History were reviewed in [employer] record.  ROS  The following are not active complaints unless bolded Hoarseness, sore throat, dysphagia, dental problems, itching, sneezing,  nasal congestion or discharge of excess mucus or purulent secretions, ear ache,   fever, chills, sweats, unintended wt loss or wt gain, classically pleuritic or exertional cp,  orthopnea pnd or arm/hand swelling  or leg swelling, presyncope, palpitations, abdominal pain, anorexia, nausea, vomiting, diarrhea  or change in bowel habits or change in bladder habits, change in stools or change in urine, dysuria, hematuria,  rash, arthralgias, visual complaints, headache, numbness, weakness or ataxia or problems with walking or coordination,  change in  mood or  memory.            Outpatient Medications Prior to Visit  Medication Sig Dispense Refill   albuterol (VENTOLIN HFA) 108 (90 Base) MCG/ACT inhaler Inhale 2 puffs into the lungs every 6 (six) hours as needed for wheezing or shortness of breath. (Patient not taking: Reported on 01/05/2023) 8 g 6   aspirin EC 81 MG tablet Take 1 tablet (81 mg total) by mouth every other day. Swallow whole. (Patient not taking: Reported on 01/05/2023)     betamethasone valerate lotion (VALISONE) 0.1 % Apply 1 application topically daily as needed for irritation.     carvedilol (COREG) 12.5 MG tablet TAKE 1 TABLET BY MOUTH TWICE DAILY 180 tablet 1   clotrimazole-betamethasone (LOTRISONE) cream Apply 1 application topically 2 (two) times daily. 30 g 11   diclofenac (VOLTAREN) 75 MG EC tablet Take 75 mg by mouth 2 (two) times daily as needed for moderate pain (pain score 4-6).     docusate sodium (COLACE) 100 MG capsule Take 1 capsule (100 mg total) by mouth 2 (two) times daily. 60 capsule 2   Fluocinolone Acetonide Scalp 0.01 % OIL Apply 1 Application topically at bedtime as needed (dry scalp).     fluticasone (CUTIVATE) 0.05 % cream Apply 1 application topically daily as needed (irritation).     furosemide (LASIX) 20 MG tablet TAKE 1 TABLET BY MOUTH EVERY DAY AS NEEDED FOR SWELLING 90 tablet 0   irbesartan (AVAPRO) 300 MG tablet TAKE 1 TABLET BY MOUTH EVERY  DAY 30 tablet 6   ketoconazole (NIZORAL) 2 % cream Apply 1 application topically 2 (two) times daily as needed for irritation.     Multiple Vitamin (MULTIVITAMIN) tablet Take 1 tablet by mouth daily.     Omega-3 Fatty Acids (FISH OIL) 1200 MG CAPS Take 1,200 mg by mouth 2 (two) times daily before a meal.     polyethylene glycol (MIRALAX / GLYCOLAX) 17 g packet Take 17 g by mouth daily.     Probiotic Product (PROBIOTIC PO) Take 1 capsule by mouth daily.      psyllium (METAMUCIL) 58.6 % packet Take 1 packet by mouth daily.     rosuvastatin (CRESTOR) 20 MG  tablet Take 20 mg by mouth daily.     No facility-administered medications prior to visit.    Past Medical History:  Diagnosis Date   Aortic stenosis    Childhood asthma    Coronary atherosclerosis    Mild at cardiac catheterization 2018   Essential hypertension    GERD (gastroesophageal reflux disease)    Hyperlipidemia    TIA (transient ischemic attack)    left arm weakness      Objective:     BP (!) 162/74   Pulse 62   Ht 5\' 8"  (1.727 m)   Wt 183 lb 6.4 oz (83.2 kg)   SpO2 95% Comment: room air  BMI 27.89 kg/m   SpO2: 95 % (room air) very pleasant amb wm nad   HEENT : Oropharynx  clear    Nasal turbinates nl    NECK :  without  apparent JVD/ palpable Nodes/TM    LUNGS: no acc muscle use,  Nl contour chest which is clear to A and P bilaterally without cough on insp or exp maneuvers   CV:  RRR  no s3  with 2-3/6 SEM s increase in P2, and no edema   ABD:  soft and nontender   MS:  Gait nl   ext warm without deformities Or obvious joint restrictions  calf tenderness, cyanosis or clubbing    SKIN: warm and dry without lesions    NEURO:  alert, approp, nl sensorium with  no motor or cerebellar deficits apparent.     CXR PA and Lateral:   04/22/2023 :    I personally reviewed images and impression is as follows:       Mod kyposis/ R HD elevation / chronic - overall better aeration than prior cxrs Assessment   Dyspnea on exertion -Elevated L HD since 02/13/09 CTa chest  -Cath 03/23/16 and LVEDP 20  -05/19/2016  Walked RA x 3 laps @ 185 ft each stopped due to  End of study, nl pace, mild sob - no  desat    - Spirometry 05/19/2016  FEV1 2.05 (89%)  Ratio 76 with classic upper airway truncation pattern - HRCT  01/06/22   bland appearing scarring of the right-greater-than-left lung bases, with elevation of the right hemidiaphragm, as well as additional bandlike scarring in the anterior right upper lobe. No evidence of fibrotic  ILD  - echo 02/10/23 1. Left  ventricular ejection fraction, by estimation, is 60 to 65%. The  left ventricle has normal function. The left ventricle has no regional  wall motion abnormalities. Left ventricular diastolic parameters are  consistent with Grade I diastolic  dysfunction (impaired relaxation). Elevated left ventricular end-diastolic  pressure.   2. Right ventricular systolic function is normal. The right ventricular  size is normal.   3. The mitral valve is  normal in structure. Trivial mitral valve  regurgitation. No evidence of mitral stenosis.   4. The aortic valve is calcified. There is moderate calcification of the  aortic valve. Aortic valve regurgitation is trivial. Moderate aortic valve  stenosis. Aortic valve area, by VTI measures 1.21 cm. Aortic valve mean  gradient measures 20.7 mmHg.  Aortic valve Vmax measures 3.03 m/s. DVI is 0.38.   5. Aortic dilatation noted. There is borderline dilatation of the aortic  root, measuring 38 mm. There is borderline dilatation of the ascending  aorta, measuring 36 mm.   Comparison(s): No significant change from prior study   No significant reversible pulmonary problem identified and he has moderate Valvular heart dz so referred back to cardiology for now/ f/u here can be prn   Upper airway cough syndrome Trial off acei  05/19/16 plus gerd rx > much better 08/19/2016 so rec remain off acei and  taper acid suppression  off  No flares off acei,  no further w/u needed   Essential hypertension Trial off acei 05/19/2016  Due to atypical sob/ throat clearing  > sob resolved, min throat clearing 08/19/2016 so rec leave off indefinitely   Bp not ideal but has f/u with cards planned for next week so no change rx for now          Each maintenance medication was reviewed in detail including emphasizing most importantly the difference between maintenance and prns and under what circumstances the prns are to be triggered using an action plan format where  appropriate.  Total time for H and P, chart review, counseling,  and generating customized AVS unique to this office visit / same day charting = 30  min with pt not seen by me in > 3 y          Sandrea Hughs, MD 04/22/2023

## 2023-04-22 ENCOUNTER — Ambulatory Visit: Payer: Medicare Other | Admitting: Internal Medicine

## 2023-04-22 ENCOUNTER — Encounter: Payer: Self-pay | Admitting: Internal Medicine

## 2023-04-22 ENCOUNTER — Ambulatory Visit (HOSPITAL_COMMUNITY)
Admission: RE | Admit: 2023-04-22 | Discharge: 2023-04-22 | Disposition: A | Discharge status: Home / Self Care | Source: Ambulatory Visit | Attending: Internal Medicine | Admitting: Internal Medicine

## 2023-04-22 VITALS — BP 162/74 | HR 62 | Ht 68.0 in | Wt 183.4 lb

## 2023-04-22 DIAGNOSIS — R9389 Abnormal findings on diagnostic imaging of other specified body structures: Secondary | ICD-10-CM | POA: Diagnosis not present

## 2023-04-22 DIAGNOSIS — R0609 Other forms of dyspnea: Secondary | ICD-10-CM

## 2023-04-22 DIAGNOSIS — I10 Essential (primary) hypertension: Secondary | ICD-10-CM | POA: Diagnosis not present

## 2023-04-22 DIAGNOSIS — I700 Atherosclerosis of aorta: Secondary | ICD-10-CM | POA: Diagnosis not present

## 2023-04-22 DIAGNOSIS — R058 Other specified cough: Secondary | ICD-10-CM

## 2023-04-22 DIAGNOSIS — I350 Nonrheumatic aortic (valve) stenosis: Secondary | ICD-10-CM | POA: Diagnosis not present

## 2023-04-22 NOTE — Assessment & Plan Note (Addendum)
 Trial off acei 05/19/2016  Due to atypical sob/ throat clearing  > sob resolved, min throat clearing 08/19/2016 so rec leave off indefinitely   Bp not ideal but has f/u with cards planned for next week so no change rx for now          Each maintenance medication was reviewed in detail including emphasizing most importantly the difference between maintenance and prns and under what circumstances the prns are to be triggered using an action plan format where appropriate.  Total time for H and P, chart review, counseling,  and generating customized AVS unique to this office visit / same day charting = 30  min with pt not seen by me in > 3 y

## 2023-04-22 NOTE — Assessment & Plan Note (Addendum)
-  Elevated L HD since 02/13/09 CTa chest  -Cath 03/23/16 and LVEDP 20  -05/19/2016  Walked RA x 3 laps @ 185 ft each stopped due to  End of study, nl pace, mild sob - no  desat    - Spirometry 05/19/2016  FEV1 2.05 (89%)  Ratio 76 with classic upper airway truncation pattern - HRCT  01/06/22   bland appearing scarring of the right-greater-than-left lung bases, with elevation of the right hemidiaphragm, as well as additional bandlike scarring in the anterior right upper lobe. No evidence of fibrotic  ILD  - echo 02/10/23 1. Left ventricular ejection fraction, by estimation, is 60 to 65%. The  left ventricle has normal function. The left ventricle has no regional  wall motion abnormalities. Left ventricular diastolic parameters are  consistent with Grade I diastolic  dysfunction (impaired relaxation). Elevated left ventricular end-diastolic  pressure.   2. Right ventricular systolic function is normal. The right ventricular  size is normal.   3. The mitral valve is normal in structure. Trivial mitral valve  regurgitation. No evidence of mitral stenosis.   4. The aortic valve is calcified. There is moderate calcification of the  aortic valve. Aortic valve regurgitation is trivial. Moderate aortic valve  stenosis. Aortic valve area, by VTI measures 1.21 cm. Aortic valve mean  gradient measures 20.7 mmHg.  Aortic valve Vmax measures 3.03 m/s. DVI is 0.38.   5. Aortic dilatation noted. There is borderline dilatation of the aortic  root, measuring 38 mm. There is borderline dilatation of the ascending  aorta, measuring 36 mm.   Comparison(s): No significant change from prior study   No significant reversible pulmonary problem identified and he has moderate Valvular heart dz so referred back to cardiology for now/ f/u here can be prn

## 2023-04-22 NOTE — Patient Instructions (Signed)
 No need for pulmonary follow up at this point - keep your cardiology appts

## 2023-04-22 NOTE — Assessment & Plan Note (Addendum)
 Trial off acei  05/19/16 plus gerd rx > much better 08/19/2016 so rec remain off acei and  taper acid suppression  off  No flares off acei,  no further w/u needed

## 2023-04-28 ENCOUNTER — Ambulatory Visit: Payer: Medicare Other | Admitting: Cardiology

## 2023-05-03 ENCOUNTER — Other Ambulatory Visit: Payer: Self-pay | Admitting: Cardiology

## 2023-05-11 DIAGNOSIS — R0781 Pleurodynia: Secondary | ICD-10-CM | POA: Diagnosis not present

## 2023-05-11 DIAGNOSIS — M47812 Spondylosis without myelopathy or radiculopathy, cervical region: Secondary | ICD-10-CM | POA: Diagnosis not present

## 2023-05-11 DIAGNOSIS — W108XXA Fall (on) (from) other stairs and steps, initial encounter: Secondary | ICD-10-CM | POA: Diagnosis not present

## 2023-05-11 DIAGNOSIS — S7001XA Contusion of right hip, initial encounter: Secondary | ICD-10-CM | POA: Diagnosis not present

## 2023-05-11 DIAGNOSIS — J9811 Atelectasis: Secondary | ICD-10-CM | POA: Diagnosis not present

## 2023-05-11 DIAGNOSIS — M5030 Other cervical disc degeneration, unspecified cervical region: Secondary | ICD-10-CM | POA: Diagnosis not present

## 2023-05-11 DIAGNOSIS — S20211A Contusion of right front wall of thorax, initial encounter: Secondary | ICD-10-CM | POA: Diagnosis not present

## 2023-05-11 DIAGNOSIS — S0101XA Laceration without foreign body of scalp, initial encounter: Secondary | ICD-10-CM | POA: Diagnosis not present

## 2023-05-11 DIAGNOSIS — Z882 Allergy status to sulfonamides status: Secondary | ICD-10-CM | POA: Diagnosis not present

## 2023-05-11 DIAGNOSIS — M25551 Pain in right hip: Secondary | ICD-10-CM | POA: Diagnosis not present

## 2023-05-11 DIAGNOSIS — M160 Bilateral primary osteoarthritis of hip: Secondary | ICD-10-CM | POA: Diagnosis not present

## 2023-05-21 DIAGNOSIS — I10 Essential (primary) hypertension: Secondary | ICD-10-CM | POA: Diagnosis not present

## 2023-05-21 DIAGNOSIS — Z4802 Encounter for removal of sutures: Secondary | ICD-10-CM | POA: Diagnosis not present

## 2023-06-17 ENCOUNTER — Ambulatory Visit: Attending: Nurse Practitioner | Admitting: Nurse Practitioner

## 2023-06-17 ENCOUNTER — Encounter: Payer: Self-pay | Admitting: Nurse Practitioner

## 2023-06-17 VITALS — BP 118/70 | HR 67 | Ht 69.0 in | Wt 180.4 lb

## 2023-06-17 DIAGNOSIS — I350 Nonrheumatic aortic (valve) stenosis: Secondary | ICD-10-CM | POA: Diagnosis not present

## 2023-06-17 DIAGNOSIS — R0609 Other forms of dyspnea: Secondary | ICD-10-CM | POA: Diagnosis not present

## 2023-06-17 DIAGNOSIS — I2510 Atherosclerotic heart disease of native coronary artery without angina pectoris: Secondary | ICD-10-CM

## 2023-06-17 DIAGNOSIS — I10 Essential (primary) hypertension: Secondary | ICD-10-CM

## 2023-06-17 DIAGNOSIS — E785 Hyperlipidemia, unspecified: Secondary | ICD-10-CM | POA: Diagnosis not present

## 2023-06-17 NOTE — Patient Instructions (Addendum)
 Medication Instructions:  Your physician recommends that you continue on your current medications as directed. Please refer to the Current Medication list given to you today.  Labwork: None   Testing/Procedures: Your physician has requested that you have an echocardiogram. Echocardiography is a painless test that uses sound waves to create images of your heart. It provides your doctor with information about the size and shape of your heart and how well your heart's chambers and valves are working. This procedure takes approximately one hour. There are no restrictions for this procedure. Please do NOT wear cologne, perfume, aftershave, or lotions (deodorant is allowed). Please arrive 15 minutes prior to your appointment time.  Please note: We ask at that you not bring children with you during ultrasound (echo/ vascular) testing. Due to room size and safety concerns, children are not allowed in the ultrasound rooms during exams. Our front office staff cannot provide observation of children in our lobby area while testing is being conducted. An adult accompanying a patient to their appointment will only be allowed in the ultrasound room at the discretion of the ultrasound technician under special circumstances. We apologize for any inconvenience.  Follow-Up: Your physician recommends that you schedule a follow-up appointment in: 3 months   Any Other Special Instructions Will Be Listed Below (If Applicable).  If you need a refill on your cardiac medications before your next appointment, please call your pharmacy.

## 2023-06-17 NOTE — Progress Notes (Unsigned)
 Cardiology Office Note:  .   Date: 06/17/2023 ID:  Shane Osborne, Shane Osborne [birth year], MRN [id number] PCP: Orlena Bitters, MD  [facility] Providers Cardiologist:  Teddie Favre, MD    History of Present Illness: .   Shane Osborne is a 83 y.o. male with a PMH of aortic valve stenosis, CAD, hyperlipidemia, who presents today for scheduled follow-up.  Last seen by Dr. Londa Rival on July 29, 2022.  He was overall doing well at the time.  Echocardiogram was updated that day and findings are noted below.  Dr. Londa Rival recommended to obtain follow-up echo in 6 months.  Today he presents for overdue 70-month follow-up appointment.  He states he is overall doing well. Says overall he feels the same to previous office visit but does admit to getting out of breath more easily while vacuuming or walking his dog a block. Denies any chest pain,  palpitations, syncope, presyncope, dizziness, orthopnea, PND, swelling or significant weight changes, acute bleeding, or claudication.   ROS: Negative. See HPI.   SH: His wife is also another patient of mine.   Studies Reviewed: Aaron Aas    EKG: EKG Interpretation Date/Time:  Thursday Jun 17 2023 15:42:39 EDT Ventricular Rate:  65 PR Interval:  152 QRS Duration:  86 QT Interval:  386 QTC Calculation: 401 R Axis:   31  Text Interpretation: Sinus rhythm with marked sinus arrhythmia When compared with ECG of 18-Oct-2020 08:54, PREVIOUS ECG IS PRESENT Confirmed by Lasalle Pointer [phone]) on 06/23/2023 7:34:25 PM   Echo 01/2023: 1. Left ventricular ejection fraction, by estimation, is 60 to 65%. The  left ventricle has normal function. The left ventricle has no regional  wall motion abnormalities. Left ventricular diastolic parameters are  consistent with Grade I diastolic  dysfunction (impaired relaxation). Elevated left ventricular end-diastolic  pressure.   2. Right ventricular systolic function is normal. The right ventricular  size is normal.   3. The  mitral valve is normal in structure. Trivial mitral valve  regurgitation. No evidence of mitral stenosis.   4. The aortic valve is calcified. There is moderate calcification of the  aortic valve. Aortic valve regurgitation is trivial. Moderate aortic valve  stenosis. Aortic valve area, by VTI measures 1.21 cm. Aortic valve mean  gradient measures 20.7 mmHg.  Aortic valve Vmax measures 3.03 m/s. DVI is 0.38.   5. Aortic dilatation noted. There is borderline dilatation of the aortic  root, measuring 38 mm. There is borderline dilatation of the ascending  aorta, measuring 36 mm.   Comparison(s): No significant change from prior study. EF 60-65%. Mean AoV  pg 22 mmHG.  Physical Exam:   VS:  BP 118/70   Pulse 67   Ht 5\' 9"  (1.753 m)   Wt 180 lb 6.4 oz (81.8 kg)   SpO2 96%   BMI 26.64 kg/m    Wt Readings from Last 3 Encounters:  06/17/23 180 lb 6.4 oz (81.8 kg)  04/22/23 183 lb 6.4 oz (83.2 kg)  01/05/23 177 lb (80.3 kg)    GEN: Well nourished, well developed in no acute distress NECK: No JVD; No carotid bruits CARDIAC: S1/S2, RRR, Grade 2-3/6 murmur, no rubs, no gallops RESPIRATORY:  Clear to auscultation without rales, wheezing or rhonchi  ABDOMEN: Soft, non-tender, non-distended EXTREMITIES:  No edema; No deformity   ASSESSMENT AND PLAN: .    Aortic valve stenosis, DOE TTE 01/2023 revealed moderate AS with mean gradient measuring 20.7 mmHg. Does admit to feeling more short of  breath quicker than previously. Will update Echo as he is near due for this to be updated as well as based on his recent symptoms. Care and ED precautions discussed.   CAD Cardiac cath in 2018 revealed non-obstructive CAD. Lexiscan  in 2022 was low risk. Denies any chest pain. No indication for ischemic evaluation at this time. Continue current medication regimen. Care and ED precautions discussed.   HLD LDL 1 year ago 56. Continue crestor . Heart healthy diet recommended. Follow-up with PCP.      Will  request most recent labs from PCP.   Dispo: Follow-up with me or APP in 3 months or sooner if anything changes.   Signed, Lasalle Pointer, NP

## 2023-07-14 ENCOUNTER — Ambulatory Visit: Attending: Nurse Practitioner

## 2023-07-14 DIAGNOSIS — I350 Nonrheumatic aortic (valve) stenosis: Secondary | ICD-10-CM | POA: Diagnosis not present

## 2023-07-14 DIAGNOSIS — R0609 Other forms of dyspnea: Secondary | ICD-10-CM | POA: Diagnosis not present

## 2023-07-15 LAB — ECHOCARDIOGRAM COMPLETE
AR max vel: 1.09 cm2
AV Area VTI: 1.03 cm2
AV Area mean vel: 1.08 cm2
AV Mean grad: 22 mmHg
AV Peak grad: 39.3 mmHg
Ao pk vel: 3.14 m/s
Area-P 1/2: 2.72 cm2
Calc EF: 57.4 %
MV VTI: 1.68 cm2
S' Lateral: 2.7 cm
Single Plane A2C EF: 59.8 %
Single Plane A4C EF: 53.3 %

## 2023-07-24 ENCOUNTER — Ambulatory Visit: Payer: Self-pay | Admitting: Nurse Practitioner

## 2023-08-10 DIAGNOSIS — Z79899 Other long term (current) drug therapy: Secondary | ICD-10-CM | POA: Diagnosis not present

## 2023-08-10 DIAGNOSIS — R5383 Other fatigue: Secondary | ICD-10-CM | POA: Diagnosis not present

## 2023-08-10 DIAGNOSIS — Z0000 Encounter for general adult medical examination without abnormal findings: Secondary | ICD-10-CM | POA: Diagnosis not present

## 2023-08-10 DIAGNOSIS — I10 Essential (primary) hypertension: Secondary | ICD-10-CM | POA: Diagnosis not present

## 2023-08-10 DIAGNOSIS — Z299 Encounter for prophylactic measures, unspecified: Secondary | ICD-10-CM | POA: Diagnosis not present

## 2023-08-10 DIAGNOSIS — E7800 Pure hypercholesterolemia, unspecified: Secondary | ICD-10-CM | POA: Diagnosis not present

## 2023-09-20 ENCOUNTER — Ambulatory Visit: Admitting: Nurse Practitioner

## 2023-10-04 ENCOUNTER — Encounter: Payer: Self-pay | Admitting: Nurse Practitioner

## 2023-10-04 ENCOUNTER — Ambulatory Visit: Attending: Nurse Practitioner | Admitting: Nurse Practitioner

## 2023-10-04 VITALS — BP 128/72 | HR 64 | Ht 69.0 in | Wt 186.0 lb

## 2023-10-04 DIAGNOSIS — I2510 Atherosclerotic heart disease of native coronary artery without angina pectoris: Secondary | ICD-10-CM

## 2023-10-04 DIAGNOSIS — I350 Nonrheumatic aortic (valve) stenosis: Secondary | ICD-10-CM | POA: Diagnosis not present

## 2023-10-04 DIAGNOSIS — R0609 Other forms of dyspnea: Secondary | ICD-10-CM

## 2023-10-04 DIAGNOSIS — E785 Hyperlipidemia, unspecified: Secondary | ICD-10-CM | POA: Diagnosis not present

## 2023-10-04 NOTE — Patient Instructions (Addendum)

## 2023-10-04 NOTE — Progress Notes (Signed)
 Cardiology Office Note:  .   Date: 10/04/2023 ID:  Macarius, Ruark 03-23-1940, MRN [id number] PCP: Rosamond Leta NOVAK, MD  [facility] Providers Cardiologist:  Jayson Sierras, MD    History of Present Illness: .   STILLMAN BUENGER is a 83 y.o. male with a PMH of aortic valve stenosis, CAD, hyperlipidemia, who presents today for scheduled follow-up.  Last seen by Dr. Sierras on July 29, 2022.  He was overall doing well at the time.  Echocardiogram was updated that day and findings are noted below.  Dr. Sierras recommended to obtain follow-up echo in 6 months.  06/17/2023 - Today he presents for overdue 39-month follow-up appointment.  He states he is overall doing well. Says overall he feels the same to previous office visit but does admit to getting out of breath more easily while vacuuming or walking his dog a block. Denies any chest pain,  palpitations, syncope, presyncope, dizziness, orthopnea, PND, swelling or significant weight changes, acute bleeding, or claudication.  10/04/2023 -he is here today for follow-up.  Overall doing well.  Says his breathing is stable since last office visit. Denies any chest pain, palpitations, syncope, presyncope, dizziness, orthopnea, PND, swelling or significant weight changes, acute bleeding, or claudication.   ROS: Negative. See HPI.   SH: His wife is also another patient of mine.   Studies Reviewed: SABRA    EKG: EKG is not ordered today.  Echo 06/2023:    1. Left ventricular ejection fraction, by estimation, is 60 to 65%. The  left ventricle has normal function. The left ventricle has no regional  wall motion abnormalities. Left ventricular diastolic parameters were  normal.   2. Right ventricular systolic function is normal. The right ventricular  size is normal.   3. The mitral valve is normal in structure. No evidence of mitral valve  regurgitation. No evidence of mitral stenosis.   4. The tricuspid valve is abnormal.   5. The aortic valve  is tricuspid. There is moderate calcification of the  aortic valve. There is moderate thickening of the aortic valve. Aortic  valve regurgitation is trivial. Moderate aortic valve stenosis. Aortic  valve area, by VTI measures 1.03 cm.  Aortic valve mean gradient measures 22.0 mmHg.   6. Aortic dilatation noted. There is mild dilatation of the ascending  aorta, measuring 36 mm.   Comparison(s): A prior study was performed on 02/10/2023. EF 60-65%.  Moderate aortic valve stenosis- mean pg 20.7 mmHg. Trivial AI.  Echo 01/2023: 1. Left ventricular ejection fraction, by estimation, is 60 to 65%. The  left ventricle has normal function. The left ventricle has no regional  wall motion abnormalities. Left ventricular diastolic parameters are  consistent with Grade I diastolic  dysfunction (impaired relaxation). Elevated left ventricular end-diastolic  pressure.   2. Right ventricular systolic function is normal. The right ventricular  size is normal.   3. The mitral valve is normal in structure. Trivial mitral valve  regurgitation. No evidence of mitral stenosis.   4. The aortic valve is calcified. There is moderate calcification of the  aortic valve. Aortic valve regurgitation is trivial. Moderate aortic valve  stenosis. Aortic valve area, by VTI measures 1.21 cm. Aortic valve mean  gradient measures 20.7 mmHg.  Aortic valve Vmax measures 3.03 m/s. DVI is 0.38.   5. Aortic dilatation noted. There is borderline dilatation of the aortic  root, measuring 38 mm. There is borderline dilatation of the ascending  aorta, measuring 36 mm.  Comparison(s): No significant change from prior study. EF 60-65%. Mean AoV  pg 22 mmHG.  Physical Exam:   VS:  BP 128/72   Pulse 64   Ht 5' 9 (1.753 m)   Wt 186 lb (84.4 kg)   SpO2 96%   BMI 27.47 kg/m    Wt Readings from Last 3 Encounters:  10/04/23 186 lb (84.4 kg)  06/17/23 180 lb 6.4 oz (81.8 kg)  04/22/23 183 lb 6.4 oz (83.2 kg)    GEN: Well  nourished, well developed in no acute distress NECK: No JVD; No carotid bruits CARDIAC: S1/S2, RRR, Grade 2-3/6 murmur, no rubs, no gallops RESPIRATORY:  Clear to auscultation without rales, wheezing or rhonchi  ABDOMEN: Soft, non-tender, non-distended EXTREMITIES:  No edema; No deformity   ASSESSMENT AND PLAN: .    Aortic valve stenosis, DOE Updated echo from June 2025 showed stable aortic valve stenosis with mean gradient measuring 22.0 mmHg.  Breathing is stable compared to last office visit.  If he develops more progressive symptoms, plan to refer to structural heart team for further evaluation.  Will continue to monitor. Care and ED precautions discussed.   CAD Cardiac cath in 2018 revealed non-obstructive CAD. Lexiscan  in 2022 was low risk. Denies any chest pain. No indication for ischemic evaluation at this time. Continue current medication regimen. Care and ED precautions discussed.   HLD Most recent LDL was at goal from July 7974.  Continue Crestor . Heart healthy diet recommended. Follow-up with PCP.   I spent a total duration of 20 minutes reviewing prior notes, reviewing outside records including  labs, face-to-face counseling of medical condition, pathophysiology, evaluation, management, and documenting the findings in the note.      Dispo: Follow-up with MD or APP in 6 months or sooner if anything changes.   Signed, Almarie Crate, NP

## 2023-11-01 ENCOUNTER — Other Ambulatory Visit: Payer: Self-pay | Admitting: Cardiology

## 2023-11-02 ENCOUNTER — Ambulatory Visit: Admitting: Nurse Practitioner

## 2024-04-04 ENCOUNTER — Ambulatory Visit: Admitting: Cardiology
